# Patient Record
Sex: Male | Born: 1965 | Hispanic: No | Marital: Married | Smoking: Former smoker
Health system: Southern US, Community
[De-identification: ages and names within clinical notes are randomized; demographics above are authoritative.]

## PROBLEM LIST (undated history)

## (undated) DIAGNOSIS — E785 Hyperlipidemia, unspecified: Secondary | ICD-10-CM

## (undated) DIAGNOSIS — K219 Gastro-esophageal reflux disease without esophagitis: Secondary | ICD-10-CM

## (undated) DIAGNOSIS — I10 Essential (primary) hypertension: Secondary | ICD-10-CM

## (undated) DIAGNOSIS — H269 Unspecified cataract: Secondary | ICD-10-CM

## (undated) HISTORY — DX: Hyperlipidemia, unspecified: E78.5

## (undated) HISTORY — DX: Essential (primary) hypertension: I10

## (undated) HISTORY — DX: Unspecified cataract: H26.9

## (undated) HISTORY — DX: Gastro-esophageal reflux disease without esophagitis: K21.9

---

## 1999-04-25 DIAGNOSIS — I10 Essential (primary) hypertension: Secondary | ICD-10-CM | POA: Insufficient documentation

## 2004-04-21 ENCOUNTER — Ambulatory Visit: Payer: Self-pay | Admitting: Internal Medicine

## 2004-05-21 ENCOUNTER — Ambulatory Visit: Payer: Self-pay | Admitting: Internal Medicine

## 2004-06-03 ENCOUNTER — Ambulatory Visit: Payer: Self-pay | Admitting: *Deleted

## 2004-06-18 ENCOUNTER — Ambulatory Visit: Payer: Self-pay | Admitting: Internal Medicine

## 2004-08-05 ENCOUNTER — Ambulatory Visit: Payer: Self-pay | Admitting: Internal Medicine

## 2004-10-24 ENCOUNTER — Ambulatory Visit: Payer: Self-pay | Admitting: Internal Medicine

## 2004-11-18 ENCOUNTER — Ambulatory Visit: Payer: Self-pay | Admitting: Internal Medicine

## 2004-12-02 ENCOUNTER — Ambulatory Visit: Payer: Self-pay | Admitting: Internal Medicine

## 2005-01-13 ENCOUNTER — Ambulatory Visit: Payer: Self-pay | Admitting: Internal Medicine

## 2005-01-27 ENCOUNTER — Ambulatory Visit: Payer: Self-pay | Admitting: Internal Medicine

## 2005-02-12 ENCOUNTER — Ambulatory Visit: Payer: Self-pay | Admitting: Family Medicine

## 2005-04-28 ENCOUNTER — Ambulatory Visit: Payer: Self-pay | Admitting: Internal Medicine

## 2005-10-16 ENCOUNTER — Ambulatory Visit: Payer: Self-pay | Admitting: Internal Medicine

## 2006-01-21 ENCOUNTER — Ambulatory Visit: Payer: Self-pay | Admitting: *Deleted

## 2006-04-19 ENCOUNTER — Ambulatory Visit: Payer: Self-pay | Admitting: Internal Medicine

## 2006-08-23 ENCOUNTER — Ambulatory Visit: Payer: Self-pay | Admitting: Family Medicine

## 2007-04-25 DIAGNOSIS — E782 Mixed hyperlipidemia: Secondary | ICD-10-CM | POA: Insufficient documentation

## 2007-04-25 DIAGNOSIS — E11319 Type 2 diabetes mellitus with unspecified diabetic retinopathy without macular edema: Secondary | ICD-10-CM | POA: Insufficient documentation

## 2007-04-25 DIAGNOSIS — E785 Hyperlipidemia, unspecified: Secondary | ICD-10-CM | POA: Insufficient documentation

## 2007-04-25 DIAGNOSIS — IMO0002 Reserved for concepts with insufficient information to code with codable children: Secondary | ICD-10-CM | POA: Insufficient documentation

## 2007-04-25 DIAGNOSIS — E1165 Type 2 diabetes mellitus with hyperglycemia: Secondary | ICD-10-CM

## 2011-05-27 ENCOUNTER — Encounter: Payer: Self-pay | Admitting: Internal Medicine

## 2011-05-27 ENCOUNTER — Ambulatory Visit (INDEPENDENT_AMBULATORY_CARE_PROVIDER_SITE_OTHER): Payer: PRIVATE HEALTH INSURANCE | Admitting: Internal Medicine

## 2011-05-27 DIAGNOSIS — E119 Type 2 diabetes mellitus without complications: Secondary | ICD-10-CM

## 2011-05-27 DIAGNOSIS — I10 Essential (primary) hypertension: Secondary | ICD-10-CM

## 2011-05-27 DIAGNOSIS — E785 Hyperlipidemia, unspecified: Secondary | ICD-10-CM

## 2011-05-27 MED ORDER — INSULIN ASPART PROT & ASPART (70-30 MIX) 100 UNIT/ML ~~LOC~~ SUSP: SUBCUTANEOUS | Status: DC

## 2011-05-27 MED ORDER — LISINOPRIL 10 MG PO TABS: 10.0000 mg | ORAL_TABLET | Freq: Every day | ORAL | Status: DC

## 2011-05-27 NOTE — Patient Instructions (Signed)
I want you to start taking 15 units of insulin before breakfast,  And 25 units before dinner.     Check you sugars fasting in the morning,  And 2 hours after your dinner meal..  Start lisinopril for your blood pressure,  1 tablet daily.  Please drop you labs by as soon as you can.  Return in one month.  We will adjust your insulin dose every week based on your previous weeks sugars.

## 2011-05-27 NOTE — Progress Notes (Signed)
  Subjective:    Patient ID: Randy Grant, male    DOB: 1965/09/04, 45 y.o.   MRN: [id number]  HPI Randy Grant is a 45 yo [place] male who is here to establish primary care.  He has a history of  DM, hyperlipidemia and HTN who has not taken any of his medications for the pas t6 months, for unclear reasons.  His chroncic conditions were previously managed by [facility],  But he states that his diabetes was not controlled despite his being prescribed insulin twice daily.  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  He states that he doesn't have time to attend diabetes classes  bc he works 4PM to midnight 5 days..   Past Medical History  Diagnosis Date  . Hyperlipidemia   . Hypertension   . Diabetes mellitus     No current outpatient prescriptions on file prior to visit.      Review of Systems  Constitutional: Positive for unexpected weight change. Negative for fever, chills, diaphoresis, activity change, appetite change and fatigue.  HENT: Negative for hearing loss, ear pain, nosebleeds, congestion, sore throat, facial swelling, rhinorrhea, sneezing, drooling, mouth sores, trouble swallowing, neck pain, neck stiffness, dental problem, voice change, postnasal drip, sinus pressure, tinnitus and ear discharge.   Eyes: Negative for photophobia, pain, discharge, redness, itching and visual disturbance.  Respiratory: Negative for apnea, cough, choking, chest tightness, shortness of breath, wheezing and stridor.   Cardiovascular: Negative for chest pain, palpitations and leg swelling.  Gastrointestinal: Negative for nausea, vomiting, abdominal pain, diarrhea, constipation, blood in stool, abdominal distention, anal bleeding and rectal pain.  Genitourinary: Negative for dysuria, urgency, frequency, hematuria, flank pain, decreased urine volume, scrotal swelling, difficulty urinating and testicular pain.  Musculoskeletal: Negative for  myalgias, back pain, joint swelling, arthralgias and gait problem.  Skin: Negative for color change, rash and wound.  Neurological: Negative for dizziness, tremors, seizures, syncope, speech difficulty, weakness, light-headedness, numbness and headaches.  Psychiatric/Behavioral: Negative for suicidal ideas, hallucinations, behavioral problems, confusion, sleep disturbance, dysphoric mood, decreased concentration and agitation. The patient is not nervous/anxious.        Objective:   Physical Exam  Constitutional: He is oriented to person, place, and time. Vital signs are normal. He appears well-developed and well-nourished.  HENT:  Head: Normocephalic and atraumatic.  Mouth/Throat: Oropharynx is clear and moist.  Eyes: Conjunctivae and EOM are normal.  Neck: Normal range of motion. Neck supple. No JVD present. No thyromegaly present.  Cardiovascular: Normal rate, regular rhythm and normal heart sounds.   Pulmonary/Chest: Effort normal and breath sounds normal. He has no wheezes. He has no rales.  Abdominal: Soft. Bowel sounds are normal. He exhibits no mass. There is no tenderness. There is no rebound.  Musculoskeletal: Normal range of motion. He exhibits no edema.  Neurological: He is alert and oriented to person, place, and time.  Skin: Skin is warm and dry.  Psychiatric: He has a normal mood and affect.          Assessment & Plan:

## 2011-05-30 ENCOUNTER — Encounter: Payer: Self-pay | Admitting: Internal Medicine

## 2011-05-30 NOTE — Assessment & Plan Note (Signed)
Mildly elevated.  Resume lisinopril 10 mg daily. Return for labs next week.

## 2011-05-30 NOTE — Assessment & Plan Note (Signed)
He will return for fasting labs,  Goal is LDL < 70

## 2011-05-30 NOTE — Assessment & Plan Note (Signed)
Currently uncontrolled due to lapse in medications. His random CBG in the office today was > 220, we will resume 70/30 insulin twice daily with Flexpen sample given.  He will return in one month and check sugars 2 to 3 times daily with titrations expected.

## 2011-06-01 ENCOUNTER — Other Ambulatory Visit: Payer: Self-pay | Admitting: Internal Medicine

## 2011-06-01 MED ORDER — INSULIN PEN NEEDLE 31G X 8 MM MISC: Status: DC

## 2011-06-01 MED ORDER — FREESTYLE LANCETS MISC: Status: AC

## 2011-06-18 ENCOUNTER — Ambulatory Visit: Payer: Self-pay | Admitting: Internal Medicine

## 2011-06-26 ENCOUNTER — Ambulatory Visit (INDEPENDENT_AMBULATORY_CARE_PROVIDER_SITE_OTHER): Payer: PRIVATE HEALTH INSURANCE | Admitting: Internal Medicine

## 2011-06-26 ENCOUNTER — Encounter: Payer: Self-pay | Admitting: Internal Medicine

## 2011-06-26 DIAGNOSIS — I10 Essential (primary) hypertension: Secondary | ICD-10-CM

## 2011-06-26 DIAGNOSIS — G589 Mononeuropathy, unspecified: Secondary | ICD-10-CM

## 2011-06-26 DIAGNOSIS — E119 Type 2 diabetes mellitus without complications: Secondary | ICD-10-CM

## 2011-06-26 DIAGNOSIS — E349 Endocrine disorder, unspecified: Secondary | ICD-10-CM

## 2011-06-26 MED ORDER — GLUCOSE BLOOD VI STRP: ORAL_STRIP | Status: AC

## 2011-06-26 NOTE — Progress Notes (Signed)
Subjective:    Patient ID: Randy Grant, male    DOB: 1966/06/14, 45 y.o.   MRN: [id number]  HPI  45 yo [place] male here to followup on uncontrolled diabetes. At his visit visit he had a random cbg of over 220  And we began 70/30 insulin  for hga1cb > 8.  He was instructed to check sugars 3 times daily and bring log to visit.   He had two hypoglycemic epsiodes bs dropped to 44) which  occurred around noon.  His symptoms were feeling hungry and sweaty.  He did not call or adjust his insulin.  He drank orange juice to restore his bs to normal.  He uses 15 units of 70/30 in the morning before breakfast and 25 units before dinner. His postprandial 11 pm sugars have been elevated to 20 and his predinner blood sugars have also been elevated.  He has [facility] and several knowledge gaps were identified.  His second issue is anxiety. The source of his anxiety is concern for his parents who did not emigrate from the [place] when he did and have required increasingly more financial assistance form him in order to survive.  He is having difficulty resting at night but does not want medication. He believes that his blood sugars are elevated because of his "stress"   Review of Systems  Constitutional: Positive for unexpected weight change. Negative for fever, chills, diaphoresis, activity change, appetite change and fatigue.  HENT: Negative for hearing loss, ear pain, nosebleeds, congestion, sore throat, facial swelling, rhinorrhea, sneezing, drooling, mouth sores, trouble swallowing, neck pain, neck stiffness, dental problem, voice change, postnasal drip, sinus pressure, tinnitus and ear discharge.   Eyes: Negative for photophobia, pain, discharge, redness, itching and visual disturbance.  Respiratory: Negative for apnea, cough, choking, chest tightness, shortness of breath, wheezing and stridor.   Cardiovascular: Negative for chest pain, palpitations and leg  swelling.  Gastrointestinal: Negative for nausea, vomiting, abdominal pain, diarrhea, constipation, blood in stool, abdominal distention, anal bleeding and rectal pain.  Genitourinary: Negative for dysuria, urgency, frequency, hematuria, flank pain, decreased urine volume, scrotal swelling, difficulty urinating and testicular pain.  Musculoskeletal: Negative for myalgias, back pain, joint swelling, arthralgias and gait problem.  Skin: Negative for color change, rash and wound.  Neurological: Negative for dizziness, tremors, seizures, syncope, speech difficulty, weakness, light-headedness, numbness and headaches.  Psychiatric/Behavioral: Negative for suicidal ideas, hallucinations, behavioral problems, confusion, sleep disturbance, dysphoric mood, decreased concentration and agitation. The patient is not nervous/anxious.        Objective:   Physical Exam  Constitutional: He is oriented to person, place, and time.  HENT:  Head: Normocephalic and atraumatic.  Mouth/Throat: Oropharynx is clear and moist.  Eyes: Conjunctivae and EOM are normal.  Neck: Normal range of motion. Neck supple. No JVD present. No thyromegaly present.  Cardiovascular: Normal rate, regular rhythm and normal heart sounds.   Pulmonary/Chest: Effort normal and breath sounds normal. He has no wheezes. He has no rales.  Abdominal: Soft. Bowel sounds are normal. He exhibits no mass. There is no tenderness. There is no rebound.  Musculoskeletal: Normal range of motion. He exhibits no edema.  Neurological: He is alert and oriented to person, place, and time.  Skin: Skin is warm and dry.  Psychiatric: He has a normal mood and affect.          Assessment & Plan:  DM:  He is attending Diabetes Education classes now.  Reviewed his  diet and his curent insulin regimen and recommended that he eat more frequently to avoid hypoglycemia from entering into fasting states.

## 2011-06-26 NOTE — Patient Instructions (Addendum)
Continue  the morning insulin dose  of 15 units unless your fasting glucose is < 130. If your sugar is < 130 use only 10 units of insulin before breakfast  Increase your evening dose of insulin to 30 units    Try going for a walk every day after one of your your big meals  We will repeat your labs in early January to see how your diabetes is being managed.    We will refer you for an eye exam   I have sent your test strips to [facility] pharmacy

## 2011-06-28 ENCOUNTER — Encounter: Payer: Self-pay | Admitting: Internal Medicine

## 2011-07-11 ENCOUNTER — Ambulatory Visit: Payer: Self-pay | Admitting: Internal Medicine

## 2011-07-29 ENCOUNTER — Ambulatory Visit: Payer: PRIVATE HEALTH INSURANCE | Admitting: Internal Medicine

## 2011-08-11 ENCOUNTER — Ambulatory Visit: Payer: Self-pay | Admitting: Internal Medicine

## 2011-08-12 ENCOUNTER — Other Ambulatory Visit: Payer: Self-pay | Admitting: Internal Medicine

## 2011-08-12 LAB — COMPREHENSIVE METABOLIC PANEL
Anion Gap: 9 (ref 7–16)
BUN: 12 mg/dL (ref 7–18)
Calcium, Total: 9 mg/dL (ref 8.5–10.1)
EGFR (African American): >60
EGFR (Non-African Amer.): >60
Glucose: 166 mg/dL — ABNORMAL HIGH (ref 65–99)
Osmolality: 287 (ref 275–301)
Potassium: 4 mmol/L (ref 3.5–5.1)
SGOT(AST): 20 U/L (ref 15–37)
Sodium: 142 mmol/L (ref 136–145)

## 2011-08-12 LAB — HEMOGLOBIN A1C: Hemoglobin A1C: 9.6 % — ABNORMAL HIGH (ref 4.2–6.3)

## 2011-08-14 ENCOUNTER — Ambulatory Visit: Payer: PRIVATE HEALTH INSURANCE | Admitting: Internal Medicine

## 2011-08-17 ENCOUNTER — Ambulatory Visit: Payer: PRIVATE HEALTH INSURANCE | Admitting: Internal Medicine

## 2011-08-17 ENCOUNTER — Telehealth: Payer: Self-pay | Admitting: Internal Medicine

## 2011-08-17 NOTE — Telephone Encounter (Signed)
Pt would like to know his lab result

## 2011-08-18 NOTE — Telephone Encounter (Signed)
We have no recent labs on this patient. I called [facility] to request most recent labs, but the most recent that they had was from October, I spoke to wife and she is going to find out where he had these labs done and call us back.

## 2011-08-19 ENCOUNTER — Telehealth: Payer: Self-pay | Admitting: Internal Medicine

## 2011-08-19 NOTE — Telephone Encounter (Signed)
Lab results from [facility] are in your red folder.

## 2011-08-19 NOTE — Telephone Encounter (Signed)
Those labs were from October .  He has had insulin changes and 2 office visits since then He needs repeat labsdone prior to next visit : fasting lipids, CMEt and HgbA1c .  These will need to be done every 3 months for diabetes management.

## 2011-08-20 NOTE — Telephone Encounter (Signed)
Pt says he had labs done this week, advised him we will call him when we get results.

## 2011-08-26 ENCOUNTER — Ambulatory Visit (INDEPENDENT_AMBULATORY_CARE_PROVIDER_SITE_OTHER): Payer: PRIVATE HEALTH INSURANCE | Admitting: Internal Medicine

## 2011-08-26 ENCOUNTER — Encounter: Payer: Self-pay | Admitting: Internal Medicine

## 2011-08-26 DIAGNOSIS — E1165 Type 2 diabetes mellitus with hyperglycemia: Secondary | ICD-10-CM

## 2011-08-26 DIAGNOSIS — IMO0002 Reserved for concepts with insufficient information to code with codable children: Secondary | ICD-10-CM

## 2011-08-26 DIAGNOSIS — I10 Essential (primary) hypertension: Secondary | ICD-10-CM

## 2011-08-26 DIAGNOSIS — E119 Type 2 diabetes mellitus without complications: Secondary | ICD-10-CM

## 2011-08-26 NOTE — Progress Notes (Signed)
Subjective:    Patient ID: Randy Grant, male    DOB: Aug 29, 1965, 46 y.o.   MRN: [id number]  HPI Randy Grant returns for one month 0diabetes checkup due to changes in medication at last visit.  His cbgs have decreased on the new regimen. On his current regimen of 70/30 insulin 15 units in the morning and 30 units in the evening pre meal, his fasting blood sugars have averaged between 130 and 150 his evening blood sugars have been averaging in the 120s to 150s.  He has not reported any recent hypoglycemic events. He is up-to-date on his diabetic eye exams and is actually scheduled for surgery for management of retinopathy in late January.  Past Medical History  Diagnosis Date  . Hyperlipidemia   . Hypertension   . Diabetes mellitus    Current Outpatient Prescriptions on File Prior to Visit  Medication Sig Dispense Refill  . aspirin 81 MG tablet Take 81 mg by mouth daily.        Marland Kitchen glucose blood test strip Use as instructed  100 each  12  . Insulin Pen Needle 31G X 8 MM MISC Test blood sugars two times daily  100 each  5  . Lancets (FREESTYLE) lancets Test blood sugars two times daily  100 each  5  . lisinopril (PRINIVIL,ZESTRIL) 10 MG tablet Take 1 tablet (10 mg total) by mouth daily.  30 tablet  11  . Multiple Vitamin (MULTIVITAMIN) tablet Take 1 tablet by mouth daily.          Review of Systems  Constitutional: Negative for fever, chills, diaphoresis, activity change, appetite change, fatigue and unexpected weight change.  HENT: Negative for hearing loss, ear pain, nosebleeds, congestion, sore throat, facial swelling, rhinorrhea, sneezing, drooling, mouth sores, trouble swallowing, neck pain, neck stiffness, dental problem, voice change, postnasal drip, sinus pressure, tinnitus and ear discharge.   Eyes: Negative for photophobia, pain, discharge, redness, itching and visual disturbance.  Respiratory: Negative for apnea, cough, choking, chest tightness, shortness of breath, wheezing  and stridor.   Cardiovascular: Negative for chest pain, palpitations and leg swelling.  Gastrointestinal: Negative for nausea, vomiting, abdominal pain, diarrhea, constipation, blood in stool, abdominal distention, anal bleeding and rectal pain.  Genitourinary: Negative for dysuria, urgency, frequency, hematuria, flank pain, decreased urine volume, scrotal swelling, difficulty urinating and testicular pain.  Musculoskeletal: Negative for myalgias, back pain, joint swelling, arthralgias and gait problem.  Skin: Negative for color change, rash and wound.  Neurological: Negative for dizziness, tremors, seizures, syncope, speech difficulty, weakness, light-headedness, numbness and headaches.  Psychiatric/Behavioral: Negative for suicidal ideas, hallucinations, behavioral problems, confusion, sleep disturbance, dysphoric mood, decreased concentration and agitation. The patient is not nervous/anxious.        Objective:   Physical Exam  Constitutional: He is oriented to person, place, and time.  HENT:  Head: Normocephalic and atraumatic.  Mouth/Throat: Oropharynx is clear and moist.  Eyes: Conjunctivae and EOM are normal.  Neck: Normal range of motion. Neck supple. No JVD present. No thyromegaly present.  Cardiovascular: Normal rate, regular rhythm and normal heart sounds.   Pulmonary/Chest: Effort normal and breath sounds normal. He has no wheezes. He has no rales.  Abdominal: Soft. Bowel sounds are normal. He exhibits no mass. There is no tenderness. There is no rebound.  Musculoskeletal: Normal range of motion. He exhibits no edema.  Neurological: He is alert and oriented to person, place, and time.  Skin: Skin is warm and dry.  Psychiatric: He has a normal  mood and affect.       Assessment & Plan:   Diabetes mellitus type 2 with complications, uncontrolled No changes were made today to his insulin regimen given his report of improving glycemic control. He is due for hemoglobin A1c and  this has been done at an outside facility and I am waiting for the results. He is receiving I care for new-onset retinopathy and is scheduled for surgery later on in the month. Fasting lipids are also do with next evaluation and urine microalbumin to creatinine ratio as well.  HYPERTENSION, BENIGN ESSENTIAL Well-controlled on current regimen of lisinopril. No changes today. He has normal renal function by outside labs dated October 2012.    Updated Medication List Outpatient Encounter Prescriptions as of 08/26/2011  Medication Sig Dispense Refill  . aspirin 81 MG tablet Take 81 mg by mouth daily.        Marland Kitchen glucose blood test strip Use as instructed  100 each  12  . Insulin Aspart Prot & Aspart (NOVOLOG MIX 70/30 FLEXPEN [place]) Inject 15 units am, 30 units pm.      . Insulin Pen Needle 31G X 8 MM MISC Test blood sugars two times daily  100 each  5  . Lancets (FREESTYLE) lancets Test blood sugars two times daily  100 each  5  . lisinopril (PRINIVIL,ZESTRIL) 10 MG tablet Take 1 tablet (10 mg total) by mouth daily.  30 tablet  11  . Multiple Vitamin (MULTIVITAMIN) tablet Take 1 tablet by mouth daily.

## 2011-08-26 NOTE — Patient Instructions (Addendum)
Continue 15 units of insulin in the morning and 30 units in the evening..   We will schedule your next appt in the morning in 3 months so you can get your labs done the same day   You are welcome to submit your blood sugar log every few weeks and I will review and offer advice.

## 2011-08-28 DIAGNOSIS — E118 Type 2 diabetes mellitus with unspecified complications: Secondary | ICD-10-CM | POA: Insufficient documentation

## 2011-08-28 DIAGNOSIS — IMO0002 Reserved for concepts with insufficient information to code with codable children: Secondary | ICD-10-CM | POA: Insufficient documentation

## 2011-08-28 DIAGNOSIS — E1165 Type 2 diabetes mellitus with hyperglycemia: Secondary | ICD-10-CM | POA: Insufficient documentation

## 2011-08-28 NOTE — Assessment & Plan Note (Signed)
No changes were made today to his insulin regimen given his report of improving glycemic control. He is due for hemoglobin A1c and this has been done at an outside facility and I am waiting for the results. He is receiving I care for new-onset retinopathy and is scheduled for surgery later on in the month. Fasting lipids are also do with next evaluation and urine microalbumin to creatinine ratio as well.

## 2011-08-28 NOTE — Assessment & Plan Note (Signed)
Well-controlled on current regimen of lisinopril. No changes today. He has normal renal function by outside labs dated October 2012.

## 2011-08-28 NOTE — Telephone Encounter (Signed)
Error

## 2011-09-11 ENCOUNTER — Ambulatory Visit: Payer: Self-pay | Admitting: Internal Medicine

## 2011-10-12 ENCOUNTER — Telehealth: Payer: Self-pay | Admitting: Internal Medicine

## 2011-10-12 NOTE — Telephone Encounter (Signed)
I have his blood sugar log up until Feb 4th but am still waiting for his January lab results which were done outside of [facility].  His hgba1c, CMet. ect   I cannot adjust his insulin regimen without it.  Can you please ask his wife or him where they were done so we can get them , or if they have a copy,  Bring it by?  thanks

## 2011-10-13 NOTE — Telephone Encounter (Signed)
Patients wife stated the labs were done at the hospital.  The labs have been requested.

## 2011-10-14 NOTE — Telephone Encounter (Signed)
hGBa1C AND BLOOD SUGAR LOG REVIEWED,.  Please confirm what time he takes his 70/30 doses and the dose because he works 3rd shift

## 2011-10-14 NOTE — Telephone Encounter (Signed)
The labs are in your green folder

## 2011-10-15 ENCOUNTER — Encounter: Payer: Self-pay | Admitting: Internal Medicine

## 2011-10-15 NOTE — Telephone Encounter (Signed)
Left message asking patient to call the office.  

## 2011-10-16 NOTE — Telephone Encounter (Signed)
Patient stated he takes his 70/30 dose at 1 am.

## 2011-10-19 NOTE — Telephone Encounter (Signed)
Have him increase his morning dose of 70/30 by 3 units

## 2011-10-20 NOTE — Telephone Encounter (Signed)
Patient notified

## 2011-10-22 ENCOUNTER — Ambulatory Visit: Payer: PRIVATE HEALTH INSURANCE | Admitting: Internal Medicine

## 2011-10-26 ENCOUNTER — Ambulatory Visit: Payer: PRIVATE HEALTH INSURANCE | Admitting: Internal Medicine

## 2011-10-27 ENCOUNTER — Ambulatory Visit (INDEPENDENT_AMBULATORY_CARE_PROVIDER_SITE_OTHER): Payer: PRIVATE HEALTH INSURANCE | Admitting: Internal Medicine

## 2011-10-27 ENCOUNTER — Encounter: Payer: Self-pay | Admitting: Internal Medicine

## 2011-10-27 VITALS — BP 120/80 | HR 80 | Temp 97.6°F | Wt 157.8 lb

## 2011-10-27 DIAGNOSIS — M7730 Calcaneal spur, unspecified foot: Secondary | ICD-10-CM

## 2011-10-27 DIAGNOSIS — M79609 Pain in unspecified limb: Secondary | ICD-10-CM

## 2011-10-27 DIAGNOSIS — M545 Low back pain, unspecified: Secondary | ICD-10-CM | POA: Insufficient documentation

## 2011-10-27 DIAGNOSIS — IMO0002 Reserved for concepts with insufficient information to code with codable children: Secondary | ICD-10-CM

## 2011-10-27 DIAGNOSIS — M79671 Pain in right foot: Secondary | ICD-10-CM | POA: Insufficient documentation

## 2011-10-27 DIAGNOSIS — S239XXA Sprain of unspecified parts of thorax, initial encounter: Secondary | ICD-10-CM

## 2011-10-27 MED ORDER — MELOXICAM 15 MG PO TABS: 15.0000 mg | ORAL_TABLET | Freq: Every day | ORAL | Status: DC

## 2011-10-27 NOTE — Assessment & Plan Note (Signed)
He has no evidence of herniated disc.  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 Initiate trial of tamoxifen and Tylenol and critical strenuous activities for the next week or 2.

## 2011-10-27 NOTE — Patient Instructions (Signed)
You have strained the muscles in your back .  This should resolve with use of an anti  inflammatory called meloxicam along with a pain reliever (Tylenol).  Take one meloxicam daily,  Take 2 tylenol every 12 hours as needed.    Avoid strenuous activity for one week.   Do not use ibuprofen ,  The meloxicam is taking ist place,   Your heel may have a bone  spur.  We are getting x rays at [facility] office of [place] on [address] [place].

## 2011-10-27 NOTE — Progress Notes (Signed)
Patient ID: Randy Grant, male   DOB: 06-29-1966, 46 y.o.   MRN: [id number]    Patient Active Problem List  Diagnoses  . Diabetes mellitus type 2 with complications, uncontrolled  . DYSLIPIDEMIA  . HYPERTENSION, BENIGN ESSENTIAL  . Right foot pain  . Back sprain/strain, thoracic    Subjective:  CC:   Chief Complaint  Patient presents with  . Foot Pain    right  . Back Pain    lower right     HPI:   Randy E Elhussainis a 46 y.o. male who presents with right heel pain for one week,  imporved with change of shoes and staying off feet .  Wears tennis shoes on a cement floor  In housekeeping .  No trauma.  No bite or infection .    Second issue is right thoracic back pain.  Has been persistent for one week.  Muscles feel tight ,  Pain with lateral rotation  but not elicited with bending or flexing.  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   pain does not radiate to either buttock and there are no areas of paresthesias or numbness. Pain did not precede an episode of coughing and .  He denies nausea vomiting increased urinary frequency and fevers.      The following portions of the patient's history were reviewed and updated as appropriate: Allergies, current medications, and problem list.  Past Medical History  Diagnosis Date  . Hyperlipidemia   . Hypertension   . Diabetes mellitus     No past surgical history on file.  Current Outpatient Prescriptions on File Prior to Visit  Medication Sig Dispense Refill  . aspirin 81 MG tablet Take 81 mg by mouth daily.        Marland Kitchen glucose blood test strip Use as instructed  100 each  12  . Insulin Aspart Prot & Aspart (NOVOLOG MIX 70/30 FLEXPEN [place]) Inject 15 units am, 30 units pm.      . Insulin Pen Needle 31G X 8 MM MISC Test blood sugars two times daily  100 each  5  . Lancets (FREESTYLE) lancets Test  blood sugars two times daily  100 each  5  . lisinopril (PRINIVIL,ZESTRIL) 10 MG tablet Take 1 tablet (10 mg total) by mouth daily.  30 tablet  11  . Multiple Vitamin (MULTIVITAMIN) tablet Take 1 tablet by mouth daily.           Review of Systems:  12 point review of systems is negative except what is addressed in the HPI,       Objective:  BP 120/80  Pulse 80  Temp(Src) 97.6 F (36.4 C) (Oral)  Wt 157 lb 12.8 oz (71.578 kg)  General:   BP 120/80  Pulse 80  Temp(Src) 97.6 F (36.4 C) (Oral)  Wt 157 lb 12.8 oz (71.578 kg)  General Appearance:    Alert, cooperative, no distress, appears stated age  Head:    Normocephalic, without obvious abnormality, atraumatic  Eyes:    PERRL, conjunctiva/corneas clear, EOM's intact, both eyes       Ears:    Normal TM's and external ear canals, both ears  Nose:   Nares normal, septum midline, mucosa normal, no drainage   or sinus tenderness  Throat:   Lips, mucosa, and tongue normal; teeth and gums normal  Neck:  Supple, symmetrical, trachea midline, no adenopathy;       thyroid:  No enlargement/tenderness/nodules; no carotid   bruit or JVD  Back:     Symmetric, no curvature, ROM normal, no CVA tenderness  Lungs:     Clear to auscultation bilaterally, respirations unlabored  Chest wall:    No tenderness or deformity  Heart:    Regular rate and rhythm, S1 and S2 normal, no murmur, rub   or gallop  Abdomen:     Soft, non-tender, bowel sounds active all four quadrants,    no masses, no organomegaly  Extremities:   Extremities normal, atraumatic, no cyanosis or edema  Pulses:   2+ and symmetric all extremities  Skin:   Skin color, texture, turgor normal, no rashes or lesions  Lymph nodes:   Cervical, supraclavicular, and axillary nodes normal  Neurologic:   CNII-XII intact. Normal strength, sensation and reflexes      throughout   Assessment and Plan:    Right foot pain He has no signs of tendinitis on exam. His exam is  relatively normal again there are no signs of synovitis or gouty flares. I suspect his pain is coming from a calcaneal bone spur and sent him for x-rays of the right heel and complete foot. Will also recommend a   Back sprain/strain, thoracic He has no evidence of herniated disc.  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 Initiate trial of tamoxifen and Tylenol and critical strenuous activities for the next week or 2.    Updated Medication List Outpatient Encounter Prescriptions as of 10/27/2011  Medication Sig Dispense Refill  . aspirin 81 MG tablet Take 81 mg by mouth daily.        Marland Kitchen glucose blood test strip Use as instructed  100 each  12  . Insulin Aspart Prot & Aspart (NOVOLOG MIX 70/30 FLEXPEN [place]) Inject 15 units am, 30 units pm.      . Insulin Pen Needle 31G X 8 MM MISC Test blood sugars two times daily  100 each  5  . Lancets (FREESTYLE) lancets Test blood sugars two times daily  100 each  5  . lisinopril (PRINIVIL,ZESTRIL) 10 MG tablet Take 1 tablet (10 mg total) by mouth daily.  30 tablet  11  . Multiple Vitamin (MULTIVITAMIN) tablet Take 1 tablet by mouth daily.        . meloxicam (MOBIC) 15 MG tablet Take 1 tablet (15 mg total) by mouth daily.  30 tablet  1    Orders Placed This Encounter  Procedures  . DG Foot Complete Right    No Follow-up on file.

## 2011-10-27 NOTE — Assessment & Plan Note (Signed)
He has no signs of tendinitis on exam. His exam is relatively normal again there are no signs of synovitis or gouty flares. I suspect his pain is coming from a calcaneal bone spur and sent him for x-rays of the right heel and complete foot. Will also recommend a

## 2011-11-11 ENCOUNTER — Ambulatory Visit (INDEPENDENT_AMBULATORY_CARE_PROVIDER_SITE_OTHER)
Admission: RE | Admit: 2011-11-11 | Discharge: 2011-11-11 | Disposition: A | Discharge status: Home / Self Care | Payer: PRIVATE HEALTH INSURANCE | Source: Ambulatory Visit | Attending: Internal Medicine | Admitting: Internal Medicine

## 2011-11-11 DIAGNOSIS — M7730 Calcaneal spur, unspecified foot: Secondary | ICD-10-CM

## 2011-11-18 ENCOUNTER — Encounter: Payer: Self-pay | Admitting: Internal Medicine

## 2011-11-23 ENCOUNTER — Telehealth: Payer: Self-pay | Admitting: *Deleted

## 2011-11-23 DIAGNOSIS — M7730 Calcaneal spur, unspecified foot: Secondary | ICD-10-CM

## 2011-11-23 NOTE — Telephone Encounter (Signed)
Referral

## 2011-11-25 ENCOUNTER — Other Ambulatory Visit (INDEPENDENT_AMBULATORY_CARE_PROVIDER_SITE_OTHER): Payer: PRIVATE HEALTH INSURANCE | Admitting: *Deleted

## 2011-11-25 ENCOUNTER — Telehealth: Payer: Self-pay | Admitting: *Deleted

## 2011-11-25 DIAGNOSIS — E785 Hyperlipidemia, unspecified: Secondary | ICD-10-CM

## 2011-11-25 DIAGNOSIS — I10 Essential (primary) hypertension: Secondary | ICD-10-CM

## 2011-11-25 DIAGNOSIS — E119 Type 2 diabetes mellitus without complications: Secondary | ICD-10-CM

## 2011-11-25 LAB — LIPID PANEL: Cholesterol: 260 mg/dL — ABNORMAL HIGH (ref 0–200)

## 2011-11-25 LAB — COMPREHENSIVE METABOLIC PANEL
Albumin: 3.8 g/dL (ref 3.5–5.2)
BUN: 13 mg/dL (ref 6–23)
CO2: 24 mEq/L (ref 19–32)
GFR: 144.65 mL/min (ref >60.00)
Glucose, Bld: 242 mg/dL — ABNORMAL HIGH (ref 70–99)
Potassium: 4.1 mEq/L (ref 3.5–5.1)
Sodium: 135 mEq/L (ref 135–145)
Total Bilirubin: 0.4 mg/dL (ref 0.3–1.2)
Total Protein: 6.8 g/dL (ref 6.0–8.3)

## 2011-11-25 NOTE — Telephone Encounter (Signed)
Labs ordered.

## 2011-11-25 NOTE — Telephone Encounter (Signed)
Hgba1c, CMEt, fasting lipids, .  thanks

## 2011-11-25 NOTE — Telephone Encounter (Signed)
Patient came in for labs today, but there is no order what would you like done?

## 2011-11-27 ENCOUNTER — Encounter: Payer: Self-pay | Admitting: *Deleted

## 2011-11-30 ENCOUNTER — Encounter: Payer: Self-pay | Admitting: Internal Medicine

## 2011-11-30 ENCOUNTER — Ambulatory Visit (INDEPENDENT_AMBULATORY_CARE_PROVIDER_SITE_OTHER): Payer: PRIVATE HEALTH INSURANCE | Admitting: Internal Medicine

## 2011-11-30 VITALS — BP 140/82 | HR 91 | Temp 98.3°F | Resp 16 | Wt 158.8 lb

## 2011-11-30 DIAGNOSIS — E785 Hyperlipidemia, unspecified: Secondary | ICD-10-CM

## 2011-11-30 DIAGNOSIS — E119 Type 2 diabetes mellitus without complications: Secondary | ICD-10-CM

## 2011-11-30 DIAGNOSIS — M79609 Pain in unspecified limb: Secondary | ICD-10-CM

## 2011-11-30 DIAGNOSIS — IMO0002 Reserved for concepts with insufficient information to code with codable children: Secondary | ICD-10-CM

## 2011-11-30 DIAGNOSIS — E1165 Type 2 diabetes mellitus with hyperglycemia: Secondary | ICD-10-CM

## 2011-11-30 DIAGNOSIS — M79671 Pain in right foot: Secondary | ICD-10-CM

## 2011-11-30 MED ORDER — INSULIN ASPART PROT & ASPART (70-30 MIX) 100 UNIT/ML ~~LOC~~ SUSP: SUBCUTANEOUS | Status: DC

## 2011-11-30 MED ORDER — INSULIN ASPART 100 UNIT/ML ~~LOC~~ SOLN: SUBCUTANEOUS | Status: DC

## 2011-11-30 NOTE — Assessment & Plan Note (Signed)
We are deferring medication management of his hyperlipidemia until the next three-month followup , as I  am hopeful that his lipids will improve once his diabetes is better managed.

## 2011-11-30 NOTE — Progress Notes (Signed)
Patient ID: Randy Grant, male   DOB: [birth year], 46 y.o.   MRN: [id number]   Patient Active Problem List  Diagnoses  . Diabetes mellitus type 2 with complications, uncontrolled  . DYSLIPIDEMIA  . HYPERTENSION, BENIGN ESSENTIAL  . Right foot pain  . Back sprain/strain, thoracic    Subjective:  CC:   Chief Complaint  Patient presents with  . Follow-up    HPI:   Randy Grant a 46 y.o. male who presents For follow up on uncontrolled diabetes.  His January hgba1c was 9,6 and repeat last week was 9.3 , despite patient reporting improvement in blood sugars.  He has been taking 15 untis of 70/30 in the morning and 30 units in the evening.  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  His second issue is his recent development of right heel pain secondary to bone spur, leading to an eval by Dr. Al Corpus.   He  received a steroid injection in heel,  and has followup this week. He reports that his heel pain is improved in the morning but becomes uncomfortable again by the end of the day due to prolonged standing and walking.  He did note that after his steroid injection her shirt his sugars were quite elevated at 300/400 range for a week.   Past Medical History  Diagnosis Date  . Hyperlipidemia   . Hypertension   . Diabetes mellitus     History reviewed. No pertinent past surgical history.       The following portions of the patient's history were reviewed and updated as appropriate: Allergies, current medications, and problem list.    Review of Systems:   12 Pt  review of systems was negative except those addressed in the HPI,     History   Social History  . Marital Status: Married    Spouse Name: N/A    Number of Children: N/A  . Years of Education: N/A   Occupational History    . Not on file.   Social History Main Topics  . Smoking status: Former Smoker    Quit date: 06/25/2001  . Smokeless tobacco: Never Used  . Alcohol Use: No  . Drug Use: No  . Sexually Active: Not on file   Other Topics Concern  . Not on file   Social History Narrative  . No narrative on file    Objective:  BP 140/82  Pulse 91  Temp(Src) 98.3 F (36.8 C) (Oral)  Resp 16  Wt 158 lb 12 oz (72.009 kg)  SpO2 100%  General appearance: alert, cooperative and appears stated age Ears: normal TM's and external ear canals both ears Throat: lips, mucosa, and tongue normal; teeth and gums normal Neck: no adenopathy, no carotid bruit, supple, symmetrical, trachea midline and thyroid not enlarged, symmetric, no tenderness/mass/nodules Back: symmetric, no curvature. ROM normal. No CVA tenderness. Lungs: clear to auscultation bilaterally Heart: regular rate and rhythm, S1, S2 normal, no murmur, click, rub or gallop Abdomen: soft, non-tender; bowel sounds normal; no masses,  no organomegaly Pulses: 2+ and symmetric Skin: Skin color, texture, turgor normal. No rashes or lesions Lymph nodes: Cervical, supraclavicular, and axillary nodes normal.  Assessment and Plan:  Diabetes mellitus type 2 with complications, uncontrolled His remains uncontrolled for multiple reasons. His recent elevation in sugars is attributed  to his recent steroid injection. However his diuretic he had stated his third shift work schedule has made using 7030 problematic. I have added NovoLog short acting insulin for him to use prior to his smaller meals and reduce his in his 70/30  doses slightly to avoid hypoglycemic events which he is having after prolonged fasts . He will return in one month with a log of his sugars.  30 minutes was spent with patient today reviewing his diabetes management.   DYSLIPIDEMIA We are deferring medication management of his hyperlipidemia until the next three-month followup , as I  am  hopeful that his lipids will improve once his diabetes is better managed.  Right foot pain Secondary to bone spur. Considerable relief after steroid injection by Dr. Al Corpus. However this did cause elevation of his blood sugars for at least a week. Have recommended that he ask Dr. Al Corpus for an orthotic or a gel insert for added comfort.    Updated Medication List Outpatient Encounter Prescriptions as of 11/30/2011  Medication Sig Dispense Refill  . aspirin 81 MG tablet Take 81 mg by mouth daily.        Marland Kitchen glucose blood test strip Use as instructed  100 each  12  . Insulin Aspart Prot & Aspart (NOVOLOG MIX 70/30 FLEXPEN [place]) Inject 15 units am, 30 units pm.      . Insulin Pen Needle 31G X 8 MM MISC Test blood sugars two times daily  100 each  5  . Lancets (FREESTYLE) lancets Test blood sugars two times daily  100 each  5  . lisinopril (PRINIVIL,ZESTRIL) 10 MG tablet Take 1 tablet (10 mg total) by mouth daily.  30 tablet  11  . Multiple Vitamin (MULTIVITAMIN) tablet Take 1 tablet by mouth daily.        . insulin aspart (NOVOLOG FLEXPEN) 100 UNIT/ML injection Inject before meal based on sliding scale  1 vial  12  . insulin aspart protamine-insulin aspart (NOVOLOG MIX 70/30 FLEXPEN) (70-30) 100 UNIT/ML injection 12units before mornign meal ,  25 before evening meal,  To be adjusted weekly.  10 mL  12  . DISCONTD: insulin aspart protamine-insulin aspart (NOVOLOG MIX 70/30 FLEXPEN) (70-30) 100 UNIT/ML injection 15 units before breakfast ,  25 before dinner,  To be adjusted weekly.  10 mL  12  . DISCONTD: meloxicam (MOBIC) 15 MG tablet Take 1 tablet (15 mg total) by mouth daily.  30 tablet  1     No orders of the defined types were placed in this encounter.    Return in about 1 month (around 12/30/2011).

## 2011-11-30 NOTE — Assessment & Plan Note (Addendum)
His remains uncontrolled for multiple reasons. His recent elevation in sugars is attributed to his recent steroid injection. However his diuretic he had stated his third shift work schedule has made using 7030 problematic. I have added NovoLog short acting insulin for him to use prior to his smaller meals and reduce his in his 70/30  doses slightly to avoid hypoglycemic events which he is having after prolonged fasts . He will return in one month with a log of his sugars.  30 minutes was spent with patient today reviewing his diabetes management.

## 2011-11-30 NOTE — Assessment & Plan Note (Signed)
Secondary to bone spur. Considerable relief after steroid injection by Dr. Al Corpus. However this did cause elevation of his blood sugars for at least a week. Have recommended that he ask Dr. Al Corpus for an orthotic or a gel insert for added comfort.

## 2011-11-30 NOTE — Patient Instructions (Addendum)
We are changing your insulin type to Novolog (short acting)  Before  your light meals using the following schedule:   Take 2 units if pre meal sugar is < 140.  Add 5 units if meal has bread, or pasta or potatoe or rice. (Inlcuded breading on meats/cutlets)  Take 4 units if pre meal sugars is 140 to 180  Take 6 units for pre meal sugar 181 to 220.   Decrease your  70/30 to 25 units before your biggest meal.     Decrease your morning fose of 70/30 to 12 units before breakfast  .

## 2011-12-30 ENCOUNTER — Encounter: Payer: Self-pay | Admitting: Internal Medicine

## 2011-12-30 ENCOUNTER — Ambulatory Visit (INDEPENDENT_AMBULATORY_CARE_PROVIDER_SITE_OTHER): Payer: PRIVATE HEALTH INSURANCE | Admitting: Internal Medicine

## 2011-12-30 VITALS — BP 124/72 | HR 79 | Temp 98.4°F | Resp 14 | Wt 156.5 lb

## 2011-12-30 DIAGNOSIS — E1165 Type 2 diabetes mellitus with hyperglycemia: Secondary | ICD-10-CM

## 2011-12-30 DIAGNOSIS — M79671 Pain in right foot: Secondary | ICD-10-CM

## 2011-12-30 DIAGNOSIS — IMO0002 Reserved for concepts with insufficient information to code with codable children: Secondary | ICD-10-CM

## 2011-12-30 DIAGNOSIS — M79609 Pain in unspecified limb: Secondary | ICD-10-CM

## 2011-12-30 DIAGNOSIS — E118 Type 2 diabetes mellitus with unspecified complications: Secondary | ICD-10-CM

## 2011-12-30 MED ORDER — TRAMADOL HCL 50 MG PO TABS: 50.0000 mg | ORAL_TABLET | Freq: Four times a day (QID) | ORAL | Status: AC | PRN

## 2011-12-30 NOTE — Patient Instructions (Addendum)
Increase the 70/30 dose before your biggest meal to 28 units.  And increase the morning dose  to 15 units.    Add a dose of the short acting insulin before your PM meal according to the sliding scale   We will add tramadol for foot pain,  50 mg every 6 hours as needed for pain

## 2011-12-30 NOTE — Progress Notes (Signed)
Patient ID: Randy Grant, male   DOB: Jan 31, 1966, 46 y.o.   MRN: [id number]  Patient Active Problem List  Diagnoses  . Diabetes mellitus type 2 with complications, uncontrolled  . DYSLIPIDEMIA  . HYPERTENSION, BENIGN ESSENTIAL  . Right foot pain  . Back sprain/strain, thoracic    Subjective:  CC:   Chief Complaint  Patient presents with  . Follow-up    HPI:   Randy Nealy Elhussainis a 46 y.o. male who presents follow up on uncontrolled diabetes.  For the last month he has been using 25 units of 70/30 before his largest meal and 12 of 70/30 before his smaller meal along with  low dose novolog for sliding scale in between.  His last A1cwas 9.3 his  lowest sugar was 86  But most are in the 170 range.  His 3rd shift work schedule is making control very difficult,  Comes home at 3 am and eats then,  And he will be observing  Ramadan from mid July to mid August, so he will be fasting from 3 am to sunset (13 hours daily).       Past Medical History  Diagnosis Date  . Hyperlipidemia   . Hypertension   . Diabetes mellitus     No past surgical history on file.       The following portions of the patient's history were reviewed and updated as appropriate: Allergies, current medications, and problem list.    Review of Systems:   12 Pt  review of systems was negative except those addressed in the HPI,     History   Social History  . Marital Status: Married    Spouse Name: N/A    Number of Children: N/A  . Years of Education: N/A   Occupational History  . Not on file.   Social History Main Topics  . Smoking status: Former Smoker    Quit date: 06/25/2001  . Smokeless tobacco: Never Used  . Alcohol Use: No  . Drug Use: No  . Sexually Active: Not on file   Other Topics Concern  . Not on file   Social History Narrative  . No narrative on file    Objective:  BP 124/72  Pulse 79  Temp(Src) 98.4 F (36.9 C) (Oral)  Resp 14  Wt 156 lb 8 oz (70.988 kg)   SpO2 97%  General appearance: alert, cooperative and appears stated age Ears: normal TM's and external ear canals both ears Throat: lips, mucosa, and tongue normal; teeth and gums normal Neck: no adenopathy, no carotid bruit, supple, symmetrical, trachea midline and thyroid not enlarged, symmetric, no tenderness/mass/nodules Back: symmetric, no curvature. ROM normal. No CVA tenderness. Lungs: clear to auscultation bilaterally Heart: regular rate and rhythm, S1, S2 normal, no murmur, click, rub or gallop Abdomen: soft, non-tender; bowel sounds normal; no masses,  no organomegaly Pulses: 2+ and symmetric Skin: Skin color, texture, turgor normal. No rashes or lesions Lymph nodes: Cervical, supraclavicular, and axillary nodes normal.  Assessment and Plan:  Diabetes mellitus type 2 with complications, uncontrolled Control reamin inadequate due to diet and eating schedule.  Today I advised him to increase the 70/30 dose before his biggest meal to 28 units.  And increase the morning dose  to 15 units. I also recommended that he add a dose of the short acting insulin before his small evening meal according to the sliding scale     Right foot pain Secondary to bone spur and fasciitis.  Did not respond  adequately to podiatry steroid injection. We will add tramadol for foot pain,  50 mg every 6 hours as needed for pain      Updated Medication List Outpatient Encounter Prescriptions as of 12/30/2011  Medication Sig Dispense Refill  . aspirin 81 MG tablet Take 81 mg by mouth daily.        Marland Kitchen glucose blood test strip Use as instructed  100 each  12  . insulin aspart (NOVOLOG FLEXPEN) 100 UNIT/ML injection Inject before meal based on sliding scale  1 vial  12  . insulin aspart protamine-insulin aspart (NOVOLOG MIX 70/30 FLEXPEN) (70-30) 100 UNIT/ML injection 12units before mornign meal ,  25 before evening meal,  To be adjusted weekly.  10 mL  12  . Insulin Pen Needle 31G X 8 MM MISC Test blood  sugars two times daily  100 each  5  . Lancets (FREESTYLE) lancets Test blood sugars two times daily  100 each  5  . lisinopril (PRINIVIL,ZESTRIL) 10 MG tablet Take 1 tablet (10 mg total) by mouth daily.  30 tablet  11  . Multiple Vitamin (MULTIVITAMIN) tablet Take 1 tablet by mouth daily.        . traMADol (ULTRAM) 50 MG tablet Take 1 tablet (50 mg total) by mouth every 6 (six) hours as needed for pain.  90 tablet  3  . DISCONTD: Insulin Aspart Prot & Aspart (NOVOLOG MIX 70/30 FLEXPEN [place]) Inject 15 units am, 30 units pm.         No orders of the defined types were placed in this encounter.    Return in about 1 day (around 12/31/2011).

## 2012-01-03 NOTE — Assessment & Plan Note (Signed)
Control reamin inadequate due to diet and eating schedule.  Today I advised him to increase the 70/30 dose before his biggest meal to 28 units.  And increase the morning dose  to 15 units. I also recommended that he add a dose of the short acting insulin before his small evening meal according to the sliding scale

## 2012-01-03 NOTE — Assessment & Plan Note (Signed)
Secondary to bone spur and fasciitis.  Did not respond adequately to podiatry steroid injection. We will add tramadol for foot pain,  50 mg every 6 hours as needed for pain

## 2012-02-10 ENCOUNTER — Encounter: Payer: Self-pay | Admitting: Internal Medicine

## 2012-02-10 ENCOUNTER — Ambulatory Visit (INDEPENDENT_AMBULATORY_CARE_PROVIDER_SITE_OTHER): Payer: PRIVATE HEALTH INSURANCE | Admitting: Internal Medicine

## 2012-02-10 VITALS — BP 120/72 | HR 100 | Temp 98.3°F | Resp 16 | Wt 156.5 lb

## 2012-02-10 DIAGNOSIS — E1165 Type 2 diabetes mellitus with hyperglycemia: Secondary | ICD-10-CM

## 2012-02-10 DIAGNOSIS — IMO0002 Reserved for concepts with insufficient information to code with codable children: Secondary | ICD-10-CM

## 2012-02-10 DIAGNOSIS — E118 Type 2 diabetes mellitus with unspecified complications: Secondary | ICD-10-CM

## 2012-02-10 MED ORDER — INSULIN GLARGINE 100 UNIT/ML ~~LOC~~ SOLN: SUBCUTANEOUS | Status: DC

## 2012-02-10 MED ORDER — INSULIN ASPART 100 UNIT/ML ~~LOC~~ SOLN: SUBCUTANEOUS | Status: DC

## 2012-02-10 NOTE — Patient Instructions (Addendum)
During Ramadan we will change your insulin to the following regimen:  30 units of lantus daily  5, 10, or 15 units of Novolog before your meal depending on'  Your pre meal blood sugar :   100 or less : 0 units   101 to 150:  5 units plus 5 if there is a starch,  0 if no starch in your meal  151 to 200:  8 units plus 5 if there is a starch, 0 of no starch in your meal  201 to 250:  10 units plus 5 or zero  (>250 but < 300,  Take 15 units plus 10 if there is a starch, 5 if no starch in your meal)  >300 but < 350 Take 15 units plus 10 if there is a starch, 5 if no starch

## 2012-02-10 NOTE — Progress Notes (Signed)
Patient ID: Randy Grant, male   DOB: 02-29-1966, 46 y.o.   MRN: [id number]   Patient Active Problem List  Diagnosis  . Diabetes mellitus type 2 with complications, uncontrolled  . DYSLIPIDEMIA  . HYPERTENSION, BENIGN ESSENTIAL  . Right foot pain  . Back sprain/strain, thoracic    Subjective:  CC:   Chief Complaint  Patient presents with  . Follow-up    HPI:   Randy Freer Elhussainis a 46 y.o. male who presents for follow up on uncontrolled diabetes.  We made changes to his insulin regimen at his last visit 6 weeks ago and he states that for the last dew weeks his blood sugars have improved.,  Typically  Ranging from 104  To  150 both fasting and preprandial.  However  yesterday he skipped his insulin  dose in the evening and this morning's sugar  was 200 . He is observing Ramadan for the ext month, which means he will be fasting from 4:30 am to sundown , and presents today for adjustment of insulin regimen.     Past Medical History  Diagnosis Date  . Hyperlipidemia   . Hypertension   . Diabetes mellitus     No past surgical history on file.   The following portions of the patient's history were reviewed and updated as appropriate: Allergies, current medications, and problem list.    Review of Systems:   12 Pt  review of systems was negative except those addressed in the HPI,     History   Social History  . Marital Status: Married    Spouse Name: N/A    Number of Children: N/A  . Years of Education: N/A   Occupational History  . Not on file.   Social History Main Topics  . Smoking status: Former Smoker    Quit date: 06/25/2001  . Smokeless tobacco: Never Used  . Alcohol Use: No  . Drug Use: No  . Sexually Active: Not on file   Other Topics Concern  . Not on file   Social History Narrative  . No narrative on file    Objective:  BP 120/72  Pulse 100  Temp 98.3 F (36.8 C) (Oral)  Resp 16  Wt 156 lb 8 oz (70.988 kg)  SpO2 97%  General  appearance: alert, cooperative and appears stated age Neck: no adenopathy, no carotid bruit, supple, symmetrical, trachea midline and thyroid not enlarged, symmetric, no tenderness/mass/nodules Back: symmetric, no curvature. ROM normal. No CVA tenderness. Lungs: clear to auscultation bilaterally Heart: regular rate and rhythm, S1, S2 normal, no murmur, click, rub or gallop Abdomen: soft, non-tender; bowel sounds normal; no masses,  no organomegaly Pulses: 2+ and symmetric Skin: Skin color, texture, turgor normal. No rashes or lesions Foot exam:  Nails are well trimmed,  No callouses,  Sensation intact to microfilament   Assessment and Plan:  Diabetes mellitus type 2 with complications, uncontrolled Slight improvement per April hgba1c of 9.3.  We are changing his regimen during Ramadan  to Lantuss 30 units daily and Novolin insulin before his evening meal, using a sliding scale. He understands the risks of hypoglycemic episodes and uncontrolled diabetes complicated by observation of Ramadan which includes avoiding even water until sunset.   Updated Medication List Outpatient Encounter Prescriptions as of 02/10/2012  Medication Sig Dispense Refill  . aspirin 81 MG tablet Take 81 mg by mouth daily.        Marland Kitchen glucose blood test strip Use as instructed  100 each  12  . insulin aspart (NOVOLOG FLEXPEN) 100 UNIT/ML injection Inject before meal based on sliding scale  1 vial  12  . insulin aspart protamine-insulin aspart (NOVOLOG MIX 70/30 FLEXPEN) (70-30) 100 UNIT/ML injection 12units before mornign meal ,  25 before evening meal,  To be adjusted weekly.  10 mL  12  . Insulin Pen Needle 31G X 8 MM MISC Test blood sugars two times daily  100 each  5  . Lancets (FREESTYLE) lancets Test blood sugars two times daily  100 each  5  . lisinopril (PRINIVIL,ZESTRIL) 10 MG tablet Take 1 tablet (10 mg total) by mouth daily.  30 tablet  11  . Multiple Vitamin (MULTIVITAMIN) tablet Take 1 tablet by mouth daily.         Marland Kitchen DISCONTD: insulin aspart (NOVOLOG FLEXPEN) 100 UNIT/ML injection Inject before meal based on sliding scale  1 vial  12  . insulin glargine (LANTUS SOLOSTAR) 100 UNIT/ML injection 30 units daily for use when fasting  5 pen  PRN  . DISCONTD: insulin glargine (LANTUS SOLOSTAR) 100 UNIT/ML injection 30 units daily for use when fasting  5 pen  PRN     No orders of the defined types were placed in this encounter.    Return in about 2 months (around 04/12/2012).

## 2012-02-13 NOTE — Assessment & Plan Note (Addendum)
Slight improvement per April hgba1c of 9.3.  We are changing his regimen during Ramadan  to Lantuss 30 units daily and Novolin insulin before his evening meal, using a sliding scale. He understands the risks of hypoglycemic episodes and uncontrolled diabetes complicated by observation of Ramadan which includes avoiding even water until sunset.

## 2012-02-26 ENCOUNTER — Other Ambulatory Visit: Payer: Self-pay | Admitting: *Deleted

## 2012-02-26 MED ORDER — INSULIN GLARGINE 100 UNIT/ML ~~LOC~~ SOLN: SUBCUTANEOUS | Status: DC

## 2012-02-26 NOTE — Telephone Encounter (Signed)
Pt needs refill of Lantus Solostar sent to [facility] pharmacy. He is out of sample that was given to him. Rx sent, pt informed.

## 2012-03-01 ENCOUNTER — Other Ambulatory Visit: Payer: Self-pay | Admitting: Internal Medicine

## 2012-03-01 MED ORDER — INSULIN GLARGINE 100 UNIT/ML ~~LOC~~ SOLN: SUBCUTANEOUS | Status: DC

## 2012-06-13 ENCOUNTER — Ambulatory Visit (INDEPENDENT_AMBULATORY_CARE_PROVIDER_SITE_OTHER): Payer: PRIVATE HEALTH INSURANCE | Admitting: Internal Medicine

## 2012-06-13 ENCOUNTER — Encounter: Payer: Self-pay | Admitting: Internal Medicine

## 2012-06-13 VITALS — BP 134/93 | HR 86 | Temp 97.5°F | Wt 156.5 lb

## 2012-06-13 DIAGNOSIS — E785 Hyperlipidemia, unspecified: Secondary | ICD-10-CM

## 2012-06-13 DIAGNOSIS — E1165 Type 2 diabetes mellitus with hyperglycemia: Secondary | ICD-10-CM

## 2012-06-13 DIAGNOSIS — E118 Type 2 diabetes mellitus with unspecified complications: Secondary | ICD-10-CM

## 2012-06-13 DIAGNOSIS — IMO0002 Reserved for concepts with insufficient information to code with codable children: Secondary | ICD-10-CM

## 2012-06-13 LAB — LDL CHOLESTEROL, DIRECT: Direct LDL: 218.6 mg/dL

## 2012-06-13 LAB — COMPREHENSIVE METABOLIC PANEL
CO2: 29 mEq/L (ref 19–32)
Calcium: 8.9 mg/dL (ref 8.4–10.5)
Chloride: 99 mEq/L (ref 96–112)
GFR: 125.72 mL/min (ref >60.00)
Glucose, Bld: 191 mg/dL — ABNORMAL HIGH (ref 70–99)
Sodium: 134 mEq/L — ABNORMAL LOW (ref 135–145)
Total Bilirubin: 0.4 mg/dL (ref 0.3–1.2)
Total Protein: 7.4 g/dL (ref 6.0–8.3)

## 2012-06-13 LAB — LIPID PANEL
Cholesterol: 333 mg/dL — ABNORMAL HIGH (ref 0–200)
VLDL: 77.8 mg/dL — ABNORMAL HIGH (ref 0.0–40.0)

## 2012-06-13 LAB — MICROALBUMIN / CREATININE URINE RATIO
Creatinine,U: 165.1 mg/dL
Microalb, Ur: 2.8 mg/dL — ABNORMAL HIGH (ref 0.0–1.9)

## 2012-06-13 MED ORDER — INSULIN ASPART PROT & ASPART (70-30 MIX) 100 UNIT/ML ~~LOC~~ SUSP: SUBCUTANEOUS | Status: DC

## 2012-06-13 MED ORDER — INSULIN GLARGINE 100 UNIT/ML ~~LOC~~ SOLN: SUBCUTANEOUS | Status: DC

## 2012-06-13 NOTE — Patient Instructions (Signed)
Increase the lantus to 35 units  And increase the short acting insulin to 15 units before your two largest meals.

## 2012-06-13 NOTE — Progress Notes (Signed)
Patient ID: Randy Grant, male   DOB: April 01, 1966, 46 y.o.   MRN: [id number]  Patient Active Problem List  Diagnosis  . Diabetes mellitus type 2 with complications, uncontrolled  . DYSLIPIDEMIA  . HYPERTENSION, BENIGN ESSENTIAL  . Right foot pain  . Back sprain/strain, thoracic    Subjective:  CC:   Chief Complaint  Patient presents with  . Follow-up    med refill    HPI:   Randy Grant a 46 y.o. male who presents for follow up on uncontrolled DM.  He was last seen in  July.4 adjustment in insulin regimen since he was entering a Ramadan for the month which would complicate his diabetes regimen due to daily fasting until some down. His last hemoglobin A1c had been improved but still very well elevated at 9.3. We decided that he would use 30 units of Lantus daily along with  sliding scale before any meals.  Currently  he states that his blood sugars have been high for the past couple of weeksbut did not bring his blood sugar log. He states that his postprandial blood sugars have been from 225 to 280 an his  fastings to 190. he still taking the, non-insulin schedule . He is unclear about his current schedule of insulin and has missed several doses. He does seem to take a dose of 70/30 before his largest meal at 7 PM but states he ran out of his insulin 2 weeks ago  .   Past Medical History  Diagnosis Date  . Hyperlipidemia   . Hypertension   . Diabetes mellitus     History reviewed. No pertinent past surgical history.  The following portions of the patient's history were reviewed and updated as appropriate: Allergies, current medications, and problem list.    Review of Systems:   12 Pt  review of systems was negative except those addressed in the HPI,     History   Social History  . Marital Status: Married    Spouse Name: N/A    Number of Children: N/A  . Years of Education: N/A   Occupational History  . Not on file.   Social History Main Topics  .  Smoking status: Former Smoker    Quit date: 06/25/2001  . Smokeless tobacco: Never Used  . Alcohol Use: No  . Drug Use: No  . Sexually Active: Not on file   Other Topics Concern  . Not on file   Social History Narrative  . No narrative on file    Objective:  BP 134/93  Pulse 86  Temp 97.5 F (36.4 C) (Oral)  Wt 156 lb 8 oz (70.988 kg)  SpO2 99%  General appearance: alert, cooperative and appears stated age Ears: normal TM's and external ear canals both ears Throat: lips, mucosa, and tongue normal; teeth and gums normal Neck: no adenopathy, no carotid bruit, supple, symmetrical, trachea midline and thyroid not enlarged, symmetric, no tenderness/mass/nodules Back: symmetric, no curvature. ROM normal. No CVA tenderness. Lungs: clear to auscultation bilaterally Heart: regular rate and rhythm, S1, S2 normal, no murmur, click, rub or gallop Abdomen: soft, non-tender; bowel sounds normal; no masses,  no organomegaly Pulses: 2+ and symmetric Skin: Skin color, texture, turgor normal. No rashes or lesions Lymph nodes: Cervical, supraclavicular, and axillary nodes normal. Foot exam:  Nails are well trimmed,  No callouses,  Sensation intact to microfilament  Assessment and Plan:  DYSLIPIDEMIA We are starting atorvastatin for management of hyperlipidemia. Current LDL is 2/200 and  his triglycerides are 389.  Diabetes mellitus type 2 with complications, uncontrolled He continues to have uncontrolled blood sugars. His hemoglobin A1c is 9.4 today. Control is problematic because he works third shift and his meals are a bit erratic. I have recommended that he increase the Lantus to 35 units and increase the 70/30insulin to 12 units before his morning meal and 25 units before his evening meal .  I'm recommending that he see an endocrinologist for assistance in getting his sugars under control. referral to Dr. Renae Fickle Under way. Continue lisinopril for early microalbuminemia and continue baby  aspirin. Statin added for uncontrolled hyperlipidemia.   Updated Medication List Outpatient Encounter Prescriptions as of 06/13/2012  Medication Sig Dispense Refill  . aspirin 81 MG tablet Take 81 mg by mouth daily.        Marland Kitchen atorvastatin (LIPITOR) 20 MG tablet Take 1 tablet (20 mg total) by mouth daily.  90 tablet  3  . glucose blood test strip Use as instructed  100 each  12  . insulin aspart (NOVOLOG FLEXPEN) 100 UNIT/ML injection Inject before meal based on sliding scale  1 vial  12  . insulin aspart protamine-insulin aspart (NOVOLOG MIX 70/30 FLEXPEN) (70-30) 100 UNIT/ML injection 12  units before morning meal ,  25 before evening meal,  To be adjusted weekly.  10 mL  12  . insulin glargine (LANTUS SOLOSTAR) 100 UNIT/ML injection 35 units daily for use when fasting  15 mL  2  . Insulin Pen Needle 31G X 8 MM MISC Test blood sugars two times daily  100 each  5  . lisinopril (PRINIVIL,ZESTRIL) 10 MG tablet Take 1 tablet (10 mg total) by mouth daily.  30 tablet  11  . Multiple Vitamin (MULTIVITAMIN) tablet Take 1 tablet by mouth daily.        . [DISCONTINUED] insulin aspart protamine-insulin aspart (NOVOLOG MIX 70/30 FLEXPEN) (70-30) 100 UNIT/ML injection 12units before mornign meal ,  25 before evening meal,  To be adjusted weekly.  10 mL  12  . [DISCONTINUED] insulin glargine (LANTUS SOLOSTAR) 100 UNIT/ML injection 30 units daily for use when fasting  15 mL  2     Orders Placed This Encounter  Procedures  . Comprehensive metabolic panel  . Lipid panel  . Hemoglobin A1c  . Microalbumin / creatinine urine ratio  . LDL cholesterol, direct  . Ambulatory referral to Endocrinology    Return in about 3 months (around 09/13/2012).

## 2012-06-15 MED ORDER — ATORVASTATIN CALCIUM 20 MG PO TABS: 20.0000 mg | ORAL_TABLET | Freq: Every day | ORAL | Status: DC

## 2012-06-15 NOTE — Assessment & Plan Note (Signed)
We are starting atorvastatin for management of hyperlipidemia. Current LDL is 2/200 and his triglycerides are 389.

## 2012-06-15 NOTE — Assessment & Plan Note (Addendum)
He continues to have uncontrolled blood sugars. His hemoglobin A1c is 9.4 today. Control is problematic because he works third shift and his meals are a bit erratic. I have recommended that he increase the Lantus to 35 units and increase the 70/30insulin to 12 units before his morning meal and 25 units before his evening meal .  I'm recommending that he see an endocrinologist for assistance in getting his sugars under control. referral to Dr. Renae Fickle Under way. Continue lisinopril for early microalbuminemia and continue baby aspirin. Statin added for uncontrolled hyperlipidemia.

## 2012-07-25 ENCOUNTER — Telehealth: Payer: Self-pay | Admitting: Internal Medicine

## 2012-07-25 DIAGNOSIS — I10 Essential (primary) hypertension: Secondary | ICD-10-CM

## 2012-07-25 MED ORDER — INSULIN PEN NEEDLE 31G X 8 MM MISC: Status: DC

## 2012-07-25 MED ORDER — LISINOPRIL 10 MG PO TABS: 10.0000 mg | ORAL_TABLET | Freq: Every day | ORAL | Status: DC

## 2012-07-25 NOTE — Telephone Encounter (Signed)
Refill - Pharmacy - [facility] Fax #[phone] Drug Name- BD Pen Needle Short 31GX5 Directions- Use as directed  Quantity- 100 DIS

## 2012-07-25 NOTE — Telephone Encounter (Signed)
Lisinopril 10 mg and insulin pen needle sent to [facility]

## 2012-07-25 NOTE — Telephone Encounter (Signed)
Pt came in today stating he needs refill Needles for his pen lotus High bp meds Test strip [facility]  Pt would like you to send refill on any meds that needs refilled.  Offered him appointment 08/11/12

## 2012-08-17 ENCOUNTER — Other Ambulatory Visit: Payer: Self-pay | Admitting: Internal Medicine

## 2012-08-17 NOTE — Telephone Encounter (Signed)
glucose blood test strip ( One Touch Ultra )  100  Use as instructed

## 2012-08-18 MED ORDER — GLUCOSE BLOOD VI STRP: ORAL_STRIP | Status: DC

## 2012-08-18 NOTE — Telephone Encounter (Signed)
Med filled.  

## 2012-11-23 ENCOUNTER — Other Ambulatory Visit: Payer: Self-pay | Admitting: *Deleted

## 2012-11-23 NOTE — Telephone Encounter (Signed)
Patient called office requesting refills on medications. Patient states he has not been able to go see specialist you recommended. See office notes 06/13/12. Is it okay to refill?

## 2012-11-24 MED ORDER — INSULIN GLARGINE 100 UNIT/ML ~~LOC~~ SOLN: SUBCUTANEOUS | Status: DC

## 2012-11-24 MED ORDER — INSULIN PEN NEEDLE 31G X 8 MM MISC: Status: DC

## 2012-11-24 NOTE — Telephone Encounter (Signed)
Ok to refill,  Authorized in [employer].  But patient needs to make a follow up with me ASAP then for diabetes  .  Please get him scheduled.  thanks

## 2012-11-24 NOTE — Telephone Encounter (Signed)
Called into pharmacy as requested 

## 2012-12-08 ENCOUNTER — Encounter: Payer: Self-pay | Admitting: Internal Medicine

## 2012-12-08 ENCOUNTER — Ambulatory Visit (INDEPENDENT_AMBULATORY_CARE_PROVIDER_SITE_OTHER): Payer: 59 | Admitting: Internal Medicine

## 2012-12-08 ENCOUNTER — Telehealth: Payer: Self-pay | Admitting: Internal Medicine

## 2012-12-08 VITALS — BP 112/72 | HR 79 | Temp 97.9°F | Resp 16 | Wt 157.2 lb

## 2012-12-08 DIAGNOSIS — IMO0002 Reserved for concepts with insufficient information to code with codable children: Secondary | ICD-10-CM

## 2012-12-08 DIAGNOSIS — E785 Hyperlipidemia, unspecified: Secondary | ICD-10-CM

## 2012-12-08 DIAGNOSIS — Z125 Encounter for screening for malignant neoplasm of prostate: Secondary | ICD-10-CM

## 2012-12-08 DIAGNOSIS — R350 Frequency of micturition: Secondary | ICD-10-CM

## 2012-12-08 DIAGNOSIS — E1165 Type 2 diabetes mellitus with hyperglycemia: Secondary | ICD-10-CM

## 2012-12-08 DIAGNOSIS — E118 Type 2 diabetes mellitus with unspecified complications: Secondary | ICD-10-CM

## 2012-12-08 LAB — COMPREHENSIVE METABOLIC PANEL
Alkaline Phosphatase: 65 U/L (ref 39–117)
BUN: 12 mg/dL (ref 6–23)
Creatinine, Ser: 0.6 mg/dL (ref 0.4–1.5)
GFR: 141.43 mL/min (ref >60.00)
Glucose, Bld: 247 mg/dL — ABNORMAL HIGH (ref 70–99)
Total Bilirubin: 0.6 mg/dL (ref 0.3–1.2)

## 2012-12-08 LAB — MICROALBUMIN / CREATININE URINE RATIO
Creatinine,U: 127.9 mg/dL
Microalb Creat Ratio: 1.2 mg/g (ref 0.0–30.0)
Microalb, Ur: 1.5 mg/dL (ref 0.0–1.9)

## 2012-12-08 LAB — URINALYSIS
Bilirubin Urine: NEGATIVE
Hgb urine dipstick: NEGATIVE
Ketones, ur: NEGATIVE
Leukocytes, UA: NEGATIVE
Nitrite: NEGATIVE
Specific Gravity, Urine: 1.025
Total Protein, Urine: NEGATIVE
Urine Glucose: NEGATIVE
Urobilinogen, UA: 0.2
pH: 6 (ref 5.0–8.0)

## 2012-12-08 LAB — LDL CHOLESTEROL, DIRECT: Direct LDL: 111.6 mg/dL

## 2012-12-08 LAB — PSA: PSA: 0.56 ng/mL (ref 0.10–4.00)

## 2012-12-08 MED ORDER — INSULIN ASPART PROT & ASPART (70-30 MIX) 100 UNIT/ML ~~LOC~~ SUSP: SUBCUTANEOUS | Status: DC

## 2012-12-08 NOTE — Telephone Encounter (Signed)
Randy Grant ,  Can you add the A1c that I left off of lab order this morning?

## 2012-12-08 NOTE — Telephone Encounter (Signed)
Please ask patient to return at his leisure tomorrow or next week for one more blood test that was left off today. He will not be charged for the additional draw, just the test itself

## 2012-12-08 NOTE — Patient Instructions (Addendum)
We are going to resume your 70/30 insulin to take,  15 units before your 1 PM meal   Continue the Lantus 35 units  Return in 3 months

## 2012-12-08 NOTE — Telephone Encounter (Signed)
A lavender top wasn't draw .Marland Kitchen...sorry

## 2012-12-08 NOTE — Progress Notes (Signed)
Patient ID: Randy Grant, male   DOB: 04-23-1966, 47 y.o.   MRN: [id number]   Patient Active Problem List   Diagnosis Date Noted  . Right foot pain 10/27/2011  . Back sprain/strain, thoracic 10/27/2011  . Diabetes mellitus type 2 with complications, uncontrolled 04/25/2007  . DYSLIPIDEMIA 04/25/2007  . HYPERTENSION, BENIGN ESSENTIAL 04/25/1999    Subjective:  CC:   Chief Complaint  Patient presents with  . Follow-up    HPI:   Randy Grant a 47 y.o. male who presents 4 month follow up on uncontrolled diabetes .  He was referred to [facility] Dr. Renae Fickle but missed both of 2 appts, so she will not see him.  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  Marland Kitchen  His blood sugars have been > 200  mostly.  He did not bring a log of his blood sugars and states that most of his fasting blood sugars have been from 80 to 110.  How often are post prandials above 150? 50% of the time..  Checks 1 to 2 times daily.  Takes insulin at  1 pm, then eats, and checks sugar at 3 pm.  Has only been using lantus 35 units   No lows.,  Does not exercise.  weight is stable.    Past Medical History  Diagnosis Date  . Hyperlipidemia   . Hypertension   . Diabetes mellitus     History reviewed. No pertinent past surgical history.     The following portions of the patient's history were reviewed and updated as appropriate: Allergies, current medications, and problem list.    Review of Systems:  Patient denies headache, fevers, malaise, unintentional weight loss, skin rash, eye pain, sinus congestion and sinus pain, sore throat, dysphagia,  hemoptysis , cough, dyspnea, wheezing, chest pain, palpitations, orthopnea, edema, abdominal pain, nausea, melena, diarrhea, constipation, flank pain, dysuria, hematuria, urinary  Frequency, nocturia, numbness, tingling, seizures,  Focal weakness, Loss of consciousness,   Tremor, insomnia, depression, anxiety, and suicidal ideation.      History   Social History  . Marital Status: Married    Spouse Name: N/A    Number of Children: N/A  . Years of Education: N/A   Occupational History  . Not on file.   Social History Main Topics  . Smoking status: Former Smoker    Quit date: 06/25/2001  . Smokeless tobacco: Never Used  . Alcohol Use: No  . Drug Use: No  . Sexually Active: Not on file   Other Topics Concern  . Not on file   Social History Narrative  . No narrative on file    Objective:  BP 112/72  Pulse 79  Temp(Src) 97.9 F (36.6 C) (Oral)  Resp 16  Wt 157 lb 4 oz (71.328 kg)  BMI 25.39 kg/m2  SpO2 99%  General appearance: alert, cooperative and appears stated age Ears: normal TM's and external ear canals both ears Throat: lips, mucosa, and tongue normal; teeth and gums normal Neck: no adenopathy, no carotid bruit, supple, symmetrical, trachea midline and thyroid not enlarged, symmetric, no tenderness/mass/nodules Back: symmetric, no curvature. ROM normal. No CVA tenderness. Lungs: clear to auscultation bilaterally Heart: regular rate and rhythm, S1, S2 normal, no murmur, click, rub or gallop Abdomen: soft, non-tender; bowel sounds normal; no masses,  no organomegaly Pulses: 2+ and symmetric Skin: Skin color, texture, turgor normal. No  rashes or lesions Lymph nodes: Cervical, supraclavicular, and axillary nodes normal.  Assessment and Plan: Diabetes mellitus type 2 with complications, uncontrolled A1c in November was 9.3  On Lantus alone.  Difficult to control due to patient's work schedule , unusual eating habtis, frequent fasts due to religious practices,  And lack of hard data due to patient never bringing his log.  Will continue lantus at 35 units dailoy and  Resume use  70/30 Novolog prior to his 1 pm meal, his largest meal.  Starting with 15 units..  Encouraged to use Mychart to submit blood sugars between visits. Reminder  for annual diabetic eye exam given..  Foot exam done last visit. Meds reviewed and he is on a baby aspirin daily., a statin and an ACE inhibitor.  Urine was negative  for protein.    DYSLIPIDEMIA He is not fasting today so only an LDL was done,  With improvement from 220 to 129 with atorvastatin 20 mg.  Will increase to 40 mg next refill.   Screening for prostate cancer PSA was ordered as this has not been done  Before and was normal at 0.56   Updated Medication List Outpatient Encounter Prescriptions as of 12/08/2012  Medication Sig Dispense Refill  . aspirin 81 MG tablet Take 81 mg by mouth daily.        Marland Kitchen atorvastatin (LIPITOR) 40 MG tablet Take 1 tablet (40 mg total) by mouth daily.  90 tablet  3  . glucose blood (ONE TOUCH ULTRA TEST) test strip Use as instructed  100 each  12  . glucose blood test strip 1 each by Other route as needed. To test glucose levels.      . insulin glargine (LANTUS SOLOSTAR) 100 UNIT/ML injection 35 units daily for use when fasting  15 mL  11  . Insulin Pen Needle 31G X 8 MM MISC Test blood sugars two times daily  100 each  11  . lisinopril (PRINIVIL,ZESTRIL) 10 MG tablet Take 1 tablet (10 mg total) by mouth daily.  30 tablet  11  . Multiple Vitamin (MULTIVITAMIN) tablet Take 1 tablet by mouth daily.        . [DISCONTINUED] atorvastatin (LIPITOR) 20 MG tablet Take 1 tablet (20 mg total) by mouth daily.  90 tablet  3  . insulin aspart (NOVOLOG FLEXPEN) 100 UNIT/ML injection Inject before meal based on sliding scale  1 vial  12  . insulin aspart protamine- aspart (NOVOLOG 70/30) (70-30) 100 UNIT/ML injection 15 units daily before your meal  10 mL  12  . [DISCONTINUED] insulin aspart protamine-insulin aspart (NOVOLOG MIX 70/30 FLEXPEN) (70-30) 100 UNIT/ML injection 12  units before morning meal ,  25 before evening meal,  To be adjusted weekly.  10 mL  12   No facility-administered encounter medications on file as of 12/08/2012.

## 2012-12-09 NOTE — Telephone Encounter (Signed)
Spoke to pt, has an appointment for the labs Monday 05.05.2015 at 10:30 to get his A1c done

## 2012-12-10 ENCOUNTER — Encounter: Payer: Self-pay | Admitting: Internal Medicine

## 2012-12-10 DIAGNOSIS — Z125 Encounter for screening for malignant neoplasm of prostate: Secondary | ICD-10-CM | POA: Insufficient documentation

## 2012-12-10 MED ORDER — ATORVASTATIN CALCIUM 40 MG PO TABS: 40.0000 mg | ORAL_TABLET | Freq: Every day | ORAL | Status: DC

## 2012-12-10 NOTE — Assessment & Plan Note (Addendum)
A1c in November was 9.3  On Lantus alone.  Difficult to control due to patient's work schedule , unusual eating habtis, frequent fasts due to religious practices,  And lack of hard data due to patient never bringing his log.  Will continue lantus at 35 units dailoy and  Resume use  70/30 Novolog prior to his 1 pm meal, his largest meal.  Starting with 15 units..  Encouraged to use Mychart to submit blood sugars between visits. Reminder for annual diabetic eye exam given..  Foot exam done last visit. Meds reviewed and he is on a baby aspirin daily., a statin and an ACE inhibitor.  Urine was negative  for protein.

## 2012-12-10 NOTE — Assessment & Plan Note (Addendum)
PSA was ordered as this has not been done  Before and was normal at 0.56

## 2012-12-10 NOTE — Assessment & Plan Note (Signed)
He is not fasting today so only an LDL was done,  With improvement from 220 to 129 with atorvastatin 20 mg.  Will increase to 40 mg next refill.

## 2012-12-12 ENCOUNTER — Other Ambulatory Visit (INDEPENDENT_AMBULATORY_CARE_PROVIDER_SITE_OTHER): Payer: 59

## 2012-12-12 DIAGNOSIS — E118 Type 2 diabetes mellitus with unspecified complications: Secondary | ICD-10-CM

## 2012-12-12 DIAGNOSIS — IMO0002 Reserved for concepts with insufficient information to code with codable children: Secondary | ICD-10-CM

## 2012-12-12 DIAGNOSIS — E1165 Type 2 diabetes mellitus with hyperglycemia: Secondary | ICD-10-CM

## 2012-12-12 LAB — HEMOGLOBIN A1C: Hgb A1c MFr Bld: 10.7 % — ABNORMAL HIGH (ref 4.6–6.5)

## 2012-12-13 NOTE — Progress Notes (Signed)
Detailed message left with wife.

## 2012-12-13 NOTE — Addendum Note (Signed)
Addended by: Sherlene Shams on: 12/13/2012 12:47 PM   Modules accepted: Orders

## 2012-12-14 ENCOUNTER — Telehealth: Payer: Self-pay | Admitting: Internal Medicine

## 2012-12-14 NOTE — Telephone Encounter (Signed)
Patient left voice mail asking you to return his call.  He didn't say what it was in reference to.

## 2012-12-14 NOTE — Telephone Encounter (Signed)
Left message on voicemail for patient to call office. 

## 2012-12-15 NOTE — Telephone Encounter (Signed)
Left detailed message with patients wife per DPR as to patient having endocrinology appointment.

## 2012-12-21 ENCOUNTER — Telehealth: Payer: Self-pay | Admitting: Emergency Medicine

## 2012-12-21 NOTE — Telephone Encounter (Signed)
Pt returned call [id number].  Does think that he will be able to keep the 01/18/13 11am appt with Dr. Elvera Lennox.    Best number to call patient if needed, [phone].

## 2013-01-03 ENCOUNTER — Telehealth: Payer: Self-pay | Admitting: Internal Medicine

## 2013-01-03 MED ORDER — INSULIN ASPART 100 UNIT/ML ~~LOC~~ SOLN: SUBCUTANEOUS | Status: DC

## 2013-01-03 NOTE — Telephone Encounter (Signed)
Pt came in today he stated he needs refills on novolog spart. Pt stated he did go by the drug store  They don't have rx [facility]  Pt is complete out of his med.  Pt did not have any for today

## 2013-01-03 NOTE — Telephone Encounter (Signed)
Script sent & patient notified .  

## 2013-01-18 ENCOUNTER — Encounter: Payer: Self-pay | Admitting: Internal Medicine

## 2013-01-18 ENCOUNTER — Ambulatory Visit (INDEPENDENT_AMBULATORY_CARE_PROVIDER_SITE_OTHER): Payer: 59 | Admitting: Internal Medicine

## 2013-01-18 VITALS — BP 124/78 | HR 102 | Temp 97.9°F | Resp 12 | Ht 67.5 in | Wt 158.0 lb

## 2013-01-18 DIAGNOSIS — E1165 Type 2 diabetes mellitus with hyperglycemia: Secondary | ICD-10-CM

## 2013-01-18 DIAGNOSIS — IMO0002 Reserved for concepts with insufficient information to code with codable children: Secondary | ICD-10-CM

## 2013-01-18 MED ORDER — INSULIN GLARGINE 100 UNIT/ML ~~LOC~~ SOLN: SUBCUTANEOUS | Status: DC

## 2013-01-18 MED ORDER — INSULIN ASPART 100 UNIT/ML ~~LOC~~ SOLN: SUBCUTANEOUS | Status: DC

## 2013-01-18 MED ORDER — METFORMIN HCL ER 500 MG PO TB24: ORAL_TABLET | ORAL | Status: DC

## 2013-01-18 NOTE — Progress Notes (Signed)
Patient ID: Randy Grant, male   DOB: [birth year], 47 y.o.   MRN: [id number]  HPI: Randy Grant is a 47 y.o.-year-old male, referred by his PCP, Dr. Darrick Huntsman, for management of DM2, insulin-dependent, uncontrolled, without complications.  Patient has been diagnosed with diabetes in 1995; he started insulin 2 years ago. Last hemoglobin A1c was: Lab Results  Component Value Date   HGBA1C 10.7* 12/12/2012  Previously 9.4%.  Pt is on a regimen of: - Lantus 35 units at 3 am - misses one day a week - Novolog 70/30 15 units at 1 pm (wakes up for lunch) - misses one day a week Was on Novolog before but 70/30 sent to his pharmacy afterwards. Was on Metformin but made his stomach upset.  Pt checks his sugars 1x a day and they are: - am, at the end of his day: 83-120, 2-3 h after eating; if eats sweats it can go to 170 Has lows approx 1 a week. Lowest sugar was 55 in last 2 mo - before switching to 70/30, now less severe 70-80s; he has hypoglycemia awareness in the 50s. Highest sugar was 315 once a week, usually when not taking the meds.    Pt's meals are: - 12 am: chicken nuggets, chicken tenders, grilled chicken + 2 pieces of wheat bread, salad, apple - 3 am: Lantus - 1 pm: home cooked: fish/lamb + rice + veggies - 6-7 pm: snack; PB + crackers and coffee  He works 4 pm-4 am 5x days a week. He will start Ramadan fasting x 30 days starting June 29th. During this time, his main meals would be at 8:30 PM and probably around 3:30 AM, he mentions.  Pt does not have chronic kidney disease, last BUN/creatinine was:  Lab Results  Component Value Date   BUN 12 12/08/2012   CREATININE 0.6 12/08/2012   Last set of lipids: Lab Results  Component Value Date   CHOL 333* 06/13/2012   HDL 37.30* 06/13/2012   LDLDIRECT 111.6 12/08/2012   TRIG 389.0* 06/13/2012   CHOLHDL 9 06/13/2012   Pt's last eye exam was in 9 ago. Has DR in [facility] >> had Laser tx at last visit. Denies numbness and tingling in his  legs.  I reviewed his chart and he also has a history of HL and HTN.  Pt has FH of DM in mother, sisters, brothers.  ROS: Constitutional: no weight gain/loss, no fatigue, no subjective hyperthermia/hypothermia Eyes: no blurry vision, no xerophthalmia ENT: no sore throat, no nodules palpated in throat, no dysphagia/odynophagia, no hoarseness Cardiovascular: no CP/SOB/palpitations/leg swelling Respiratory: no cough/SOB Gastrointestinal: no N/V/D/C Musculoskeletal: no muscle/joint aches Skin: no rashes Neurological: no tremors/numbness/tingling/dizziness Psychiatric: no depression/anxiety  Past Medical History  Diagnosis Date  . Hyperlipidemia   . Hypertension   . Diabetes mellitus    History reviewed. No pertinent past surgical history.  History   Social History  . Marital Status: Married    Spouse Name: N/A    Number of Children: N/A  . Years of Education: N/A   Occupational History  . Not on file.   Social History Main Topics  . Smoking status: Former Smoker    Quit date: 06/25/2001  . Smokeless tobacco: Never Used  . Alcohol Use: No  . Drug Use: No   Current Outpatient Prescriptions on File Prior to Visit  Medication Sig Dispense Refill  . aspirin 81 MG tablet Take 81 mg by mouth daily.        Marland Kitchen  atorvastatin (LIPITOR) 40 MG tablet Take 1 tablet (40 mg total) by mouth daily.  90 tablet  3  . glucose blood (ONE TOUCH ULTRA TEST) test strip Use as instructed  100 each  12  . glucose blood test strip 1 each by Other route as needed. To test glucose levels.      . Insulin Pen Needle 31G X 8 MM MISC Test blood sugars two times daily  100 each  11  . lisinopril (PRINIVIL,ZESTRIL) 10 MG tablet Take 1 tablet (10 mg total) by mouth daily.  30 tablet  11  . Multiple Vitamin (MULTIVITAMIN) tablet Take 1 tablet by mouth daily.        Also on insulin regimen as mentioned in HPI.  No Known Allergies  Family History  Problem Relation Age of Onset  . Diabetes Mother   .  Diabetes Father    PE: BP 124/78  Pulse 102  Temp(Src) 97.9 F (36.6 C) (Oral)  Resp 12  Ht 5' 7.5" (1.715 m)  Wt 158 lb (71.668 kg)  BMI 24.37 kg/m2  SpO2 97% Wt Readings from Last 3 Encounters:  01/18/13 158 lb (71.668 kg)  12/08/12 157 lb 4 oz (71.328 kg)  06/13/12 156 lb 8 oz (70.988 kg)   Constitutional: normal weight, in NAD Eyes: PERRLA, EOMI, no exophthalmos ENT: moist mucous membranes, no thyromegaly, no cervical lymphadenopathy Cardiovascular: RRR, No MRG Respiratory: CTA B Gastrointestinal: abdomen soft, NT, ND, BS+ Musculoskeletal: no deformities, strength intact in all 4 Skin: moist, warm, no rashes Neurological: no tremor with outstretched hands, DTR normal in all 4  ASSESSMENT: 1. DM2, insulin-dependent, uncontrolled, without complications  PLAN:  1. Patient with recent worsening of his diabetes control, with an unusual meal pattern and work schedule, also with short periods of noncompliance due to either running out of medicines or forgetting about insulin. - We discussed about the fact that he will need to start checking his sugars at least twice a day and he absolutely needs to bring his sugars with him, otherwise, it is almost impossible to adjust his insulin regimen. He agrees to do that. - He is a little confused about his insulin regimen, being on both Lantus and 70/30 NovoLog insulin but mentioned that he might miss only one dose of either in a week. He takes his 70/30 insulin before his lunch (1 PM meal). - After a long discussion, in which I tried to understand his meal and insulin injection schedule, I recommended that we do the following:  Please decrease the Lantus to 25 units. Stop the Novolog 70/30 insulin. Restart Novolog at 10 units with your 2 main meals. Start Metformin XR 500 mg with your 1 pm meal x 5 days, and, if you can tolerate this well, add another 500 mg tablet with your 12 am meal. Please call me or send me a message through MyChart  on 06/26 or 06/27 to tell me about your sugars. For Ramadan, stay on the Lantus, but drop to 20 units, and also stay on Metformin. Please call if sugars persistently >200 or <80. - For Ramadan, there is not a large change in his diet, he just moves his main meals at different times - please see history of present illness; therefore, I don't think we need to change his regimen drastically at that time, but I would make this decision based on his sugars right before starting the Ramadan - He does not have good hypoglycemia awareness, and he does have low blood  sugars, and I advised him that our first concern is to avoid the lows. He might have some high sugars after the regimen change above, in which case we would slowly increase the doses of his insulin. - given sugar log and advised how to fill it and to bring it at next appt -at least twice a day, before main meals  - given foot care handout and explained the principles - given instructions for hypoglycemia management "15-15 rule" - I will see him back in a month with his sugar log

## 2013-01-18 NOTE — Patient Instructions (Addendum)
Please return in 1 month with your sugar log.  Please decrease the Lantus to 25 units. Stop the Novolog 70/30 insulin. Restart Novolog at 10 units with your 2 main meals. Start Metformin XR 500 mg with your 1 pm meal x 5 days, and, if you can tolerate this well, add another 500 mg tablet with your 12 am meal. Please call me or send me a message through MyChart on 06/26 or 06/27 to tell me about your sugars.  For Ramadan, stay on the Lantus , but drop to 20 units, and also stay on Metformin. Please call with sugars persistently >200 or <80.  PATIENT INSTRUCTIONS FOR TYPE 2 DIABETES:  **Please join MyChart!** - see attached instructions about how to join   DIET AND EXERCISE Diet and exercise is an important part of diabetic treatment.  We recommended aerobic exercise in the form of brisk walking (working between 40-60% of maximal aerobic capacity, similar to brisk walking) for 150 minutes per week (such as 30 minutes five days per week) along with 3 times per week performing 'resistance' training (using various gauge rubber tubes with handles) 5-10 exercises involving the major muscle groups (upper body, lower body and core) performing 10-15 repetitions (or near fatigue) each exercise. Start at half the above goal but build slowly to reach the above goals. If limited by weight, joint pain, or disability, we recommend daily walking in a swimming pool with water up to waist to reduce pressure from joints while allow for adequate exercise.    BLOOD GLUCOSES Monitoring your blood glucoses is important for continued management of your diabetes. Please check your blood glucoses 2-4 times a day: fasting, before meals and at bedtime (you can rotate these measurements - e.g. one day check before the 3 meals, the next day check before 2 of the meals and before bedtime, etc.   HYPOGLYCEMIA (low blood sugar) Hypoglycemia is usually a reaction to not eating, exercising, or taking too much insulin/ other  diabetes drugs.  Symptoms include tremors, sweating, hunger, confusion, headache, etc. Treat IMMEDIATELY with 15 grams of Carbs:   4 glucose tablets    cup regular juice/soda   2 tablespoons raisins   4 teaspoons sugar   1 tablespoon honey Recheck blood glucose in 15 mins and repeat above if still symptomatic/blood glucose <100. Please contact our office at [phone] if you have questions about how to next handle your insulin.  RECOMMENDATIONS TO REDUCE YOUR RISK OF DIABETIC COMPLICATIONS: * Take your prescribed MEDICATION(S). * Follow a DIABETIC diet: Complex carbs, fiber rich foods, heart healthy fish twice weekly, (monounsaturated and polyunsaturated) fats * AVOID saturated/trans fats, high fat foods, >2,300 mg salt per day. * EXERCISE at least 5 times a week for 30 minutes or preferably daily.  * DO NOT SMOKE OR DRINK more than 1 drink a day. * Check your FEET every day. Do not wear tightfitting shoes. Contact us if you develop an ulcer * See your EYE doctor once a year or more if needed * Get a FLU shot once a year * Get a PNEUMONIA vaccine once before and once after age 56 years  GOALS:  * Your Hemoglobin A1c of <7%  * Your Systolic BP should be 140 or lower  * Your Diastolic BP should be 80 or lower  * Your HDL (Good Cholesterol) should be 40 or higher  * Your LDL (Bad Cholesterol) should be 100 or lower  * Your Triglycerides should be 150 or lower  *  Your Urine microalbumin (kidney function) should be <30 * Your Body Mass Index should be 25 or lower   We will be glad to help you achieve these goals. Our telephone number is: [phone].

## 2013-02-23 ENCOUNTER — Telehealth: Payer: Self-pay | Admitting: Internal Medicine

## 2013-02-23 DIAGNOSIS — IMO0002 Reserved for concepts with insufficient information to code with codable children: Secondary | ICD-10-CM

## 2013-02-23 DIAGNOSIS — E1165 Type 2 diabetes mellitus with hyperglycemia: Secondary | ICD-10-CM

## 2013-02-23 MED ORDER — INSULIN GLARGINE 100 UNIT/ML ~~LOC~~ SOLN: SUBCUTANEOUS | Status: DC

## 2013-02-23 NOTE — Telephone Encounter (Signed)
I received a fax from [facility], RN, CDE, at [facility], about patient's blood sugars - he is now going through Ramadan, and can only eat when sun is down. - 4 pm, fasting: 104-227 - 8:30 pm, before eating: 123-242, but mostly on the lower side - 4 am and, after eating (~1h): [phone] He is on a regimen of Lantus 35 units at 4 AM, NovoLog 10 units with his 8:30 pm meal and with his 4 am meal.  Based on the above sugars, I advised him to increase his Lantus to 30 units for now. Advised him to send me the sugars again in 2 weeks.

## 2013-03-27 LAB — HEMOGLOBIN A1C: Hgb A1c MFr Bld: 9.4 % — AB (ref 4.0–6.0)

## 2013-03-30 ENCOUNTER — Telehealth: Payer: Self-pay | Admitting: Internal Medicine

## 2013-03-30 NOTE — Telephone Encounter (Signed)
Recent a1c reported as 9.4 at [facility].  No other labs .  I referred him to Endocrinology last November and he is now seeing Dr Lafe Garin. Will forward to her

## 2013-04-03 ENCOUNTER — Encounter: Payer: Self-pay | Admitting: Internal Medicine

## 2013-04-19 LAB — HM DIABETES EYE EXAM

## 2013-05-29 ENCOUNTER — Telehealth: Payer: Self-pay | Admitting: *Deleted

## 2013-05-29 NOTE — Telephone Encounter (Signed)
Refill Request   Freestyle Lancets  #100  Test Blood sugars two times daily

## 2013-07-22 LAB — HEMOGLOBIN A1C: Hgb A1c MFr Bld: 10.1 % — AB (ref 4.0–6.0)

## 2013-08-07 ENCOUNTER — Ambulatory Visit (INDEPENDENT_AMBULATORY_CARE_PROVIDER_SITE_OTHER): Payer: 59 | Admitting: Internal Medicine

## 2013-08-07 ENCOUNTER — Encounter: Payer: Self-pay | Admitting: Internal Medicine

## 2013-08-07 VITALS — BP 118/74 | HR 108 | Temp 98.9°F | Wt 157.5 lb

## 2013-08-07 DIAGNOSIS — J209 Acute bronchitis, unspecified: Secondary | ICD-10-CM

## 2013-08-07 MED ORDER — HYDROCODONE-HOMATROPINE 5-1.5 MG/5ML PO SYRP: 5.0000 mL | ORAL_SOLUTION | Freq: Three times a day (TID) | ORAL | Status: DC | PRN

## 2013-08-07 MED ORDER — AZITHROMYCIN 250 MG PO TABS: ORAL_TABLET | ORAL | Status: DC

## 2013-08-07 NOTE — Progress Notes (Signed)
Pre-visit discussion using our clinic review tool. No additional management support is needed unless otherwise documented below in the visit note.  

## 2013-08-07 NOTE — Progress Notes (Signed)
HPI  Pt presents to the clinic today with c/o cold symptoms x 3 days. The worst part is the sore throat and dry cough. He does not produce any sputum. He denies fever. He has tried Robitussin, Mucinex, cough drops and nothing seems to help. The cough is worse at night. He has not had much sleep in 3 nights. He does not have a history of allergies or asthma. He does have sick contacts.  Review of Systems      Past Medical History  Diagnosis Date  . Hyperlipidemia   . Hypertension   . Diabetes mellitus     Family History  Problem Relation Age of Onset  . Diabetes Mother   . Diabetes Father     History   Social History  . Marital Status: Married    Spouse Name: N/A    Number of Children: N/A  . Years of Education: N/A   Occupational History  . Not on file.   Social History Main Topics  . Smoking status: Former Smoker    Quit date: 06/25/2001  . Smokeless tobacco: Never Used  . Alcohol Use: No  . Drug Use: No  . Sexual Activity: Not on file   Other Topics Concern  . Not on file   Social History Narrative  . No narrative on file    No Known Allergies   Constitutional: Positive headache, fatigue and fever. Denies abrupt weight changes.  HEENT:  Positive sore throat. Denies eye redness, eye pain, pressure behind the eyes, facial pain, nasal congestion, ear pain, ringing in the ears, wax buildup, runny nose or bloody nose. Respiratory: Positive cough. Denies difficulty breathing or shortness of breath.  Cardiovascular: Denies chest pain, chest tightness, palpitations or swelling in the hands or feet.   No other specific complaints in a complete review of systems (except as listed in HPI above).  Objective:   BP 118/74  Pulse 108  Temp(Src) 98.9 F (37.2 C) (Oral)  Wt 157 lb 8 oz (71.442 kg)  SpO2 99% Wt Readings from Last 3 Encounters:  08/07/13 157 lb 8 oz (71.442 kg)  01/18/13 158 lb (71.668 kg)  12/08/12 157 lb 4 oz (71.328 kg)     General: Appears  his stated age, well developed, well nourished in NAD. HEENT: Head: normal shape and size; Eyes: sclera white, no icterus, conjunctiva pink, PERRLA and EOMs intact; Ears: Tm's gray and intact, normal light reflex; Nose: mucosa pink and moist, septum midline; Throat/Mouth: + PND. Teeth present, mucosa erythematous and moist, no exudate noted, no lesions or ulcerations noted.  Neck: Mild cervical lymphadenopathy. Neck supple, trachea midline. No massses, lumps or thyromegaly present.  Cardiovascular: Normal rate and rhythm. S1,S2 noted.  No murmur, rubs or gallops noted. No JVD or BLE edema. No carotid bruits noted. Pulmonary/Chest: Normal effort and scattered rhonchi throughout. No respiratory distress. No wheezes, rales or noted.      Assessment & Plan:   Acute Bronchitis:  Get some rest and drink plenty of water Do salt water gargles for the sore throat eRx for Azithromax x 5 days eRx for Hycodan cough syrup  RTC as needed or if symptoms persist.

## 2013-08-07 NOTE — Patient Instructions (Signed)
Acute Bronchitis Bronchitis is inflammation of the airways that extend from the windpipe into the lungs (bronchi). The inflammation often causes mucus to develop. This leads to a cough, which is the most common symptom of bronchitis.  In acute bronchitis, the condition usually develops suddenly and goes away over time, usually in a couple weeks. Smoking, allergies, and asthma can make bronchitis worse. Repeated episodes of bronchitis may cause further lung problems.  CAUSES Acute bronchitis is most often caused by the same virus that causes a cold. The virus can spread from person to person (contagious).  SIGNS AND SYMPTOMS   Cough.   Fever.   Coughing up mucus.   Body aches.   Chest congestion.   Chills.   Shortness of breath.   Sore throat.  DIAGNOSIS  Acute bronchitis is usually diagnosed through a physical exam. Tests, such as chest X-rays, are sometimes done to rule out other conditions.  TREATMENT  Acute bronchitis usually goes away in a couple weeks. Often times, no medical treatment is necessary. Medicines are sometimes given for relief of fever or cough. Antibiotics are usually not needed but may be prescribed in certain situations. In some cases, an inhaler may be recommended to help reduce shortness of breath and control the cough. A cool mist vaporizer may also be used to help thin bronchial secretions and make it easier to clear the chest.  HOME CARE INSTRUCTIONS  Get plenty of rest.   Drink enough fluids to keep your urine clear or pale yellow (unless you have a medical condition that requires fluid restriction). Increasing fluids may help thin your secretions and will prevent dehydration.   Only take over-the-counter or prescription medicines as directed by your health care provider.   Avoid smoking and secondhand smoke. Exposure to cigarette smoke or irritating chemicals will make bronchitis worse. If you are a smoker, consider using nicotine gum or skin  patches to help control withdrawal symptoms. Quitting smoking will help your lungs heal faster.   Reduce the chances of another bout of acute bronchitis by washing your hands frequently, avoiding people with cold symptoms, and trying not to touch your hands to your mouth, nose, or eyes.   Follow up with your health care provider as directed.  SEEK MEDICAL CARE IF: Your symptoms do not improve after 1 week of treatment.  SEEK IMMEDIATE MEDICAL CARE IF:  You develop an increased fever or chills.   You have chest pain.   You have severe shortness of breath.  You have bloody sputum.   You develop dehydration.  You develop fainting.  You develop repeated vomiting.  You develop a severe headache. MAKE SURE YOU:   Understand these instructions.  Will watch your condition.  Will get help right away if you are not doing well or get worse. Document Released: 09/03/2004 Document Revised: 03/29/2013 Document Reviewed: 01/17/2013 [employer] Patient Information 2014 [employer].  

## 2013-08-09 ENCOUNTER — Telehealth: Payer: Self-pay | Admitting: *Deleted

## 2013-08-09 NOTE — Telephone Encounter (Signed)
We received a copy of labs performed at [facility]. Pt's HgbA1c was 10.1, up from 9.4. Dr Elvera Lennox noted that pt needs to schedule an office visit with her. Pt has not been seen since 01/2013. Please call and schedule an appt with Dr Elvera Lennox. Thank you!

## 2013-08-11 ENCOUNTER — Other Ambulatory Visit: Payer: Self-pay | Admitting: Internal Medicine

## 2013-08-17 ENCOUNTER — Other Ambulatory Visit: Payer: Self-pay | Admitting: Internal Medicine

## 2013-08-21 ENCOUNTER — Ambulatory Visit (INDEPENDENT_AMBULATORY_CARE_PROVIDER_SITE_OTHER): Payer: 59 | Admitting: Internal Medicine

## 2013-08-21 ENCOUNTER — Encounter: Payer: Self-pay | Admitting: Internal Medicine

## 2013-08-21 VITALS — BP 134/76 | HR 93 | Temp 97.7°F | Resp 12 | Wt 156.8 lb

## 2013-08-21 DIAGNOSIS — E1165 Type 2 diabetes mellitus with hyperglycemia: Secondary | ICD-10-CM

## 2013-08-21 DIAGNOSIS — IMO0002 Reserved for concepts with insufficient information to code with codable children: Secondary | ICD-10-CM

## 2013-08-21 DIAGNOSIS — Z0289 Encounter for other administrative examinations: Secondary | ICD-10-CM

## 2013-08-21 DIAGNOSIS — E118 Type 2 diabetes mellitus with unspecified complications: Secondary | ICD-10-CM

## 2013-08-21 MED ORDER — METFORMIN HCL ER 500 MG PO TB24: 1000.0000 mg | ORAL_TABLET | Freq: Two times a day (BID) | ORAL | Status: DC

## 2013-08-21 NOTE — Progress Notes (Signed)
Patient ID: Randy Grant, male   DOB: 10/16/1965, 48 y.o.   MRN: [id number]  HPI: Randy Grant is a 48 y.o.-year-old male, returning for f/u for DM2, dx 1995, insulin-dependent since 2012, uncontrolled, without complications.  Last hemoglobin A1c was: 07/2013: 10.1% Lab Results  Component Value Date   HGBA1C 9.4* 03/27/2013   HGBA1C 10.7* 12/12/2012   HGBA1C 9.4* 06/13/2012   Pt is on a regimen of: - he was on Metformin XR >> no refills >> ran out of it 1 mo ago, but did tolerate it well, he mentions - Lantus 30 units at 3 am - not missing doses anymore - Novolog 10 unit bid: 2:30 pm (lunchtime) and 12 am (dinnertime). He works 4 pm-4 am 5x days a week, but will work 4 pm -12 am starting next week Was on Novolog before but 70/30. Was on Metformin but made his stomach upset.  Pt checks his sugars 1-2x a day and they are (per download of meter): - am, at the end of his day: 83-120 >> 80s in last 2 days, prior to this (?if 2/2 URI): 158-317 - ~ 2 pm: 160-232   Lowest sugar was 54 in last 6 mo; he has hypoglycemia awareness in the 50s. Highest sugar was 315.  Pt does not have chronic kidney disease, last BUN/creatinine was:  Lab Results  Component Value Date   BUN 12 12/08/2012   CREATININE 0.6 12/08/2012  He is on Lisinopril. Last set of lipids: Lab Results  Component Value Date   CHOL 333* 06/13/2012   HDL 37.30* 06/13/2012   LDLDIRECT 111.6 12/08/2012   TRIG 389.0* 06/13/2012   CHOLHDL 9 06/13/2012  He is on Atorvastatin. Pt's last eye exam was 1 yr ago. Has DR in [facility] >> had Laser tx at last visit.  Denies numbness and tingling in his feet.  He also has a history of HL and HTN.  I reviewed pt's medications, allergies, PMH, social hx, family hx and no changes required, except as mentioned above.  ROS: Constitutional: no weight gain/loss, no fatigue, no subjective hyperthermia/hypothermia Eyes: no blurry vision, no xerophthalmia ENT: no sore throat, no nodules palpated in  throat, no dysphagia/odynophagia, no hoarseness Cardiovascular: no CP/SOB/palpitations/leg swelling Respiratory: no cough/SOB Gastrointestinal: no N/V/D/C Musculoskeletal: no muscle/joint aches Skin: no rashes Neurological: no tremors/numbness/tingling/dizziness  PE: BP 134/76  Pulse 93  Temp(Src) 97.7 F (36.5 C) (Oral)  Resp 12  Wt 156 lb 12.8 oz (71.124 kg)  SpO2 96% Wt Readings from Last 3 Encounters:  08/21/13 156 lb 12.8 oz (71.124 kg)  08/07/13 157 lb 8 oz (71.442 kg)  01/18/13 158 lb (71.668 kg)   Constitutional: normal weight, in NAD Eyes: PERRLA, EOMI, no exophthalmos ENT: moist mucous membranes, no thyromegaly, no cervical lymphadenopathy Cardiovascular: RRR, No MRG Respiratory: CTA B Gastrointestinal: abdomen soft, NT, ND, BS+ Musculoskeletal: no deformities, strength intact in all 4 Skin: moist, warm, no rashes Neurological: no tremor with outstretched hands, DTR normal in all 4  ASSESSMENT: 1. DM2, insulin-dependent, uncontrolled, without complications  PLAN:  1. Patient with recent worsening of his diabetes control, with an unusual meal pattern and work schedule, returning after an absence of 6 mo. Fortunately, after next week, his work schedule will change to a more physiologic one: from 4 pm - 4 am to 4 pm - 12 am. I therefore advised him to increase the nr of NovoLog boluses to 3 to cover all his 3 main meals. The Lantus we kept the  same but moved it at night, after work. I advised him not to run out of Metformin anymore... -  I recommended that we do the following:  Patient Instructions  Continue Lantus 30 units at night. Continue NovoLog 10 units 3x a day before main meals. Restart Metformin XR 500 mg with dinner x 3 days. If you tolerate this well, add another Metformin tablet (500 mg) with breakfast x 3 days. If you tolerate this well, add another metformin tablet with dinner (total 1000 mg) x 3 days. If you tolerate this well, add another metformin  tablets with breakfast (total 1000 mg). Continue with 1000 mg of metformin twice a day with breakfast and dinner. - given new sugar log and advised how to fill it and to bring it at next appt -at least twice a day, before main meals  - advised to schedule a new eye appt - I will see him back in 3 weeks - a month with his sugar log

## 2013-08-21 NOTE — Patient Instructions (Signed)
Continue Lantus 30 units at night. Continue NovoLog 10 units 3x a day before main meals. Restart Metformin 500 mg with dinner x 3 days. If you tolerate this well, add another Metformin tablet (500 mg) with breakfast x 3 days. If you tolerate this well, add another metformin tablet with dinner (total 1000 mg) x 3 days. If you tolerate this well, add another metformin tablets with breakfast (total 1000 mg). Continue with 1000 mg of metformin twice a day with breakfast and dinner. Please return in 1 month with your sugar log.

## 2013-09-01 ENCOUNTER — Ambulatory Visit (INDEPENDENT_AMBULATORY_CARE_PROVIDER_SITE_OTHER): Payer: 59 | Admitting: Adult Health

## 2013-09-01 ENCOUNTER — Encounter: Payer: Self-pay | Admitting: Adult Health

## 2013-09-01 ENCOUNTER — Other Ambulatory Visit: Payer: Self-pay | Admitting: Internal Medicine

## 2013-09-01 VITALS — BP 128/78 | HR 85 | Temp 98.2°F | Resp 12 | Wt 160.0 lb

## 2013-09-01 DIAGNOSIS — E785 Hyperlipidemia, unspecified: Secondary | ICD-10-CM

## 2013-09-01 DIAGNOSIS — M545 Low back pain, unspecified: Secondary | ICD-10-CM | POA: Insufficient documentation

## 2013-09-01 DIAGNOSIS — M79604 Pain in right leg: Secondary | ICD-10-CM | POA: Insufficient documentation

## 2013-09-01 DIAGNOSIS — M79605 Pain in left leg: Secondary | ICD-10-CM

## 2013-09-01 DIAGNOSIS — Z79899 Other long term (current) drug therapy: Secondary | ICD-10-CM | POA: Insufficient documentation

## 2013-09-01 MED ORDER — HYDROCODONE-ACETAMINOPHEN 5-325 MG PO TABS: 1.0000 | ORAL_TABLET | Freq: Four times a day (QID) | ORAL | Status: DC | PRN

## 2013-09-01 MED ORDER — ATORVASTATIN CALCIUM 40 MG PO TABS: 40.0000 mg | ORAL_TABLET | Freq: Every day | ORAL | Status: DC

## 2013-09-01 MED ORDER — CYCLOBENZAPRINE HCL 5 MG PO TABS: 5.0000 mg | ORAL_TABLET | Freq: Three times a day (TID) | ORAL | Status: DC | PRN

## 2013-09-01 NOTE — Assessment & Plan Note (Signed)
Patient reports running out of his cholesterol and blood pressure medication advised that this requires a followup appointment to monitor labs, etc. Provided patient with 1 refill and set him up with appointment with his PCP on February 2 at 11:30 AM.

## 2013-09-01 NOTE — Patient Instructions (Signed)
   Take ibuprofen 600 mg every 6 hours for the next 4 days. Take with food.  Take hydrocodone every 6 hours as needed for pain.  Also take flexeril for muscle spasms. This will make you sleepy so only take this at bedtime when you are working.  Apply ice alternating with heat to the affected areas for 15 min at a time. Do this approximately 3-4 times daily.  Use a firm pillow between your knees when you lie on your side or under your knees when you lie on your back.  A chiropractor may also be beneficial for the low back pain.  Return to clinic if your symptoms are not improving within 4 weeks or sooner if your symptoms worsen.

## 2013-09-01 NOTE — Progress Notes (Signed)
Pre visit review using our clinic review tool, if applicable. No additional management support is needed unless otherwise documented below in the visit note. 

## 2013-09-01 NOTE — Assessment & Plan Note (Signed)
Start ibuprofen 600 mg every 6 hours x4 days. Flexeril. Hydrocodone for pain every 6 hours as needed. Ice alternating with heat. Please refer to patient instructions for further plan of care.

## 2013-09-01 NOTE — Progress Notes (Signed)
   Subjective:    Patient ID: Randy ParsonsElamin E Grant, male    DOB: 08/31/1965, 48 y.o.   MRN: [id number]  HPI  Pt is a pleasant 48 y/o male who presents with low back pain with radiation down both legs. Pain has been ongoing for 4 days. He reports injury approximately 3 months ago to his low back area. This resolved; however, now has flareup. He has been taking 800 mg of ibuprofen every 12 hours. He reports inability to sleep secondary to pain. He has been applying BenGay to his low back. Pain is aggravated by sitting. It improved with walking.  He reports almost being out of his cholesterol medication and requiring refills. He is also running low on his blood pressure medicine.  Current Outpatient Prescriptions on File Prior to Visit  Medication Sig Dispense Refill  . aspirin 81 MG tablet Take 81 mg by mouth daily.        Marland Kitchen. glucose blood (ONE TOUCH ULTRA TEST) test strip Use as instructed  100 each  12  . glucose blood test strip 1 each by Other route as needed. To test glucose levels.      . insulin aspart (NOVOLOG FLEXPEN) 100 UNIT/ML injection Inject 10 units before a meal - 2x a day  5 pen  11  . insulin glargine (LANTUS) 100 UNIT/ML injection Inject 30 units daily - pen  15 mL  11  . Insulin Pen Needle 31G X 8 MM MISC Test blood sugars two times daily  100 each  11  . lisinopril (PRINIVIL,ZESTRIL) 10 MG tablet Take 1 tablet by mouth daily.  30 tablet  0  . metFORMIN (GLUCOPHAGE XR) 500 MG 24 hr tablet Take 2 tablets (1,000 mg total) by mouth 2 (two) times daily with a meal. 1 tablet by mouth with main meals of the day  120 tablet  11  . Multiple Vitamin (MULTIVITAMIN) tablet Take 1 tablet by mouth daily.         No current facility-administered medications on file prior to visit.     Review of Systems  Constitutional: Negative.   Respiratory: Negative.   Cardiovascular: Negative.   Musculoskeletal: Positive for back pain.       Low back pain radiating down both legs  Neurological:  Negative.  Negative for numbness.       Objective:   Physical Exam  Constitutional: He is oriented to person, place, and time. He appears well-developed and well-nourished.  Appears to be in pain and uncomfortable  Cardiovascular: Normal rate and regular rhythm.   Pulmonary/Chest: Effort normal. No respiratory distress.  Musculoskeletal:  Point tenderness to lower back area of L4-L5. Positive straight leg test bilaterally. Pain exhibited with lifting the right leg to 45. Pain exhibited with lifting left leg to approximately 60. Guarded movements. Difficulty moving from sitting to standing position.  Neurological: He is alert and oriented to person, place, and time.    BP 128/78  Pulse 85  Temp(Src) 98.2 F (36.8 C) (Oral)  Resp 12  Wt 160 lb (72.576 kg)  SpO2 97%       Assessment & Plan:

## 2013-09-11 ENCOUNTER — Ambulatory Visit (INDEPENDENT_AMBULATORY_CARE_PROVIDER_SITE_OTHER): Payer: 59 | Admitting: Internal Medicine

## 2013-09-11 ENCOUNTER — Encounter: Payer: Self-pay | Admitting: Internal Medicine

## 2013-09-11 VITALS — BP 120/74 | HR 108 | Temp 98.4°F | Resp 20 | Ht 67.5 in | Wt 159.0 lb

## 2013-09-11 DIAGNOSIS — M545 Low back pain, unspecified: Secondary | ICD-10-CM

## 2013-09-11 DIAGNOSIS — E1165 Type 2 diabetes mellitus with hyperglycemia: Secondary | ICD-10-CM

## 2013-09-11 DIAGNOSIS — IMO0002 Reserved for concepts with insufficient information to code with codable children: Secondary | ICD-10-CM

## 2013-09-11 DIAGNOSIS — E785 Hyperlipidemia, unspecified: Secondary | ICD-10-CM

## 2013-09-11 DIAGNOSIS — I10 Essential (primary) hypertension: Secondary | ICD-10-CM

## 2013-09-11 DIAGNOSIS — Z79899 Other long term (current) drug therapy: Secondary | ICD-10-CM

## 2013-09-11 DIAGNOSIS — E118 Type 2 diabetes mellitus with unspecified complications: Secondary | ICD-10-CM

## 2013-09-11 LAB — HM DIABETES FOOT EXAM: HM Diabetic Foot Exam: NORMAL

## 2013-09-11 LAB — LDL CHOLESTEROL, DIRECT: LDL DIRECT: 141 mg/dL

## 2013-09-11 MED ORDER — GLUCOSE BLOOD VI STRP: ORAL_STRIP | Status: AC

## 2013-09-11 MED ORDER — LISINOPRIL 10 MG PO TABS: ORAL_TABLET | ORAL | Status: DC

## 2013-09-11 MED ORDER — TRAMADOL HCL 50 MG PO TABS: 50.0000 mg | ORAL_TABLET | Freq: Three times a day (TID) | ORAL | Status: DC | PRN

## 2013-09-11 MED ORDER — ATORVASTATIN CALCIUM 40 MG PO TABS: 40.0000 mg | ORAL_TABLET | Freq: Every day | ORAL | Status: DC

## 2013-09-11 MED ORDER — CYCLOBENZAPRINE HCL 10 MG PO TABS: 10.0000 mg | ORAL_TABLET | Freq: Three times a day (TID) | ORAL | Status: DC | PRN

## 2013-09-11 MED ORDER — ETODOLAC ER 600 MG PO TB24: 600.0000 mg | ORAL_TABLET | Freq: Every day | ORAL | Status: DC

## 2013-09-11 NOTE — Progress Notes (Signed)
Pre-visit discussion using our clinic review tool. No additional management support is needed unless otherwise documented below in the visit note.  

## 2013-09-11 NOTE — Progress Notes (Signed)
Patient ID: Randy Grant, male   DOB: 31-Dec-1965, 48 y.o.   MRN: [id number]   Patient Active Problem List   Diagnosis Date Noted  . Low back pain radiating to both legs 09/01/2013  . Medication management 09/01/2013  . Screening for prostate cancer 12/10/2012  . Right foot pain 10/27/2011  . Lumbago 10/27/2011  . Diabetes mellitus type 2 with complications, uncontrolled 04/25/2007  . DYSLIPIDEMIA 04/25/2007  . HYPERTENSION, BENIGN ESSENTIAL 04/25/1999    Subjective:  CC:   Chief Complaint  Patient presents with  . Follow-up    HPI:   Randy Murakami Elhussainis a 48 y.o. male who presents for follow up on Uncontrolled DM, referred to Dr Renne Crigler after last visit for progressive loss of control.  His December 2014 A1c rose to 10.1 up from 9.4. Now on lantus 30 unit daily ,  novolog   tid with  meals and metformin  tid ,  Next ov 2/12   He has persistent back pain which started after  lifting something heavy.  He was treated with  NSAIDs  and his symptoms have improved but  not resolved. The pain is aggravated by lifting things overhead,  Driving,  And by sitting.  The pain is located in the lft lumbar lateral region  And is waking him up at night.  He has had no prior films and no history of trauma. .    Past Medical History  Diagnosis Date  . Hyperlipidemia   . Hypertension   . Diabetes mellitus     History reviewed. No pertinent past surgical history.     The following portions of the patient's history were reviewed and updated as appropriate: Allergies, current medications, and problem list.    Review of Systems:   12 Pt  review of systems was negative except those addressed in the HPI,     History   Social History  . Marital Status: Married    Spouse Name: N/A    Number of Children: N/A  . Years of Education: N/A   Occupational History  . Not on file.   Social History Main Topics  . Smoking status: Former Smoker    Quit date: 06/25/2001  . Smokeless  tobacco: Never Used  . Alcohol Use: No  . Drug Use: No  . Sexual Activity: Not on file   Other Topics Concern  . Not on file   Social History Narrative  . No narrative on file    Objective:  Filed Vitals:   09/11/13 1147  BP: 120/74  Pulse: 108  Temp: 98.4 F (36.9 C)  Resp: 20     General appearance: alert, cooperative and appears stated age Ears: normal TM's and external ear canals both ears Throat: lips, mucosa, and tongue normal; teeth and gums normal Neck: no adenopathy, no carotid bruit, supple, symmetrical, trachea midline and thyroid not enlarged, symmetric, no tenderness/mass/nodules Back: symmetric, no curvature. ROM normal. No CVA tenderness. Lungs: clear to auscultation bilaterally Heart: regular rate and rhythm, S1, S2 normal, no murmur, click, rub or gallop Abdomen: soft, non-tender; bowel sounds normal; no masses,  no organomegaly Pulses: 2+ and symmetric Skin: Skin color, texture, turgor normal. No rashes or lesions Back : no vertebral tenderness, prominent  paraspinus muscle spasm  And tenderness Lymph nodes: Cervical, supraclavicular, and axillary nodes normal. Foot exam:  Nails are well trimmed,  No callouses,  Sensation intact to microfilament   Assessment and Plan:  DYSLIPIDEMIA Not at goal on lipitor 40 mg  daily,  Increasing dose to 80 mg.   Lumbago Recurrent, prior episode a year ago.  With left side paraspinus muscle spasm noted on exam.  Adding MR. Continue NSAID daily,  Tramadol prn and strengthening exercises.  lumbar spine films ordered.   HYPERTENSION, BENIGN ESSENTIAL Well controlled on current regimen. Renal function stable, no changes today.  Diabetes mellitus type 2 with complications, uncontrolled Given his fasting glucose of 230 and I have recommended that he increase his Lantus to 33 units and follow up with Dr Renne Crigler .   Updated Medication List Outpatient Encounter Prescriptions as of 09/11/2013  Medication Sig  . aspirin 81  MG tablet Take 81 mg by mouth daily.    Marland Kitchen atorvastatin (LIPITOR) 80 MG tablet Take 1 tablet (80 mg total) by mouth daily.  . cyclobenzaprine (FLEXERIL) 10 MG tablet Take 1 tablet (10 mg total) by mouth 3 (three) times daily as needed for muscle spasms.  Marland Kitchen glucose blood (ONE TOUCH ULTRA TEST) test strip Use as instructed three times daily to check blood sugars  . glucose blood test strip 1 each by Other route as needed. To test glucose levels.  Marland Kitchen HYDROcodone-acetaminophen (NORCO/VICODIN) 5-325 MG per tablet Take 1 tablet by mouth every 6 (six) hours as needed.  . insulin aspart (NOVOLOG FLEXPEN) 100 UNIT/ML injection Inject 10 units before a meal - 2x a day  . insulin glargine (LANTUS) 100 UNIT/ML injection Inject 30 units daily - pen  . Insulin Pen Needle 31G X 8 MM MISC Test blood sugars two times daily  . lisinopril (PRINIVIL,ZESTRIL) 10 MG tablet Take 1 tablet by mouth daily.  . metFORMIN (GLUCOPHAGE XR) 500 MG 24 hr tablet Take 2 tablets (1,000 mg total) by mouth 2 (two) times daily with a meal. 1 tablet by mouth with main meals of the day  . Multiple Vitamin (MULTIVITAMIN) tablet Take 1 tablet by mouth daily.    . ONE TOUCH ULTRA TEST test strip Use as instructed  . [DISCONTINUED] atorvastatin (LIPITOR) 40 MG tablet Take 1 tablet (40 mg total) by mouth daily.  . [DISCONTINUED] atorvastatin (LIPITOR) 40 MG tablet Take 1 tablet (40 mg total) by mouth daily.  . [DISCONTINUED] cyclobenzaprine (FLEXERIL) 5 MG tablet Take 1 tablet (5 mg total) by mouth 3 (three) times daily as needed for muscle spasms.  . [DISCONTINUED] glucose blood (ONE TOUCH ULTRA TEST) test strip Use as instructed  . [DISCONTINUED] lisinopril (PRINIVIL,ZESTRIL) 10 MG tablet Take 1 tablet by mouth daily.  Marland Kitchen etodolac (LODINE XL) 600 MG 24 hr tablet Take 1 tablet (600 mg total) by mouth daily.  . traMADol (ULTRAM) 50 MG tablet Take 1 tablet (50 mg total) by mouth every 8 (eight) hours as needed.     Orders Placed This  Encounter  Procedures  . DG Lumbar Spine Complete  . Comp Met (CMET)  . LDL cholesterol, direct    No Follow-up on file.

## 2013-09-11 NOTE — Patient Instructions (Addendum)
I am changing your ibuprofen to lodine XR because it is longer acting and can be taken once daily  Adding tramadol for pain control.  Up to 3 daily   Increase the flexeril to 10 mg before bedtime for muscle spasm  All 3 have been sent to [facility]   Plain x rays of lumbar spine , to be done at [facility]  A sprain is an injury in which a ligament is torn. The ligaments of the lower back are vulnerable to sprains. However, they are strong and require great force to be injured. These ligaments are important for stabilizing the spinal column. Sprains are classified into three categories. Grade 1 sprains cause pain, but the tendon is not lengthened. Grade 2 sprains include a lengthened ligament, due to the ligament being stretched or partially ruptured. With grade 2 sprains there is still function, although the function may be decreased. Grade 3 sprains involve a complete tear of the tendon or muscle, and function is usually impaired. SYMPTOMS   Severe pain in the lower back.  Sometimes, a feeling of a "pop," "snap," or tear, at the time of injury.  Tenderness and sometimes swelling at the injury site.  Uncommonly, bruising (contusion) within 48 hours of injury.  Muscle spasms in the back. CAUSES  Low back sprains occur when a force is placed on the ligaments that is greater than they can handle. Common causes of injury include:  Performing a stressful act while off-balance.  Repetitive stressful activities that involve movement of the lower back.  Direct hit (trauma) to the lower back. RISK INCREASES WITH:  Contact sports (football, wrestling).  Collisions (major skiing accidents).  Sports that require throwing or lifting (baseball, weightlifting).  Sports involving twisting of the spine (gymnastics, diving, tennis, golf).  Poor strength and flexibility.  Inadequate protection.  Previous back injury or surgery (especially  fusion). PREVENTION  Wear properly fitted and padded protective equipment.  Warm up and stretch properly before activity.  Allow for adequate recovery between workouts.  Maintain physical fitness:  Strength, flexibility, and endurance.  Cardiovascular fitness.  Maintain a healthy body weight. PROGNOSIS  If treated properly, low back sprains usually heal with non-surgical treatment. The length of time for healing depends on the severity of the injury.  RELATED COMPLICATIONS   Recurring symptoms, resulting in a chronic problem.  Chronic inflammation and pain in the low back.  Delayed healing or resolution of symptoms, especially if activity is resumed too soon.  Prolonged impairment.  Unstable or arthritic joints of the low back. TREATMENT  Treatment first involves the use of ice and medicine, to reduce pain and inflammation. The use of strengthening and stretching exercises may help reduce pain with activity. These exercises may be performed at home or with a therapist. Severe injuries may require referral to a therapist for further evaluation and treatment, such as ultrasound. Your caregiver may advise that you wear a back brace or corset, to help reduce pain and discomfort. Often, prolonged bed rest results in greater harm then benefit. Corticosteroid injections may be recommended. However, these should be reserved for the most serious cases. It is important to avoid using your back when lifting objects. At night, sleep on your back on a firm mattress, with a pillow placed under your knees. If non-surgical treatment is unsuccessful, surgery may be needed.  MEDICATION   If pain medicine is needed, nonsteroidal anti-inflammatory medicines (aspirin and ibuprofen), or other minor pain relievers (acetaminophen), are  often advised.  Do not take pain medicine for 7 days before surgery.  Prescription pain relievers may be given, if your caregiver thinks they are needed. Use only as  directed and only as much as you need.  Ointments applied to the skin may be helpful.  Corticosteroid injections may be given by your caregiver. These injections should be reserved for the most serious cases, because they may only be given a certain number of times. HEAT AND COLD  Cold treatment (icing) should be applied for 10 to 15 minutes every 2 to 3 hours for inflammation and pain, and immediately after activity that aggravates your symptoms. Use ice packs or an ice massage.  Heat treatment may be used before performing stretching and strengthening activities prescribed by your caregiver, physical therapist, or athletic trainer. Use a heat pack or a warm water soak. SEEK MEDICAL CARE IF:   Symptoms get worse or do not improve in 2 to 4 weeks, despite treatment.  You develop numbness or weakness in either leg.  You lose bowel or bladder function.  Any of the following occur after surgery: fever, increased pain, swelling, redness, drainage of fluids, or bleeding in the affected area.  New, unexplained symptoms develop. (Drugs used in treatment may produce side effects.) EXERCISES  RANGE OF MOTION (ROM) AND STRETCHING EXERCISES - Low Back Sprain Most people with lower back pain will find that their symptoms get worse with excessive bending forward (flexion) or arching at the lower back (extension). The exercises that will help resolve your symptoms will focus on the opposite motion.  Your physician, physical therapist or athletic trainer will help you determine which exercises will be most helpful to resolve your lower back pain. Do not complete any exercises without first consulting with your caregiver. Discontinue any exercises which make your symptoms worse, until you speak to your caregiver. If you have pain, numbness or tingling which travels down into your buttocks, leg or foot, the goal of the therapy is for these symptoms to move closer to your back and eventually resolve.  Sometimes, these leg symptoms will get better, but your lower back pain may worsen. This is often an indication of progress in your rehabilitation. Be very alert to any changes in your symptoms and the activities in which you participated in the 24 hours prior to the change. Sharing this information with your caregiver will allow him or her to most efficiently treat your condition. These exercises may help you when beginning to rehabilitate your injury. Your symptoms may resolve with or without further involvement from your physician, physical therapist or athletic trainer. While completing these exercises, remember:   Restoring tissue flexibility helps normal motion to return to the joints. This allows healthier, less painful movement and activity.  An effective stretch should be held for at least 30 seconds.  A stretch should never be painful. You should only feel a gentle lengthening or release in the stretched tissue. FLEXION RANGE OF MOTION AND STRETCHING EXERCISES: STRETCH  Flexion, Single Knee to Chest   Lie on a firm bed or floor with both legs extended in front of you.  Keeping one leg in contact with the floor, bring your opposite knee to your chest. Hold your leg in place by either grabbing behind your thigh or at your knee.  Pull until you feel a gentle stretch in your low back. Hold __________ seconds.  Slowly release your grasp and repeat the exercise with the opposite side. Repeat __________ times. Complete this  exercise __________ times per day.  STRETCH  Flexion, Double Knee to Chest  Lie on a firm bed or floor with both legs extended in front of you.  Keeping one leg in contact with the floor, bring your opposite knee to your chest.  Tense your stomach muscles to support your back and then lift your other knee to your chest. Hold your legs in place by either grabbing behind your thighs or at your knees.  Pull both knees toward your chest until you feel a gentle stretch in  your low back. Hold __________ seconds.  Tense your stomach muscles and slowly return one leg at a time to the floor. Repeat __________ times. Complete this exercise __________ times per day.  STRETCH  Low Trunk Rotation  Lie on a firm bed or floor. Keeping your legs in front of you, bend your knees so they are both pointed toward the ceiling and your feet are flat on the floor.  Extend your arms out to the side. This will stabilize your upper body by keeping your shoulders in contact with the floor.  Gently and slowly drop both knees together to one side until you feel a gentle stretch in your low back. Hold for __________ seconds.  Tense your stomach muscles to support your lower back as you bring your knees back to the starting position. Repeat the exercise to the other side. Repeat __________ times. Complete this exercise __________ times per day  EXTENSION RANGE OF MOTION AND FLEXIBILITY EXERCISES: STRETCH  Extension, Prone on Elbows   Lie on your stomach on the floor, a bed will be too soft. Place your palms about shoulder width apart and at the height of your head.  Place your elbows under your shoulders. If this is too painful, stack pillows under your chest.  Allow your body to relax so that your hips drop lower and make contact more completely with the floor.  Hold this position for __________ seconds.  Slowly return to lying flat on the floor. Repeat __________ times. Complete this exercise __________ times per day.  RANGE OF MOTION  Extension, Prone Press Ups  Lie on your stomach on the floor, a bed will be too soft. Place your palms about shoulder width apart and at the height of your head.  Keeping your back as relaxed as possible, slowly straighten your elbows while keeping your hips on the floor. You may adjust the placement of your hands to maximize your comfort. As you gain motion, your hands will come more underneath your shoulders.  Hold this position __________  seconds.  Slowly return to lying flat on the floor. Repeat __________ times. Complete this exercise __________ times per day.  RANGE OF MOTION- Quadruped, Neutral Spine   Assume a hands and knees position on a firm surface. Keep your hands under your shoulders and your knees under your hips. You may place padding under your knees for comfort.  Drop your head and point your tailbone toward the ground below you. This will round out your lower back like an angry cat. Hold this position for __________ seconds.  Slowly lift your head and release your tail bone so that your back sags into a large arch, like an old horse.  Hold this position for __________ seconds.  Repeat this until you feel limber in your low back.  Now, find your "sweet spot." This will be the most comfortable position somewhere between the two previous positions. This is your neutral spine. Once you have  found this position, tense your stomach muscles to support your low back.  Hold this position for __________ seconds. Repeat __________ times. Complete this exercise __________ times per day.  STRENGTHENING EXERCISES - Low Back Sprain These exercises may help you when beginning to rehabilitate your injury. These exercises should be done near your "sweet spot." This is the neutral, low-back arch, somewhere between fully rounded and fully arched, that is your least painful position. When performed in this safe range of motion, these exercises can be used for people who have either a flexion or extension based injury. These exercises may resolve your symptoms with or without further involvement from your physician, physical therapist or athletic trainer. While completing these exercises, remember:   Muscles can gain both the endurance and the strength needed for everyday activities through controlled exercises.  Complete these exercises as instructed by your physician, physical therapist or athletic trainer. Increase the  resistance and repetitions only as guided.  You may experience muscle soreness or fatigue, but the pain or discomfort you are trying to eliminate should never worsen during these exercises. If this pain does worsen, stop and make certain you are following the directions exactly. If the pain is still present after adjustments, discontinue the exercise until you can discuss the trouble with your caregiver. STRENGTHENING Deep Abdominals, Pelvic Tilt   Lie on a firm bed or floor. Keeping your legs in front of you, bend your knees so they are both pointed toward the ceiling and your feet are flat on the floor.  Tense your lower abdominal muscles to press your low back into the floor. This motion will rotate your pelvis so that your tail bone is scooping upwards rather than pointing at your feet or into the floor. With a gentle tension and even breathing, hold this position for __________ seconds. Repeat __________ times. Complete this exercise __________ times per day.  STRENGTHENING  Abdominals, Crunches   Lie on a firm bed or floor. Keeping your legs in front of you, bend your knees so they are both pointed toward the ceiling and your feet are flat on the floor. Cross your arms over your chest.  Slightly tip your chin down without bending your neck.  Tense your abdominals and slowly lift your trunk high enough to just clear your shoulder blades. Lifting higher can put excessive stress on the lower back and does not further strengthen your abdominal muscles.  Control your return to the starting position. Repeat __________ times. Complete this exercise __________ times per day.  STRENGTHENING  Quadruped, Opposite UE/LE Lift   Assume a hands and knees position on a firm surface. Keep your hands under your shoulders and your knees under your hips. You may place padding under your knees for comfort.  Find your neutral spine and gently tense your abdominal muscles so that you can maintain this  position. Your shoulders and hips should form a rectangle that is parallel with the floor and is not twisted.  Keeping your trunk steady, lift your right hand no higher than your shoulder and then your left leg no higher than your hip. Make sure you are not holding your breath. Hold this position for __________ seconds.  Continuing to keep your abdominal muscles tense and your back steady, slowly return to your starting position. Repeat with the opposite arm and leg. Repeat __________ times. Complete this exercise __________ times per day.  STRENGTHENING  Abdominals and Quadriceps, Straight Leg Raise   Lie on a firm bed or  floor with both legs extended in front of you.  Keeping one leg in contact with the floor, bend the other knee so that your foot can rest flat on the floor.  Find your neutral spine, and tense your abdominal muscles to maintain your spinal position throughout the exercise.  Slowly lift your straight leg off the floor about 6 inches for a count of 15, making sure to not hold your breath.  Still keeping your neutral spine, slowly lower your leg all the way to the floor. Repeat this exercise with each leg __________ times. Complete this exercise __________ times per day. POSTURE AND BODY MECHANICS CONSIDERATIONS - Low Back Sprain Keeping correct posture when sitting, standing or completing your activities will reduce the stress put on different body tissues, allowing injured tissues a chance to heal and limiting painful experiences. The following are general guidelines for improved posture. Your physician or physical therapist will provide you with any instructions specific to your needs. While reading these guidelines, remember:  The exercises prescribed by your provider will help you have the flexibility and strength to maintain correct postures.  The correct posture provides the best environment for your joints to work. All of your joints have less wear and tear when  properly supported by a spine with good posture. This means you will experience a healthier, less painful body.  Correct posture must be practiced with all of your activities, especially prolonged sitting and standing. Correct posture is as important when doing repetitive low-stress activities (typing) as it is when doing a single heavy-load activity (lifting). RESTING POSITIONS Consider which positions are most painful for you when choosing a resting position. If you have pain with flexion-based activities (sitting, bending, stooping, squatting), choose a position that allows you to rest in a less flexed posture. You would want to avoid curling into a fetal position on your side. If your pain worsens with extension-based activities (prolonged standing, working overhead), avoid resting in an extended position such as sleeping on your stomach. Most people will find more comfort when they rest with their spine in a more neutral position, neither too rounded nor too arched. Lying on a non-sagging bed on your side with a pillow between your knees, or on your back with a pillow under your knees will often provide some relief. Keep in mind, being in any one position for a prolonged period of time, no matter how correct your posture, can still lead to stiffness. PROPER SITTING POSTURE In order to minimize stress and discomfort on your spine, you must sit with correct posture. Sitting with good posture should be effortless for a healthy body. Returning to good posture is a gradual process. Many people can work toward this most comfortably by using various supports until they have the flexibility and strength to maintain this posture on their own. When sitting with proper posture, your ears will fall over your shoulders and your shoulders will fall over your hips. You should use the back of the chair to support your upper back. Your lower back will be in a neutral position, just slightly arched. You may place a small  pillow or folded towel at the base of your lower back for  support.  When working at a desk, create an environment that supports good, upright posture. Without extra support, muscles tire, which leads to excessive strain on joints and other tissues. Keep these recommendations in mind: CHAIR:  A chair should be able to slide under your desk when your back  makes contact with the back of the chair. This allows you to work closely.  The chair's height should allow your eyes to be level with the upper part of your monitor and your hands to be slightly lower than your elbows. BODY POSITION  Your feet should make contact with the floor. If this is not possible, use a foot rest.  Keep your ears over your shoulders. This will reduce stress on your neck and low back. INCORRECT SITTING POSTURES  If you are feeling tired and unable to assume a healthy sitting posture, do not slouch or slump. This puts excessive strain on your back tissues, causing more damage and pain. Healthier options include:  Using more support, like a lumbar pillow.  Switching tasks to something that requires you to be upright or walking.  Talking a brief walk.  Lying down to rest in a neutral-spine position. PROLONGED STANDING WHILE SLIGHTLY LEANING FORWARD  When completing a task that requires you to lean forward while standing in one place for a long time, place either foot up on a stationary 2-4 inch high object to help maintain the best posture. When both feet are on the ground, the lower back tends to lose its slight inward curve. If this curve flattens (or becomes too large), then the back and your other joints will experience too much stress, tire more quickly, and can cause pain. CORRECT STANDING POSTURES Proper standing posture should be assumed with all daily activities, even if they only take a few moments, like when brushing your teeth. As in sitting, your ears should fall over your shoulders and your shoulders should  fall over your hips. You should keep a slight tension in your abdominal muscles to brace your spine. Your tailbone should point down to the ground, not behind your body, resulting in an over-extended swayback posture.  INCORRECT STANDING POSTURES  Common incorrect standing postures include a forward head, locked knees and/or an excessive swayback. WALKING Walk with an upright posture. Your ears, shoulders and hips should all line-up. PROLONGED ACTIVITY IN A FLEXED POSITION When completing a task that requires you to bend forward at your waist or lean over a low surface, try to find a way to stabilize 3 out of 4 of your limbs. You can place a hand or elbow on your thigh or rest a knee on the surface you are reaching across. This will provide you more stability, so that your muscles do not tire as quickly. By keeping your knees relaxed, or slightly bent, you will also reduce stress across your lower back. CORRECT LIFTING TECHNIQUES DO :  Assume a wide stance. This will provide you more stability and the opportunity to get as close as possible to the object which you are lifting.  Tense your abdominals to brace your spine. Bend at the knees and hips. Keeping your back locked in a neutral-spine position, lift using your leg muscles. Lift with your legs, keeping your back straight.  Test the weight of unknown objects before attempting to lift them.  Try to keep your elbows locked down at your sides in order get the best strength from your shoulders when carrying an object.  Always ask for help when lifting heavy or awkward objects. INCORRECT LIFTING TECHNIQUES DO NOT:   Lock your knees when lifting, even if it is a small object.  Bend and twist. Pivot at your feet or move your feet when needing to change directions.  Assume that you can safely pick up even  a paperclip without proper posture. Document Released: 07/27/2005 Document Revised: 10/19/2011 Document Reviewed: 11/08/2008 [facility]  Patient Information 2014 [place], [place].

## 2013-09-12 LAB — COMPREHENSIVE METABOLIC PANEL
ALT: 32 U/L (ref 0–53)
AST: 21 U/L (ref 0–37)
Albumin: 4.1 g/dL (ref 3.5–5.2)
Alkaline Phosphatase: 71 U/L (ref 39–117)
BUN: 13 mg/dL (ref 6–23)
CALCIUM: 9.5 mg/dL (ref 8.4–10.5)
CHLORIDE: 103 meq/L (ref 96–112)
CO2: 25 meq/L (ref 19–32)
Creatinine, Ser: 0.7 mg/dL (ref 0.4–1.5)
GFR: 133.72 mL/min (ref >60.00)
Glucose, Bld: 230 mg/dL — ABNORMAL HIGH (ref 70–99)
Potassium: 4.2 mEq/L (ref 3.5–5.1)
Sodium: 138 mEq/L (ref 135–145)
Total Bilirubin: 0.5 mg/dL (ref 0.3–1.2)
Total Protein: 7.1 g/dL (ref 6.0–8.3)

## 2013-09-13 ENCOUNTER — Encounter: Payer: Self-pay | Admitting: Internal Medicine

## 2013-09-13 MED ORDER — ATORVASTATIN CALCIUM 80 MG PO TABS: 80.0000 mg | ORAL_TABLET | Freq: Every day | ORAL | Status: DC

## 2013-09-13 NOTE — Assessment & Plan Note (Addendum)
Recurrent, prior episode a year ago.  With left side paraspinus muscle spasm noted on exam.  Adding MR. Continue NSAID daily,  Tramadol prn and strengthening exercises.  lumbar spine films ordered.

## 2013-09-13 NOTE — Assessment & Plan Note (Signed)
Well controlled on current regimen. Renal function stable, no changes today. 

## 2013-09-13 NOTE — Assessment & Plan Note (Addendum)
Not at goal on lipitor 40 mg daily,  Increasing dose to 80 mg.

## 2013-09-13 NOTE — Assessment & Plan Note (Signed)
Given his fasting glucose of 230 and I have recommended that he increase his Lantus to 33 units and follow up with Dr Lafe GarinGherge .

## 2013-09-19 ENCOUNTER — Encounter: Payer: Self-pay | Admitting: *Deleted

## 2014-01-15 ENCOUNTER — Other Ambulatory Visit: Payer: Self-pay | Admitting: Internal Medicine

## 2014-01-17 ENCOUNTER — Encounter: Payer: Self-pay | Admitting: Internal Medicine

## 2014-01-17 ENCOUNTER — Ambulatory Visit (INDEPENDENT_AMBULATORY_CARE_PROVIDER_SITE_OTHER): Payer: 59 | Admitting: Internal Medicine

## 2014-01-17 VITALS — BP 112/68 | HR 97 | Temp 98.0°F | Resp 18 | Ht 67.5 in | Wt 160.8 lb

## 2014-01-17 DIAGNOSIS — E1169 Type 2 diabetes mellitus with other specified complication: Secondary | ICD-10-CM

## 2014-01-17 DIAGNOSIS — Z23 Encounter for immunization: Secondary | ICD-10-CM

## 2014-01-17 DIAGNOSIS — IMO0002 Reserved for concepts with insufficient information to code with codable children: Secondary | ICD-10-CM

## 2014-01-17 DIAGNOSIS — E118 Type 2 diabetes mellitus with unspecified complications: Secondary | ICD-10-CM

## 2014-01-17 DIAGNOSIS — Z91199 Patient's noncompliance with other medical treatment and regimen due to unspecified reason: Secondary | ICD-10-CM

## 2014-01-17 DIAGNOSIS — E1165 Type 2 diabetes mellitus with hyperglycemia: Secondary | ICD-10-CM

## 2014-01-17 DIAGNOSIS — N529 Male erectile dysfunction, unspecified: Secondary | ICD-10-CM

## 2014-01-17 DIAGNOSIS — N521 Erectile dysfunction due to diseases classified elsewhere: Secondary | ICD-10-CM

## 2014-01-17 DIAGNOSIS — Z9119 Patient's noncompliance with other medical treatment and regimen: Secondary | ICD-10-CM

## 2014-01-17 LAB — HM DIABETES FOOT EXAM: HM DIABETIC FOOT EXAM: NORMAL

## 2014-01-17 MED ORDER — INSULIN PEN NEEDLE 31G X 8 MM MISC: Status: DC

## 2014-01-17 MED ORDER — SILDENAFIL CITRATE 100 MG PO TABS
50.0000 mg | ORAL_TABLET | Freq: Every day | ORAL | Status: DC | PRN
Start: 2014-01-17 — End: 2014-07-18

## 2014-01-17 NOTE — Progress Notes (Signed)
Patient ID: Randy Grant, male   DOB: June 16, 1966, 48 y.o.   MRN: [id number]  Patient Active Problem List   Diagnosis Date Noted  . Need for immunization against malaria 01/20/2014  . Erectile dysfunction associated with type 2 diabetes mellitus 01/20/2014  . Noncompliance with diabetes treatment 01/20/2014  . Low back pain radiating to both legs 09/01/2013  . Medication management 09/01/2013  . Screening for prostate cancer 12/10/2012  . Right foot pain 10/27/2011  . Lumbago 10/27/2011  . Diabetes mellitus type 2 with complications, uncontrolled 04/25/2007  . DYSLIPIDEMIA 04/25/2007  . HYPERTENSION, BENIGN ESSENTIAL 04/25/1999    Subjective:  CC:   Chief Complaint  Patient presents with  . Follow-up  . Diabetes    HPI:   Randy Grant is a 48 y.o. male who presents for Follow up on poorly controlled DM type 2.  Patient was referred to Endocrinology last year for management.  a1c was 10.1 in December done at [facility]  Did not keep februrary appt  with Dr Lafe Garin because he lost his prior insurance and now has a high deductible.  Not checking the blood sugars because he cannot afford to purchase the test strips. [facility] has reduced the cost of medications, however so he has been using insulin .  Currently taking 30 units  Of lantus and 10 unit novolog bid.    Back pain has resolved .     Past Medical History  Diagnosis Date  . Hyperlipidemia   . Hypertension   . Diabetes mellitus     No past surgical history on file.     The following portions of the patient's history were reviewed and updated as appropriate: Allergies, current medications, and problem list.    Review of Systems:   Patient denies headache, fevers, malaise, unintentional weight loss, skin rash, eye pain, sinus congestion and sinus pain, sore throat, dysphagia,  hemoptysis , cough, dyspnea, wheezing, chest pain, palpitations, orthopnea, edema, abdominal pain, nausea, melena,  diarrhea, constipation, flank pain, dysuria, hematuria, urinary  Frequency, nocturia, numbness, tingling, seizures,  Focal weakness, Loss of consciousness,  Tremor, insomnia, depression, anxiety, and suicidal ideation.     History   Social History  . Marital Status: Married    Spouse Name: Randy Grant    Number of Children: Randy Grant  . Years of Education: Randy Grant   Occupational History  . Not on file.   Social History Main Topics  . Smoking status: Former Smoker    Quit date: 06/25/2001  . Smokeless tobacco: Never Used  . Alcohol Use: No  . Drug Use: No  . Sexual Activity: Not on file   Other Topics Concern  . Not on file   Social History Narrative  . No narrative on file    Objective:  Filed Vitals:   01/17/14 1520  BP: 112/68  Pulse: 97  Temp: 98 F (36.7 C)  Resp: 18     General appearance: alert, cooperative and appears stated age Ears: normal TM's and external ear canals both ears Throat: lips, mucosa, and tongue normal; teeth and gums normal Neck: no adenopathy, no carotid bruit, supple, symmetrical, trachea midline and thyroid not enlarged, symmetric, no tenderness/mass/nodules Back: symmetric, no curvature. ROM normal. No CVA tenderness. Lungs: clear to auscultation bilaterally Heart: regular rate and rhythm, S1, S2 normal, no murmur, click, rub or gallop Abdomen: soft, non-tender; bowel sounds normal; no masses,  no organomegaly Pulses: 2+ and symmetric Skin: Skin color, texture, turgor normal. No rashes or  lesions Lymph nodes: Cervical, supraclavicular, and axillary nodes normal. Foot exam:  Nails are well trimmed,  No callouses,  Sensation intact to microfilament   Assessment and Plan:  Diabetes mellitus type 2 with complications, uncontrolled Poorly controlled due to financial difficulties resulting in nonadherence to testing.   I have recommended that he increase his Lantus to 33 units and [facility].  Will resume management  of DM since he cannot afford to se e Dr Lafe GarinGherge currently.  Advised to purchase test strips online at [facility] [email or web address].  Foot exam was normal.  Continue ACE inhibitor, statin and ASA   Need for immunization against malaria He is returning to the [place] in July to visit family and is requesting malaria prophylaxis.  Referral to Travel medicine made.   Erectile dysfunction associated with type 2 diabetes mellitus He is having difficulty sustaining erections and is requesting medication.  The risks and benefits of medications for ED were discussed today with patient and rx for viagra given.   Updated Medication List Outpatient Encounter Prescriptions as of 01/17/2014  Medication Sig  . aspirin 81 MG tablet Take 81 mg by mouth daily.    Marland Kitchen. atorvastatin (LIPITOR) 80 MG tablet Take 1 tablet (80 mg total) by mouth daily.  Marland Kitchen. glucose blood (ONE TOUCH ULTRA TEST) test strip Use as instructed three times daily to check blood sugars  . glucose blood test strip 1 each by Other route as needed. To test glucose levels.  . insulin aspart (NOVOLOG FLEXPEN) 100 UNIT/ML injection Inject 10 units before a meal - 2x a day  . insulin glargine (LANTUS) 100 UNIT/ML injection Inject 30 units daily - pen  . Insulin Pen Needle (B-D ULTRAFINE III SHORT PEN) 31G X 8 MM MISC Test blood sugars 2 times daily  . lisinopril (PRINIVIL,ZESTRIL) 10 MG tablet Take 1 tablet by mouth daily.  . metFORMIN (GLUCOPHAGE XR) 500 MG 24 hr tablet Take 2 tablets (1,000 mg total) by mouth 2 (two) times daily with a meal. 1 tablet by mouth with main meals of the day  . Multiple Vitamin (MULTIVITAMIN) tablet Take 1 tablet by mouth daily.    . ONE TOUCH ULTRA TEST test strip Use as instructed  . [DISCONTINUED] B-D ULTRAFINE III SHORT PEN 31G X 8 MM MISC Test blood sugars 2 times daily  . sildenafil (VIAGRA) 100 MG tablet Take 0.5-1 tablets (50-100 mg total) by mouth daily as needed for erectile dysfunction.  . [DISCONTINUED] cyclobenzaprine  (FLEXERIL) 10 MG tablet Take 1 tablet (10 mg total) by mouth 3 (three) times daily as needed for muscle spasms.  . [DISCONTINUED] etodolac (LODINE XL) 600 MG 24 hr tablet Take 1 tablet (600 mg total) by mouth daily.  . [DISCONTINUED] HYDROcodone-acetaminophen (NORCO/VICODIN) 5-325 MG per tablet Take 1 tablet by mouth every 6 (six) hours as needed.  . [DISCONTINUED] traMADol (ULTRAM) 50 MG tablet Take 1 tablet (50 mg total) by mouth every 8 (eight) hours as needed.     Orders Placed This Encounter  Procedures  . Hemoglobin A1c  . Ambulatory referral to [facility]  . HM DIABETES EYE EXAM    Return in about 3 months (around 04/19/2014) for follow up diabetes.

## 2014-01-17 NOTE — Patient Instructions (Signed)
[facility] [email or web address]  has affordable diabetes test strips. Please go to their website  Please ask the [facility] to do FASTING labs next week using the letter I have given you  Trial of viagra for your erectile dysfunciton  Referral to travel medicine clinic for your malaria medications

## 2014-01-17 NOTE — Progress Notes (Signed)
Pre-visit discussion using our clinic review tool. No additional management support is needed unless otherwise documented below in the visit note.  

## 2014-01-20 DIAGNOSIS — E1169 Type 2 diabetes mellitus with other specified complication: Secondary | ICD-10-CM | POA: Insufficient documentation

## 2014-01-20 DIAGNOSIS — N521 Erectile dysfunction due to diseases classified elsewhere: Secondary | ICD-10-CM

## 2014-01-20 DIAGNOSIS — Z9119 Patient's noncompliance with other medical treatment and regimen: Secondary | ICD-10-CM | POA: Insufficient documentation

## 2014-01-20 DIAGNOSIS — Z23 Encounter for immunization: Secondary | ICD-10-CM | POA: Insufficient documentation

## 2014-01-20 DIAGNOSIS — Z91199 Patient's noncompliance with other medical treatment and regimen due to unspecified reason: Secondary | ICD-10-CM | POA: Insufficient documentation

## 2014-01-20 NOTE — Assessment & Plan Note (Addendum)
Poorly controlled due to financial difficulties resulting in nonadherence to testing.   I have recommended that he increase his Lantus to 33 units and getting fasting [facility]AP [facility].  Will resume management of DM since he cannot afford to se e Dr Lafe GarinGherge currently.  Advised to purchase test strips online at [facility] [email or web address].  Foot exam was normal.  Continue ACE inhibitor, statin and ASA

## 2014-01-20 NOTE — Assessment & Plan Note (Signed)
He is returning to the [place] in July to visit family and is requesting malaria prophylaxis.  Referral to Travel medicine made.

## 2014-01-20 NOTE — Assessment & Plan Note (Signed)
He is having difficulty sustaining erections and is requesting medication.  The risks and benefits of medications for ED were discussed today with patient and rx for viagra given.

## 2014-02-12 ENCOUNTER — Other Ambulatory Visit: Payer: Self-pay | Admitting: Internal Medicine

## 2014-02-12 ENCOUNTER — Other Ambulatory Visit: Payer: Self-pay | Admitting: *Deleted

## 2014-02-12 DIAGNOSIS — E118 Type 2 diabetes mellitus with unspecified complications: Principal | ICD-10-CM

## 2014-02-12 DIAGNOSIS — E1165 Type 2 diabetes mellitus with hyperglycemia: Secondary | ICD-10-CM

## 2014-02-12 DIAGNOSIS — IMO0002 Reserved for concepts with insufficient information to code with codable children: Secondary | ICD-10-CM

## 2014-02-12 MED ORDER — INSULIN GLARGINE 100 UNIT/ML ~~LOC~~ SOLN: SUBCUTANEOUS | Status: DC

## 2014-03-19 ENCOUNTER — Encounter: Payer: Self-pay | Admitting: *Deleted

## 2014-04-04 ENCOUNTER — Other Ambulatory Visit: Payer: Self-pay | Admitting: Internal Medicine

## 2014-05-07 ENCOUNTER — Telehealth: Payer: Self-pay | Admitting: *Deleted

## 2014-05-07 DIAGNOSIS — IMO0001 Reserved for inherently not codable concepts without codable children: Secondary | ICD-10-CM

## 2014-05-07 DIAGNOSIS — E1165 Type 2 diabetes mellitus with hyperglycemia: Principal | ICD-10-CM

## 2014-05-07 NOTE — Telephone Encounter (Signed)
Pt is coming in tomorrow what labs and dx?  

## 2014-05-08 ENCOUNTER — Other Ambulatory Visit (INDEPENDENT_AMBULATORY_CARE_PROVIDER_SITE_OTHER): Payer: 59

## 2014-05-08 ENCOUNTER — Other Ambulatory Visit: Payer: Self-pay | Admitting: Internal Medicine

## 2014-05-08 DIAGNOSIS — E1165 Type 2 diabetes mellitus with hyperglycemia: Principal | ICD-10-CM

## 2014-05-08 DIAGNOSIS — IMO0001 Reserved for inherently not codable concepts without codable children: Secondary | ICD-10-CM

## 2014-05-08 LAB — COMPREHENSIVE METABOLIC PANEL
ALT: 37 U/L (ref 0–53)
AST: 18 U/L (ref 0–37)
Albumin: 4.2 g/dL (ref 3.5–5.2)
Alkaline Phosphatase: 78 U/L (ref 39–117)
BUN: 16 mg/dL (ref 6–23)
CALCIUM: 9.1 mg/dL (ref 8.4–10.5)
CHLORIDE: 100 meq/L (ref 96–112)
CO2: 27 meq/L (ref 19–32)
CREATININE: 0.8 mg/dL (ref 0.4–1.5)
GFR: 108.69 mL/min (ref >60.00)
Glucose, Bld: 217 mg/dL — ABNORMAL HIGH (ref 70–99)
POTASSIUM: 4.2 meq/L (ref 3.5–5.1)
SODIUM: 133 meq/L — AB (ref 135–145)
TOTAL PROTEIN: 7.5 g/dL (ref 6.0–8.3)
Total Bilirubin: 0.7 mg/dL (ref 0.2–1.2)

## 2014-05-08 LAB — MICROALBUMIN / CREATININE URINE RATIO
Creatinine,U: 173.2 mg/dL
MICROALB/CREAT RATIO: 7.2 mg/g (ref 0.0–30.0)
Microalb, Ur: 12.5 mg/dL — ABNORMAL HIGH (ref 0.0–1.9)

## 2014-05-08 LAB — LIPID PANEL
Cholesterol: 194 mg/dL (ref 0–200)
HDL: 38.8 mg/dL — AB (ref >39.00)
LDL Cholesterol: 128 mg/dL — ABNORMAL HIGH (ref 0–99)
NonHDL: 155.2
TRIGLYCERIDES: 136 mg/dL (ref 0.0–149.0)
Total CHOL/HDL Ratio: 5
VLDL: 27.2 mg/dL (ref 0.0–40.0)

## 2014-05-08 LAB — HEMOGLOBIN A1C: Hgb A1c MFr Bld: 10.9 % — ABNORMAL HIGH (ref 4.6–6.5)

## 2014-05-14 ENCOUNTER — Encounter: Payer: Self-pay | Admitting: *Deleted

## 2014-05-14 ENCOUNTER — Encounter: Payer: Self-pay | Admitting: Internal Medicine

## 2014-05-23 ENCOUNTER — Ambulatory Visit: Payer: 59 | Admitting: Internal Medicine

## 2014-05-30 ENCOUNTER — Ambulatory Visit: Payer: 59 | Admitting: Endocrinology

## 2014-05-30 DIAGNOSIS — Z0289 Encounter for other administrative examinations: Secondary | ICD-10-CM

## 2014-05-31 ENCOUNTER — Other Ambulatory Visit: Payer: Self-pay | Admitting: Internal Medicine

## 2014-06-01 ENCOUNTER — Other Ambulatory Visit: Payer: Self-pay | Admitting: *Deleted

## 2014-06-01 MED ORDER — INSULIN GLARGINE 100 UNIT/ML SOLOSTAR PEN
PEN_INJECTOR | SUBCUTANEOUS | Status: DC
Start: 2014-06-01 — End: 2014-07-24

## 2014-06-13 ENCOUNTER — Encounter: Payer: Self-pay | Admitting: Endocrinology

## 2014-06-13 ENCOUNTER — Ambulatory Visit (INDEPENDENT_AMBULATORY_CARE_PROVIDER_SITE_OTHER): Payer: 59 | Admitting: Endocrinology

## 2014-06-13 VITALS — BP 128/70 | HR 91 | Ht 67.5 in | Wt 164.0 lb

## 2014-06-13 DIAGNOSIS — IMO0002 Reserved for concepts with insufficient information to code with codable children: Secondary | ICD-10-CM

## 2014-06-13 DIAGNOSIS — E785 Hyperlipidemia, unspecified: Secondary | ICD-10-CM

## 2014-06-13 DIAGNOSIS — I10 Essential (primary) hypertension: Secondary | ICD-10-CM

## 2014-06-13 DIAGNOSIS — E118 Type 2 diabetes mellitus with unspecified complications: Secondary | ICD-10-CM

## 2014-06-13 DIAGNOSIS — E1165 Type 2 diabetes mellitus with hyperglycemia: Secondary | ICD-10-CM

## 2014-06-13 LAB — HM DIABETES FOOT EXAM: HM DIABETIC FOOT EXAM: NORMAL

## 2014-06-13 MED ORDER — CANAGLIFLOZIN 100 MG PO TABS: 100.0000 mg | ORAL_TABLET | Freq: Every day | ORAL | Status: DC

## 2014-06-13 NOTE — Patient Instructions (Signed)
Check sugars 4 x daily ( before each meal and at bedtime).  Record them in a log book and bring that/meter to next appointment.   Buy relion meter for use for rest of this year.  Cut back on Pita bread at meals.  Change Lantus insulin to 34 units once daily.  Continue current Novolog 10 units with each meal, lunch and dinner.  Start Invokana at 100 mg daily with breakfast.  Drink lots of water. Return in 2 weeks for non fasting lab draw.   If you start to notice low blood sugar levels < 100, then let me know so that I can adjust the insulin doses.   Please come back for a follow-up appointment in 1 month.

## 2014-06-13 NOTE — Assessment & Plan Note (Signed)
Recent labs show uncontrolled LDL. Work on dietary factors, and reassess at next visit. Consider switch to Crestor is still uncontrolled.

## 2014-06-13 NOTE — Assessment & Plan Note (Signed)
Recent A1c not at target. Discussed importance of better sugar control and minimizing risk of long term complications. Prior follow up limited due to finances.  Asked him to check sugars 4 x daily with store brand meter.   Continue current metformin.  Change Lantus to 34 units daily.  Continue current Novolog dosing.  Discussed addition of SGLT2 inhibitors versus GLP-1. He would like to try the oral option and I have asked him to start Invokana 100mg  daily. Check GFR in 2 weeks. Stay hydrated. Risks and side effects discussed.   He will also work on cutting back starches from his food.   If he notices low sugars, then aware to notify me for further adjustments.   RTC 1 month.

## 2014-06-13 NOTE — Progress Notes (Signed)
Reason for visit-  Randy Grant is a 48 y.o.-year-old male, referred by his PCP,  Sherlene Shams, MD for management of Type 2 diabetes, uncontrolled, with complications ( ? Retinopathy, ED). Seen Dr Elvera Lennox in Jan 2015.   HPI- Patient has been diagnosed with diabetes in 1995. Recalls being initially on lifestyle modifications.  Tried  Metformin, Glipizide in the past. he has  been on insulin since 2012. History of non compliance due to financial reasons in the past.   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 Works at [employer] at [employer].    Last hemoglobin A1c was: Lab Results  Component Value Date   HGBA1C 10.9* 05/08/2014   HGBA1C 10.1* 07/22/2013   HGBA1C 9.4* 03/27/2013     Pt checks his sugars 0-1 a day  lately. Uses relion glucometer. By recall  they are: Changed insurance from next year. In lifestyle program at [facility]. DM supplies expensive and not covered through rest of year, hence has not been checking lately.   PREMEAL Breakfast Lunch Dinner Bedtime Overall  Glucose range: 170 200-350 200-350    Mean/median:        POST-MEAL PC Breakfast PC Lunch PC Dinner  Glucose range:     Mean/median:       Hypoglycemia-  No significant lows. Lowest sugar was 55 about 3 weeks ago; he has hypoglycemia awareness at 70.   Dietary habits- eats three times daily but eats at times per shift work. Tries to limit carbs, sweetened beverages, sodas, desserts. Eats 2 pita bread per meals. Diet sodas occasionally. Eats cheese sandwich, PB crackers at end of shift as BF Exercise- exercises on Treadmill 3 times weekly  Weight - up recently Wt Readings from Last 3 Encounters:  06/13/14 164 lb (74.39 kg)  01/17/14 160 lb 12 oz (72.916 kg)  09/11/13 159 lb (72.122 kg)    Diabetes Complications-   Nephropathy- No  CKD, last BUN/creatinine-  Lab Results  Component Value Date   BUN 16 05/08/2014   CREATININE 0.8 05/08/2014   Lab Results  Component Value Date   GFR 108.69 05/08/2014  . Lab Results  Component Value Date   MICRALBCREAT 7.2 05/08/2014     Retinopathy- Yes?, Last DEE was  1 year ago, Laser treatment in the past 1 years at [facility] eye center. Neuropathy- no numbness and tingling in his feet. No known neuropathy.  Associated history - No CAD . No prior stroke. No hypothyroidism. his last TSH was No results found for: TSH  Hyperlipidemia-  his last set of lipids were- Currently on Lipitor 80 mg daily. Tolerating well.   Lab Results  Component Value Date   CHOL 194 05/08/2014   HDL 38.80* 05/08/2014   LDLCALC 128* 05/08/2014   LDLDIRECT 141.0 09/11/2013   TRIG 136.0 05/08/2014   CHOLHDL 5 05/08/2014    Blood Pressure/HTN- Patient's blood pressure is well controlled today on current regimen that includes ACE-I ( lisinopril).  Pt has FH of DM in Mother.  I have reviewed the patient's past medical history, family and social history, surgical history, medications and allergies.  Past Medical History  Diagnosis Date  . Hyperlipidemia   . Hypertension   . Diabetes mellitus    No past surgical history on  file. History   Social History  . Marital Status: Married    Spouse Name: N/A    Number of Children: N/A  . Years of Education: N/A   Occupational History  . Not on file.   Social History Main Topics  . Smoking status: Former Smoker    Quit date: 06/25/2001  . Smokeless tobacco: Never Used  . Alcohol Use: No  . Drug Use: No  . Sexual Activity: Not on file   Other Topics Concern  . Not on file   Social History Narrative   Current Outpatient Prescriptions on File Prior to Visit  Medication Sig Dispense Refill  . aspirin 81 MG tablet Take 81 mg by mouth daily.      Marland Kitchen. atorvastatin (LIPITOR) 80 MG tablet TAKE 1 TABLET (80 MG TOTAL) BY MOUTH  DAILY. 90 tablet 1  . glucose blood (ONE TOUCH ULTRA TEST) test strip Use as instructed three times daily to check blood sugars 100 each 12  . Insulin Glargine (LANTUS SOLOSTAR) 100 UNIT/ML Solostar Pen INJECT 30 UNITS SUBCUTANEOUSLY DAILY **NEED APPOINTMENT** 15 mL 0  . Insulin Pen Needle (B-D ULTRAFINE III SHORT PEN) 31G X 8 MM MISC Test blood sugars 2 times daily 100 each 5  . lisinopril (PRINIVIL,ZESTRIL) 10 MG tablet TAKE 1 TABLET BY MOUTH DAILY. 90 tablet 1  . metFORMIN (GLUCOPHAGE XR) 500 MG 24 hr tablet Take 2 tablets (1,000 mg total) by mouth 2 (two) times daily with a meal. 1 tablet by mouth with main meals of the day (Patient taking differently: Take 1,000 mg by mouth 2 (two) times daily with a meal. ) 120 tablet 11  . Multiple Vitamin (MULTIVITAMIN) tablet Take 1 tablet by mouth daily.      Marland Kitchen. NOVOLOG FLEXPEN 100 UNIT/ML FlexPen INJECT 10 UNITS BEFORE A MEAL TWICE DAILY AS DIRECTED 15 mL 11  . sildenafil (VIAGRA) 100 MG tablet Take 0.5-1 tablets (50-100 mg total) by mouth daily as needed for erectile dysfunction. 5 tablet 11   No current facility-administered medications on file prior to visit.   No Known Allergies Family History  Problem Relation Age of Onset  . Diabetes Mother   . Diabetes Father       Review of Systems: [x]  complains of  [  ] denies General:   [ x ] Recent weight change [  ] Fatigue  [  ] Loss of appetite Eyes: [  ]  Vision Difficulty [  ]  Eye pain ENT: [  ]  Hearing difficulty [  ]  Difficulty Swallowing CVS: [  ] Chest pain [  ]  Palpitations/Irregular Heart beat [  ]  Shortness of breath lying flat [  ] Swelling of legs Resp: [  ] Frequent Cough [  ] Shortness of Breath  [  ]  Wheezing GI: [  x] Heartburn  [  ] Nausea or Vomiting  [  ] Diarrhea [  ] Constipation  [  ] Abdominal Pain GU: [  ]  Polyuria  [  ]  nocturia Bones/joints:  [  ]  Muscle aches  [  ] Joint Pain  [  ] Bone pain Skin/Hair/Nails: [  ]  Rash  [  ] New stretch marks [  ]  Itching [  ]  Hair loss [  ]  Excessive hair growth Reproduction: [ x ] Low sexual desire , [  ]  Women: Menstrual cycle problems [  ]  Women: Breast Discharge [  ]  Men: Difficulty with erections [  ]  Men: Enlarged Breasts CNS: [  ] Frequent Headaches [  ] Blurry vision [  ] Tremors [  ] Seizures [  ] Loss of consciousness [  ] Localized weakness Endocrine: [  ]  Excess thirst [  ]  Feeling excessively hot [  ]  Feeling excessively cold Heme: [  ]  Easy bruising [  ]  Enlarged glands or lumps in neck Allergy: [  ]  Food allergies [  ] Environmental allergies  PE: BP 128/70 mmHg  Pulse 91  Ht 5' 7.5" (1.715 m)  Wt 164 lb (74.39 kg)  BMI 25.29 kg/m2  SpO2 97% Wt Readings from Last 3 Encounters:  06/13/14 164 lb (74.39 kg)  01/17/14 160 lb 12 oz (72.916 kg)  09/11/13 159 lb (72.122 kg)   GENERAL: No acute distress, well developed HEENT:  Eye exam shows normal external appearance. Oral exam shows normal mucosa .  NECK:   Neck exam shows no lymphadenopathy. No Carotids bruits. Thyroid is not enlarged and no nodules felt.  + acanthosis nigricans LUNGS:         Chest is symmetrical. Lungs are clear to auscultation.Marland Kitchen   HEART:         Heart sounds:  S1 and S2 are normal. No murmurs or clicks heard. ABDOMEN:  No Distention present. Liver and spleen are not palpable. No other mass or tenderness present.  EXTREMITIES:     There is no edema. 2+ DP pulses  NEUROLOGICAL:     Grossly intact.            Diabetic foot exam done with shoes and socks removed: Normal Monofilament testing bilaterally. No deformities of toes.  Nails  Not dystrophic. Skin normal color. No open wounds. Dry skin.  MUSCULOSKELETAL:       There is no enlargement or gross deformity of the joints.  SKIN:       No rash  ASSESSMENT AND PLAN: Problem List Items Addressed This Visit      Cardiovascular and Mediastinum   HYPERTENSION, BENIGN ESSENTIAL    At target today on current regimen. Last urine MA negative Sept 2015.       Endocrine    Diabetes mellitus type 2 with complications, uncontrolled - Primary    Recent A1c not at target. Discussed importance of better sugar control and minimizing risk of long term complications. Prior follow up limited due to finances.  Asked him to check sugars 4 x daily with store brand meter.   Continue current metformin.  Change Lantus to 34 units daily.  Continue current Novolog dosing.  Discussed addition of SGLT2 inhibitors versus GLP-1. He would like to try the oral option and I have asked him to start Invokana 100mg  daily. Check GFR in 2 weeks. Stay hydrated. Risks and side effects discussed.   He will also work on cutting back starches from his food.   If he notices low sugars, then aware to notify me for further adjustments.   RTC 1 month.     Relevant Medications      canagliflozin (INVOKANA) 100 MG TABS tablet   Other Relevant Orders      Basic metabolic panel     Other   Hyperlipidemia    Recent labs show uncontrolled LDL. Work on dietary factors, and reassess at next visit. Consider switch to Crestor is still uncontrolled.         - Return to clinic in 1  mo with sugar log/meter.  [facility] BeverlyUSHKAR 06/13/2014 3:31 PM

## 2014-06-13 NOTE — Progress Notes (Signed)
Pre visit review using our clinic review tool, if applicable. No additional management support is needed unless otherwise documented below in the visit note. 

## 2014-06-13 NOTE — Assessment & Plan Note (Signed)
At target today on current regimen. Last urine MA negative Sept 2015.

## 2014-06-27 ENCOUNTER — Other Ambulatory Visit (INDEPENDENT_AMBULATORY_CARE_PROVIDER_SITE_OTHER): Payer: 59

## 2014-06-27 DIAGNOSIS — IMO0002 Reserved for concepts with insufficient information to code with codable children: Secondary | ICD-10-CM

## 2014-06-27 DIAGNOSIS — E118 Type 2 diabetes mellitus with unspecified complications: Secondary | ICD-10-CM

## 2014-06-27 DIAGNOSIS — E1165 Type 2 diabetes mellitus with hyperglycemia: Secondary | ICD-10-CM

## 2014-06-27 LAB — BASIC METABOLIC PANEL
BUN: 16 mg/dL (ref 6–23)
CHLORIDE: 103 meq/L (ref 96–112)
CO2: 28 mEq/L (ref 19–32)
Calcium: 9.1 mg/dL (ref 8.4–10.5)
Creatinine, Ser: 0.7 mg/dL (ref 0.4–1.5)
GFR: 128.85 mL/min (ref >60.00)
Glucose, Bld: 141 mg/dL — ABNORMAL HIGH (ref 70–99)
POTASSIUM: 4.3 meq/L (ref 3.5–5.1)
SODIUM: 137 meq/L (ref 135–145)

## 2014-07-18 ENCOUNTER — Ambulatory Visit (INDEPENDENT_AMBULATORY_CARE_PROVIDER_SITE_OTHER): Payer: 59 | Admitting: Endocrinology

## 2014-07-18 ENCOUNTER — Encounter: Payer: Self-pay | Admitting: Endocrinology

## 2014-07-18 ENCOUNTER — Telehealth: Payer: Self-pay | Admitting: Internal Medicine

## 2014-07-18 VITALS — BP 120/70 | HR 102 | Temp 98.4°F | Ht 67.5 in | Wt 167.4 lb

## 2014-07-18 DIAGNOSIS — E1165 Type 2 diabetes mellitus with hyperglycemia: Secondary | ICD-10-CM

## 2014-07-18 DIAGNOSIS — IMO0002 Reserved for concepts with insufficient information to code with codable children: Secondary | ICD-10-CM

## 2014-07-18 DIAGNOSIS — I10 Essential (primary) hypertension: Secondary | ICD-10-CM

## 2014-07-18 DIAGNOSIS — E118 Type 2 diabetes mellitus with unspecified complications: Secondary | ICD-10-CM

## 2014-07-18 DIAGNOSIS — E785 Hyperlipidemia, unspecified: Secondary | ICD-10-CM

## 2014-07-18 MED ORDER — SILDENAFIL CITRATE 100 MG PO TABS: 50.0000 mg | ORAL_TABLET | Freq: Every day | ORAL | Status: DC | PRN

## 2014-07-18 NOTE — Progress Notes (Signed)
Pre visit review using our clinic review tool, if applicable. No additional management support is needed unless otherwise documented below in the visit note. 

## 2014-07-18 NOTE — Assessment & Plan Note (Signed)
At target today on current regimen. Last urine MA negative Sept 2015.  

## 2014-07-18 NOTE — Assessment & Plan Note (Signed)
Recent A1c not at target. Sugars are mostly at target on current download.  He has made some dietary changes and I have asked him to continue these.  Try restarting exercise program as prior.    Asked him to check sugars 3 x daily.   Recommended that he titrate up slowly to the full dose of metformin XR to see if he tolerates this form better. He will report back if he doesn't.  Continue Lantus to 34 units daily. If starts to notice many lows, then aware to notify me.  Stop  Novolog for now. Report back if sugars start rising above 180- then will consider restarting Novolog versus increasing Invokana.  Continue current Invokana.   Labs to be scheduled for early Jan.   RTC 2 months.

## 2014-07-18 NOTE — Telephone Encounter (Signed)
Ok to refill,  printed rx  

## 2014-07-18 NOTE — Progress Notes (Signed)
Reason for visit-  Randy Grant is a 48 y.o.-year-old male, here for follow up management of Type 2 diabetes, uncontrolled, with complications ( ? Retinopathy, ED). Seen by me 1 month ago.   HPI- Patient has been diagnosed with diabetes in 1995. Recalls being initially on lifestyle modifications.  Tried  Metformin, Glipizide in the past. he has  been on insulin since 2012. History of non compliance due to financial reasons in the past.   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 Works at [employer] at [employer].  Finding that needs to eat more often in order to prevent a low   Last hemoglobin A1c was: Lab Results  Component Value Date   HGBA1C 10.9* 05/08/2014   HGBA1C 10.1* 07/22/2013   HGBA1C 9.4* 03/27/2013     Pt checks his sugars 1 a day  lately. Uses One Touch mini glucometer. By meter download  they are: Not checking more as work is busy  PREMEAL Breakfast Lunch Dinner Bedtime Overall  Glucose range: 84-90 109-133 103 86-137   Mean/median:        POST-MEAL PC Breakfast PC Lunch PC Dinner  Glucose range:   141-185  Mean/median:       Hypoglycemia-  No significant lows recently as preventing by overeating. ; he has hypoglycemia awareness at 70.   Dietary habits- eats three times daily but eats at times per shift work. Tries to limit carbs, sweetened beverages, sodas, desserts. Eats 1 pita bread per meals. Diet sodas occasionally. Eats cheese sandwich, PB crackers at end of shift as BF ( cut back on CHO intake since last time) Exercise- exercises on Treadmill 3 times weekly -busier this month- not able to do as much Weight - up recently Wt Readings from Last 3 Encounters:  07/18/14 167 lb 7 oz (75.949  kg)  06/13/14 164 lb (74.39 kg)  01/17/14 160 lb 12 oz (72.916 kg)    Diabetes Complications-  Nephropathy- No  CKD, last BUN/creatinine-  Lab Results  Component Value Date   BUN 16 06/27/2014   CREATININE 0.7 06/27/2014   Lab Results  Component Value Date   GFR 128.85 06/27/2014  . Lab Results  Component Value Date   MICRALBCREAT 7.2 05/08/2014     Retinopathy- Yes?, Last DEE was  1 year ago, Laser treatment in the past 1 years at [facility]. Hasn't made an appt Neuropathy- no numbness and tingling in his feet. No known neuropathy.  Associated history - No CAD . No prior stroke. No hypothyroidism. his last TSH was No results found for: TSH  Hyperlipidemia-  his last set of lipids were- Currently on Lipitor 80 mg daily. Tolerating well with mild myalgias on calves Lab Results  Component Value Date   CHOL 194 05/08/2014   HDL 38.80* 05/08/2014   LDLCALC 128* 05/08/2014   LDLDIRECT 141.0 09/11/2013   TRIG 136.0 05/08/2014   CHOLHDL 5 05/08/2014    Blood Pressure/HTN- Patient's blood pressure is well controlled today on current regimen that includes ACE-I ( lisinopril).  I have reviewed the patient's past medical history, medications and allergies.   Current Outpatient Prescriptions on File Prior to Visit  Medication Sig Dispense  Refill  . aspirin 81 MG tablet Take 81 mg by mouth daily.      Marland Kitchen. atorvastatin (LIPITOR) 80 MG tablet TAKE 1 TABLET (80 MG TOTAL) BY MOUTH DAILY. 90 tablet 1  . canagliflozin (INVOKANA) 100 MG TABS tablet Take 1 tablet (100 mg total) by mouth daily. 30 tablet 4  . glucose blood (ONE TOUCH ULTRA TEST) test strip Use as instructed three times daily to check blood sugars 100 each 12  . Insulin Glargine (LANTUS SOLOSTAR) 100 UNIT/ML Solostar Pen INJECT 30 UNITS SUBCUTANEOUSLY DAILY **NEED APPOINTMENT** 15 mL 0  . Insulin Pen Needle (B-D ULTRAFINE III SHORT PEN) 31G X 8 MM MISC Test blood sugars 2 times daily 100 each 5  . lisinopril  (PRINIVIL,ZESTRIL) 10 MG tablet TAKE 1 TABLET BY MOUTH DAILY. 90 tablet 1  . metFORMIN (GLUCOPHAGE XR) 500 MG 24 hr tablet Take 2 tablets (1,000 mg total) by mouth 2 (two) times daily with a meal. 1 tablet by mouth with main meals of the day (Patient taking differently: Take 1,000 mg by mouth 2 (two) times daily with a meal. ) 120 tablet 11  . Multiple Vitamin (MULTIVITAMIN) tablet Take 1 tablet by mouth daily.      . sildenafil (VIAGRA) 100 MG tablet Take 0.5-1 tablets (50-100 mg total) by mouth daily as needed for erectile dysfunction. 5 tablet 11   No current facility-administered medications on file prior to visit.   No Known Allergies   Review of Systems- [ x ]  Complains of    [  ]  denies [  ] Recent weight change [  ]  Fatigue [  ] polydipsia [  ] polyuria [  ]  nocturia [  ]  vision difficulty [  ] chest pain [  ] shortness of breath [  ] leg swelling [  ] cough [  ] nausea/vomiting [  ] diarrhea [  ] constipation [  ] abdominal pain [  ]  tingling/numbness in extremities [  ]  concern with feet ( wounds/sores)   PE: BP 120/70 mmHg  Pulse 102  Temp(Src) 98.4 F (36.9 C) (Oral)  Ht 5' 7.5" (1.715 m)  Wt 167 lb 7 oz (75.949 kg)  BMI 25.82 kg/m2  SpO2 97% Wt Readings from Last 3 Encounters:  07/18/14 167 lb 7 oz (75.949 kg)  06/13/14 164 lb (74.39 kg)  01/17/14 160 lb 12 oz (72.916 kg)   Exam: deferred  ASSESSMENT AND PLAN: Problem List Items Addressed This Visit      Cardiovascular and Mediastinum   HYPERTENSION, BENIGN ESSENTIAL    At target today on current regimen. Last urine MA negative Sept 2015.       Relevant Orders      Comprehensive metabolic panel     Endocrine   Diabetes mellitus type 2 with complications, uncontrolled - Primary    Recent A1c not at target. Sugars are mostly at target on current download.  He has made some dietary changes and I have asked him to continue these.  Try restarting exercise program as prior.    Asked him to check sugars  3 x daily.   Recommended that he titrate up slowly to the full dose of metformin XR to see if he tolerates this form better. He will report back if he doesn't.  Continue Lantus to 34 units daily. If starts to notice many lows, then aware to notify me.  Stop  Novolog for now. Report back if sugars start  rising above 180- then will consider restarting Novolog versus increasing Invokana.  Continue current Invokana.   Labs to be scheduled for early Jan.   RTC 2 months.       Relevant Orders      Comprehensive metabolic panel      Hemoglobin A1c     Other   Hyperlipidemia    Recent labs show uncontrolled LDL. Work on dietary factors, and reassess with next set of labs. Consider switch to Crestor is still uncontrolled. Labs to be scheduled for early Jan.       Relevant Orders      Comprehensive metabolic panel      Lipid panel       - Return to clinic in 2 mo with sugar log/meter. Viagra refill request passed on to Dr Melina Schools nurse.   Kyomi Hector [facility] 07/18/2014 2:16 PM

## 2014-07-18 NOTE — Patient Instructions (Addendum)
Check sugars 3 times daily ( fasting and premeal readings at alternating times, or at bedtime).  Record them in a sugar log and bring log and meter to next appointment.   Try increasing metformin XR to 500 mg in the morning and 1000 mg in the night for the first week, then increase it to 1000 mg twice daily thereafter.   Call me if sugars start rising above 180 over next week, so that Novolog could be reintroduced versus increasing the Invokana.   Continue current Invokana for now.   Stop Novolog. Continue current lantus.   Fasting labs appt for Jan 4th.  Please come back for a follow-up appointment in 2 months.

## 2014-07-18 NOTE — Assessment & Plan Note (Signed)
Recent labs show uncontrolled LDL. Work on dietary factors, and reassess with next set of labs. Consider switch to Crestor is still uncontrolled. Labs to be scheduled for early Jan.

## 2014-07-18 NOTE — Telephone Encounter (Signed)
Patient in office to see Dr.Phadke request refill Viagra ok to fill?

## 2014-07-19 NOTE — Telephone Encounter (Signed)
Faxed to [facility]  

## 2014-07-20 ENCOUNTER — Telehealth: Payer: Self-pay | Admitting: Internal Medicine

## 2014-07-20 NOTE — Telephone Encounter (Signed)
emmi emailed °

## 2014-07-24 ENCOUNTER — Other Ambulatory Visit: Payer: Self-pay | Admitting: Internal Medicine

## 2014-07-25 ENCOUNTER — Other Ambulatory Visit: Payer: Self-pay

## 2014-08-13 ENCOUNTER — Other Ambulatory Visit: Payer: 59

## 2014-08-21 ENCOUNTER — Other Ambulatory Visit: Payer: 59

## 2014-09-18 ENCOUNTER — Ambulatory Visit (INDEPENDENT_AMBULATORY_CARE_PROVIDER_SITE_OTHER): Payer: 59 | Admitting: Internal Medicine

## 2014-09-18 ENCOUNTER — Encounter: Payer: Self-pay | Admitting: Internal Medicine

## 2014-09-18 VITALS — BP 110/72 | HR 103 | Temp 97.4°F | Resp 16 | Ht 66.75 in | Wt 160.2 lb

## 2014-09-18 DIAGNOSIS — Z125 Encounter for screening for malignant neoplasm of prostate: Secondary | ICD-10-CM

## 2014-09-18 DIAGNOSIS — E118 Type 2 diabetes mellitus with unspecified complications: Secondary | ICD-10-CM | POA: Diagnosis not present

## 2014-09-18 DIAGNOSIS — Z0000 Encounter for general adult medical examination without abnormal findings: Secondary | ICD-10-CM

## 2014-09-18 DIAGNOSIS — E785 Hyperlipidemia, unspecified: Secondary | ICD-10-CM | POA: Diagnosis not present

## 2014-09-18 DIAGNOSIS — Z9119 Patient's noncompliance with other medical treatment and regimen: Secondary | ICD-10-CM

## 2014-09-18 DIAGNOSIS — K2990 Gastroduodenitis, unspecified, without bleeding: Secondary | ICD-10-CM

## 2014-09-18 DIAGNOSIS — I10 Essential (primary) hypertension: Secondary | ICD-10-CM | POA: Diagnosis not present

## 2014-09-18 DIAGNOSIS — Z1211 Encounter for screening for malignant neoplasm of colon: Secondary | ICD-10-CM

## 2014-09-18 DIAGNOSIS — E1165 Type 2 diabetes mellitus with hyperglycemia: Secondary | ICD-10-CM

## 2014-09-18 DIAGNOSIS — Z91199 Patient's noncompliance with other medical treatment and regimen due to unspecified reason: Secondary | ICD-10-CM

## 2014-09-18 DIAGNOSIS — K2970 Gastritis, unspecified, without bleeding: Secondary | ICD-10-CM

## 2014-09-18 DIAGNOSIS — IMO0002 Reserved for concepts with insufficient information to code with codable children: Secondary | ICD-10-CM

## 2014-09-18 LAB — COMPREHENSIVE METABOLIC PANEL
ALT: 24 U/L (ref 0–53)
AST: 15 U/L (ref 0–37)
Albumin: 4.3 g/dL (ref 3.5–5.2)
Alkaline Phosphatase: 75 U/L (ref 39–117)
BILIRUBIN TOTAL: 0.4 mg/dL (ref 0.2–1.2)
BUN: 13 mg/dL (ref 6–23)
CO2: 28 mEq/L (ref 19–32)
Calcium: 9.3 mg/dL (ref 8.4–10.5)
Chloride: 102 mEq/L (ref 96–112)
Creatinine, Ser: 0.69 mg/dL (ref 0.40–1.50)
GFR: 128.73 mL/min (ref >60.00)
Glucose, Bld: 221 mg/dL — ABNORMAL HIGH (ref 70–99)
POTASSIUM: 4.9 meq/L (ref 3.5–5.1)
Sodium: 136 mEq/L (ref 135–145)
Total Protein: 6.9 g/dL (ref 6.0–8.3)

## 2014-09-18 LAB — LIPID PANEL
CHOL/HDL RATIO: 5
Cholesterol: 186 mg/dL (ref 0–200)
HDL: 40.1 mg/dL (ref >39.00)
NonHDL: 145.9
TRIGLYCERIDES: 209 mg/dL — AB (ref 0.0–149.0)
VLDL: 41.8 mg/dL — AB (ref 0.0–40.0)

## 2014-09-18 LAB — HEMOGLOBIN A1C: Hgb A1c MFr Bld: 8.2 % — ABNORMAL HIGH (ref 4.6–6.5)

## 2014-09-18 LAB — LDL CHOLESTEROL, DIRECT: Direct LDL: 112 mg/dL

## 2014-09-18 LAB — PSA: PSA: 0.62 ng/mL (ref 0.10–4.00)

## 2014-09-18 NOTE — Progress Notes (Signed)
Patient ID: Randy Grant, male   DOB: Feb 28, 1966, 49 y.o.   MRN: [id number]  The patient is here for his annual male physical examination and management of other chronic and acute problems, including follow up on hypertension and  Hyperlipidemia.  His  DM is now managed by Endocrinologist Dr Welford Roche and he is due for follow up labs.    He has had a recent prolonged episode of gastritis which was causing recurrent.  His symptoms improved after eliminating up spicy food, about  2 months ago.  He also started taking OTC Nexium 20 mg  But only began taking it 3 days ago,in the morning.  He also takes one baby aspirin daily but no regular use of NSAIDs > He has had weight loss , unintentional,, of  7 lbs, but attributes the weight loss to initiation of Invokanna.  He denies any change in BMs .     The risk factors are reflected in the social history.  The roster of all physicians providing medical care to patient - is listed in the Snapshot section of the chart.  Activities of daily living:  The patient is 100% independent in all ADLs: dressing, toileting, feeding as well as independent mobility  Home safety : The patient has smoke detectors in the home. He wears seatbelts.  There are no firearms at home. There is no violence in the home.   There is no risks for hepatitis, STDs or HIV. There is no   history of blood transfusion. There is no travel history to infectious disease endemic areas of the world.  The patient has seen their dentist in the last six month and  their eye doctor in the last year.  They do not  have excessive sun exposure. They have seen a dermatoloigist in the last year. Discussed the need for sun protection: hats, long sleeves and use of sunscreen if there is significant sun exposure.   Diet: the importance of a healthy diet is discussed. They do have a healthy diet.  The benefits of regular aerobic exercise were discussed. He exercises a minimum of 30 minutes  5 days per  week. Depression screen: there are no signs or vegative symptoms of depression- irritability, change in appetite, anhedonia, sadness/tearfullness.  The following portions of the patient's history were reviewed and updated as appropriate: allergies, current medications, past family history, past medical history,  past surgical history, past social history  and problem list.  Visual acuity was not assessed per patient preference since he has regular follow up with his ophthalmologist. Hearing and body mass index were assessed and reviewed.   During the course of the visit the patient was educated and counseled about appropriate screening and preventive services including :  nutrition counseling, colorectal cancer screening, and recommended immunizations.    Review of Systems:  Patient denies headache, fevers, malaise, unintentional weight loss, skin rash, eye pain, sinus congestion and sinus pain, sore throat, dysphagia,  hemoptysis , cough, dyspnea, wheezing, chest pain, palpitations, orthopnea, edema, abdominal pain, nausea, melena, diarrhea, constipation, flank pain, dysuria, hematuria,  nocturia, numbness, tingling, seizures,  Focal weakness, Loss of consciousness,  Tremor, insomnia, depression, anxiety, and suicidal ideation.    Objective:  BP 110/72 mmHg  Pulse 103  Temp(Src) 97.4 F (36.3 C) (Oral)  Resp 16  Ht 5' 6.75" (1.695 m)  Wt 160 lb 4 oz (72.689 kg)  BMI 25.30 kg/m2  SpO2 96%  General Appearance:    Alert, cooperative, no distress, appears stated  age  Head:    Normocephalic, without obvious abnormality, atraumatic  Eyes:    PERRL, conjunctiva/corneas clear, EOM's intact, fundi    benign, both eyes       Ears:    Normal TM's and external ear canals, both ears  Nose:   Nares normal, septum midline, mucosa normal, no drainage   or sinus tenderness  Throat:   Lips, mucosa, and tongue normal; teeth and gums normal  Neck:   Supple, symmetrical, trachea midline, no adenopathy;        thyroid:  No enlargement/tenderness/nodules; no carotid   bruit or JVD  Back:     Symmetric, no curvature, ROM normal, no CVA tenderness  Lungs:     Clear to auscultation bilaterally, respirations unlabored  Chest wall:    No tenderness or deformity  Heart:    Regular rate and rhythm, S1 and S2 normal, no murmur, rub   or gallop  Abdomen:     Soft, non-tender, bowel sounds active all four quadrants,    no masses, no organomegaly  Genitalia:    Normal male without lesion, discharge or tenderness  Rectal:    Normal tone, normal prostate, no masses or tenderness;   guaiac negative stool  Extremities:   Extremities normal, atraumatic, no cyanosis or edema  Pulses:   2+ and symmetric all extremities  Skin:   Skin color, texture, turgor normal, no rashes or lesions  Lymph nodes:   Cervical, supraclavicular, and axillary nodes normal  Neurologic:   CNII-XII intact. Normal strength, sensation and reflexes      throughout   Assessment and Plan:  Problem List Items Addressed This Visit    Screening for prostate cancer - Primary    PSA was normal today.  Lab Results  Component Value Date   PSA 0.62 09/18/2014   PSA 0.56 12/08/2012         Relevant Orders   PSA (Completed)   Noncompliance with diabetes treatment    He has been referred to Endocrinology for management of uncontrolled DM.       HYPERTENSION, BENIGN ESSENTIAL    Well controlled on current regimen. Renal function stable, no changes today.  Lab Results  Component Value Date   CREATININE 0.69 09/18/2014   Lab Results  Component Value Date   NA 136 09/18/2014   K 4.9 09/18/2014   CL 102 09/18/2014   CO2 28 09/18/2014   Lab Results  Component Value Date   MICROALBUR 12.5* 05/08/2014         Hyperlipidemia    LDL has improved from 141 to 112 on maximal dose atorvastatin , which is still not at goal,  [facility] if he confirms compliance  Lab Results  Component Value Date   CHOL  186 09/18/2014   HDL 40.10 09/18/2014   LDLCALC 128* 05/08/2014   LDLDIRECT 112.0 09/18/2014   TRIG 209.0* 09/18/2014   CHOLHDL 5 09/18/2014        Gastritis and gastroduodenitis    New onset, symptoms started 2 months ago and have improved with elimination of spicy foods somewhat .  Will reassess after 4 weeks of OTC Nexium.  Will need to consider alternative etiologies including Invoke vs H pylori if symptoms persist       Encounter for preventive health examination    Annual wellness  exam was done as well as a comprehensive physical exam and management of acute and chronic conditions .  During the course of the  visit the patient was educated and counseled about appropriate screening and preventive services including :  diabetes screening, lipid analysis with projected  10 year  risk for CAD , nutrition counseling, colorectal cancer screening, and recommended immunizations.  Printed recommendations for health maintenance screenings was given.       Diabetes mellitus type 2 with complications, uncontrolled   Relevant Orders   Ambulatory referral to Ophthalmology    Other Visit Diagnoses    Colon cancer screening        Relevant Orders    Ambulatory referral to Gastroenterology

## 2014-09-18 NOTE — Progress Notes (Signed)
Pre-visit discussion using our clinic review tool. No additional management support is needed unless otherwise documented below in the visit note.  

## 2014-09-18 NOTE — Patient Instructions (Signed)
Referral for colonoscopy and for eye exam ARE IN PROCESS  RETURN TO DR PHADKE FOR MANAGEMENT OF DIABETES   Health Maintenance A healthy lifestyle and preventative care can promote health and wellness.  Maintain regular health, dental, and eye exams.  Eat a healthy diet. Foods like vegetables, fruits, whole grains, low-fat dairy products, and lean protein foods contain the nutrients you need and are low in calories. Decrease your intake of foods high in solid fats, added sugars, and salt. Get information about a proper diet from your health care provider, if necessary.  Regular physical exercise is one of the most important things you can do for your health. Most adults should get at least 150 minutes of moderate-intensity exercise (any activity that increases your heart rate and causes you to sweat) each week. In addition, most adults need muscle-strengthening exercises on 2 or more days a week.   Maintain a healthy weight. The body mass index (BMI) is a screening tool to identify possible weight problems. It provides an estimate of body fat based on height and weight. Your health care provider can find your BMI and can help you achieve or maintain a healthy weight. For males 20 years and older:  A BMI below 18.5 is considered underweight.  A BMI of 18.5 to 24.9 is normal.  A BMI of 25 to 29.9 is considered overweight.  A BMI of 30 and above is considered obese.  Maintain normal blood lipids and cholesterol by exercising and minimizing your intake of saturated fat. Eat a balanced diet with plenty of fruits and vegetables. Blood tests for lipids and cholesterol should begin at age 51 and be repeated every 5 years. If your lipid or cholesterol levels are high, you are over age 850, or you are at high risk for heart disease, you may need your cholesterol levels checked more frequently.Ongoing high lipid and cholesterol levels should be treated with medicines if diet and exercise are not  working.  If you smoke, find out from your health care provider how to quit. If you do not use tobacco, do not start.  Lung cancer screening is recommended for adults aged 55-80 years who are at high risk for developing lung cancer because of a history of smoking. A yearly low-dose CT scan of the lungs is recommended for people who have at least a 30-pack-year history of smoking and are current smokers or have quit within the past 15 years. A pack year of smoking is smoking an average of 1 pack of cigarettes a day for 1 year (for example, a 30-pack-year history of smoking could mean smoking 1 pack a day for 30 years or 2 packs a day for 15 years). Yearly screening should continue until the smoker has stopped smoking for at least 15 years. Yearly screening should be stopped for people who develop a health problem that would prevent them from having lung cancer treatment.  If you choose to drink alcohol, do not have more than 2 drinks per day. One drink is considered to be 12 oz (360 mL) of beer, 5 oz (150 mL) of wine, or 1.5 oz (45 mL) of liquor.  Avoid the use of street drugs. Do not share needles with anyone. Ask for help if you need support or instructions about stopping the use of drugs.  High blood pressure causes heart disease and increases the risk of stroke. Blood pressure should be checked at least every 1-2 years. Ongoing high blood pressure should be treated with medicines  if weight loss and exercise are not effective.  If you are 1-58 years old, ask your health care provider if you should take aspirin to prevent heart disease.  Diabetes screening involves taking a blood sample to check your fasting blood sugar level. This should be done once every 3 years after age 33 if you are at a normal weight and without risk factors for diabetes. Testing should be considered at a younger age or be carried out more frequently if you are overweight and have at least 1 risk factor for  diabetes.  Colorectal cancer can be detected and often prevented. Most routine colorectal cancer screening begins at the age of 61 and continues through age 89. However, your health care provider may recommend screening at an earlier age if you have risk factors for colon cancer. On a yearly basis, your health care provider may provide home test kits to check for hidden blood in the stool. A small camera at the end of a tube may be used to directly examine the colon (sigmoidoscopy or colonoscopy) to detect the earliest forms of colorectal cancer. Talk to your health care provider about this at age 79 when routine screening begins. A direct exam of the colon should be repeated every 5-10 years through age 33, unless early forms of precancerous polyps or small growths are found.  People who are at an increased risk for hepatitis B should be screened for this virus. You are considered at high risk for hepatitis B if:  You were born in a country where hepatitis B occurs often. Talk with your health care provider about which countries are considered high risk.  Your parents were born in a high-risk country and you have not received a shot to protect against hepatitis B (hepatitis B vaccine).  You have HIV or AIDS.  You use needles to inject street drugs.  You live with, or have sex with, someone who has hepatitis B.  You are a man who has sex with other men (MSM).  You get hemodialysis treatment.  You take certain medicines for conditions like cancer, organ transplantation, and autoimmune conditions.  Hepatitis C blood testing is recommended for all people born from [birth year] through 1967 and any individual with known risk factors for hepatitis C.  Healthy men should no longer receive prostate-specific antigen (PSA) blood tests as part of routine cancer screening. Talk to your health care provider about prostate cancer screening.  Testicular cancer screening is not recommended for adolescents or  adult males who have no symptoms. Screening includes self-exam, a health care provider exam, and other screening tests. Consult with your health care provider about any symptoms you have or any concerns you have about testicular cancer.  Practice safe sex. Use condoms and avoid high-risk sexual practices to reduce the spread of sexually transmitted infections (STIs).  You should be screened for STIs, including gonorrhea and chlamydia if:  You are sexually active and are younger than 24 years.  You are older than 24 years, and your health care provider tells you that you are at risk for this type of infection.  Your sexual activity has changed since you were last screened, and you are at an increased risk for chlamydia or gonorrhea. Ask your health care provider if you are at risk.  If you are at risk of being infected with HIV, it is recommended that you take a prescription medicine daily to prevent HIV infection. This is called pre-exposure prophylaxis (PrEP). You are considered at  risk if:  You are a man who has sex with other men (MSM).  You are a heterosexual man who is sexually active with multiple partners.  You take drugs by injection.  You are sexually active with a partner who has HIV.  Talk with your health care provider about whether you are at high risk of being infected with HIV. If you choose to begin PrEP, you should first be tested for HIV. You should then be tested every 3 months for as long as you are taking PrEP.  Use sunscreen. Apply sunscreen liberally and repeatedly throughout the day. You should seek shade when your shadow is shorter than you. Protect yourself by wearing long sleeves, pants, a wide-brimmed hat, and sunglasses year round whenever you are outdoors.  Tell your health care provider of new moles or changes in moles, especially if there is a change in shape or color. Also, tell your health care provider if a mole is larger than the size of a pencil  eraser.  A one-time screening for abdominal aortic aneurysm (AAA) and surgical repair of large AAAs by ultrasound is recommended for men aged 44-75 years who are current or former smokers.  Stay current with your vaccines (immunizations). Document Released: 01/23/2008 Document Revised: 08/01/2013 Document Reviewed: 12/22/2010 [facility] Patient Information 2015 [employer], [place]. This information is not intended to replace advice given to you by your health care provider. Make sure you discuss any questions you have with your health care provider.

## 2014-09-20 ENCOUNTER — Telehealth: Payer: Self-pay | Admitting: Internal Medicine

## 2014-09-20 DIAGNOSIS — K2970 Gastritis, unspecified, without bleeding: Secondary | ICD-10-CM | POA: Insufficient documentation

## 2014-09-20 DIAGNOSIS — Z0000 Encounter for general adult medical examination without abnormal findings: Secondary | ICD-10-CM | POA: Insufficient documentation

## 2014-09-20 DIAGNOSIS — K2990 Gastroduodenitis, unspecified, without bleeding: Secondary | ICD-10-CM

## 2014-09-20 NOTE — Assessment & Plan Note (Signed)
Well controlled on current regimen. Renal function stable, no changes today.  Lab Results  Component Value Date   CREATININE 0.69 09/18/2014   Lab Results  Component Value Date   NA 136 09/18/2014   K 4.9 09/18/2014   CL 102 09/18/2014   CO2 28 09/18/2014   Lab Results  Component Value Date   MICROALBUR 12.5* 05/08/2014

## 2014-09-20 NOTE — Telephone Encounter (Signed)
PSA is normal (prostate cancer screening test), Please confirm whether he is taking the atorvastatin bc his cholelsterol is not controlled

## 2014-09-20 NOTE — Assessment & Plan Note (Addendum)
New onset, symptoms started 2 months ago and have improved with elimination of spicy foods somewhat .  Will reassess after 4 weeks of OTC Nexium.  Will need to consider alternative etiologies including Invoke vs H pylori if symptoms persist

## 2014-09-20 NOTE — Assessment & Plan Note (Signed)

## 2014-09-20 NOTE — Assessment & Plan Note (Signed)
LDL has improved from 141 to 112 on maximal dose atorvastatin , which is still not at goal,  [facility] if he confirms compliance  Lab Results  Component Value Date   CHOL 186 09/18/2014   HDL 40.10 09/18/2014   LDLCALC 128* 05/08/2014   LDLDIRECT 112.0 09/18/2014   TRIG 209.0* 09/18/2014   CHOLHDL 5 09/18/2014

## 2014-09-20 NOTE — Telephone Encounter (Signed)
Left message for patient to call office.  

## 2014-09-20 NOTE — Assessment & Plan Note (Signed)
He has been referred to Endocrinology for management of uncontrolled DM.

## 2014-09-20 NOTE — Assessment & Plan Note (Signed)
PSA was normal today.  Lab Results  Component Value Date   PSA 0.62 09/18/2014   PSA 0.56 12/08/2012

## 2014-09-21 NOTE — Telephone Encounter (Signed)
Left message for patient to return call to office. 

## 2014-09-24 ENCOUNTER — Telehealth: Payer: Self-pay | Admitting: Internal Medicine

## 2014-09-24 NOTE — Telephone Encounter (Signed)
emmi emailed °

## 2014-09-25 ENCOUNTER — Encounter: Payer: Self-pay | Admitting: *Deleted

## 2014-09-25 NOTE — Telephone Encounter (Signed)
Mailed letter to patient and requested call back as to confirm atorvastatin for cholesterol.

## 2014-09-26 ENCOUNTER — Other Ambulatory Visit: Payer: Self-pay | Admitting: Internal Medicine

## 2014-09-26 ENCOUNTER — Telehealth: Payer: Self-pay

## 2014-09-26 NOTE — Telephone Encounter (Signed)
Called pharmacy to reduce the amount of refills authorized on metformin Rx per dr. Ephriam Jenkinsphadke's request. Talk to Weston BrassNick at [facility]. Only allow 5 refills instead of 11. Weston Brassick verbalized understanding.

## 2014-09-27 NOTE — Telephone Encounter (Signed)
Refill changed to 5 instead of 11 as requested by Dr. Welford RochePhadke. Spoke to [place] at [facility].

## 2014-09-27 NOTE — Telephone Encounter (Signed)
Received this request from [facility] - pt is seeing you.

## 2014-09-28 NOTE — Telephone Encounter (Signed)
Refill was sent to [facility] already. Scarlette Sliceonika was going to talk to the pt to see whether he needs additional refill to be [facility] as well.

## 2014-10-01 ENCOUNTER — Telehealth: Payer: Self-pay | Admitting: Internal Medicine

## 2014-10-01 DIAGNOSIS — E785 Hyperlipidemia, unspecified: Secondary | ICD-10-CM

## 2014-10-01 NOTE — Assessment & Plan Note (Signed)
LDL has improved from 141 to 112 on maximal dose atorvastatin ,  Lab Results  Component Value Date   CHOL 186 09/18/2014   HDL 40.10 09/18/2014   LDLCALC 128* 05/08/2014   LDLDIRECT 112.0 09/18/2014   TRIG 209.0* 09/18/2014   CHOLHDL 5 09/18/2014

## 2014-10-01 NOTE — Telephone Encounter (Signed)
Ok,  Continue atorvastatin.

## 2014-10-01 NOTE — Telephone Encounter (Signed)
Patient called office after receiving letter and verified he is taking the Atorvastatin every day Please advise.

## 2014-10-01 NOTE — Telephone Encounter (Signed)
Spoke to patient about metformin Rx per Dr. Ephriam JenkinsPhadke's request. Patient does not use mail order pharmacy. He would like all Rx's to be sent to [facility].

## 2014-10-02 NOTE — Telephone Encounter (Signed)
Patient notified

## 2014-10-04 ENCOUNTER — Encounter: Payer: Self-pay | Admitting: Endocrinology

## 2014-10-04 ENCOUNTER — Ambulatory Visit (INDEPENDENT_AMBULATORY_CARE_PROVIDER_SITE_OTHER): Payer: 59 | Admitting: Endocrinology

## 2014-10-04 VITALS — BP 116/70 | HR 93 | Resp 12 | Ht 66.75 in | Wt 159.0 lb

## 2014-10-04 DIAGNOSIS — E1165 Type 2 diabetes mellitus with hyperglycemia: Secondary | ICD-10-CM

## 2014-10-04 DIAGNOSIS — I10 Essential (primary) hypertension: Secondary | ICD-10-CM

## 2014-10-04 DIAGNOSIS — E118 Type 2 diabetes mellitus with unspecified complications: Secondary | ICD-10-CM

## 2014-10-04 DIAGNOSIS — E785 Hyperlipidemia, unspecified: Secondary | ICD-10-CM

## 2014-10-04 DIAGNOSIS — IMO0002 Reserved for concepts with insufficient information to code with codable children: Secondary | ICD-10-CM

## 2014-10-04 MED ORDER — CANAGLIFLOZIN 300 MG PO TABS: 300.0000 mg | ORAL_TABLET | Freq: Every day | ORAL | Status: DC

## 2014-10-04 NOTE — Patient Instructions (Addendum)
Check sugars 2 x daily ( before breakfast and before supper).  Record them in a log book and bring that/meter to next appointment.    Continue current doses of metformin, lantus  Change Invokana to 300 mg daily. Return in 2 weeks for nonfasting labs.   Report back if problems with persistent high or low sugars.  As you start to get low sugars, I will plan to decrease dose of lantus insulin. Please let me know if sugars start going below 80.   Please come back for a follow-up appointment in 3 months

## 2014-10-04 NOTE — Progress Notes (Signed)
Pre visit review using our clinic review tool, if applicable. No additional management support is needed unless otherwise documented below in the visit note. 

## 2014-10-04 NOTE — Assessment & Plan Note (Signed)
At target today on current regimen. Last urine MA negative Sept 2015.  

## 2014-10-04 NOTE — Assessment & Plan Note (Signed)
Recent A1c not at target, but significant improvement.  He has made some dietary changes and I have asked him to continue these. Try increasing exercise to 2 x weekly.  Asked him to check sugars 2 x daily.   Continue current metformin and lantus.  Increase Invokana to 300 mg daily. If starts to notice many lows, then aware to notify me- so that Lantus could be weaned down.  Return in 2 weeks for nonfasting labs.   RTC 3 months.

## 2014-10-04 NOTE — Progress Notes (Signed)
Reason for visit-  Randy Grant is a 49 y.o.-year-old male, here for follow up management of Type 2 diabetes, uncontrolled, with complications ( ? Retinopathy, ED). Seen by me 2 month ago.   HPI- Patient has been diagnosed with diabetes in 1995. Recalls being initially on lifestyle modifications.  Tried  Metformin, Glipizide in the past. he has  been on insulin since 2012. History of non compliance due to financial reasons in the past.   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 Works at [employer] at [employer].     Last hemoglobin A1c was: Lab Results  Component Value Date   HGBA1C 8.2* 09/18/2014   HGBA1C 10.9* 05/08/2014   HGBA1C 10.1* 07/22/2013     Pt checks his sugars 1 a day  lately. Uses One Touch mini glucometer. By recall  they are:   PREMEAL Breakfast Lunch Dinner Bedtime Overall  Glucose range:     90-150  Mean/median:        POST-MEAL PC Breakfast PC Lunch PC Dinner  Glucose range:     Mean/median:       Hypoglycemia-  No significant lows recently ; he has hypoglycemia awareness at 70.   Dietary habits- eats three times daily but eats at times per shift work. Tries to limit carbs, sweetened beverages, sodas, desserts. Eats 1 pita bread per meals. Diet sodas occasionally. Not eating much overall.  Exercise- exercises on Treadmill 1 times weekly  Weight - down recently Wt Readings from Last 3 Encounters:  10/04/14 159 lb (72.122 kg)  09/18/14 160 lb 4 oz (72.689 kg)  07/18/14 167 lb 7 oz (75.949 kg)    Diabetes Complications-  Nephropathy- No  CKD, last BUN/creatinine-  Lab Results  Component Value Date   BUN 13 09/18/2014   CREATININE 0.69 09/18/2014   Lab Results  Component Value Date   GFR  128.73 09/18/2014  . Lab Results  Component Value Date   MICRALBCREAT 7.2 05/08/2014     Retinopathy- Yes?, Last DEE was  1 year ago, [facility] the [facility]. Has made an appt for march Neuropathy- no numbness and tingling in his feet. No known neuropathy.  Associated history - No CAD . No prior stroke. No hypothyroidism. his last TSH was No results found for: TSH  Hyperlipidemia-  his last set of lipids were- Currently on Lipitor 80 mg daily. Tolerating well with mild myalgias on calves. Reports that recent lipids were done nonfasting.  Lab Results  Component Value Date   CHOL 186 09/18/2014   HDL 40.10 09/18/2014   LDLCALC 128* 05/08/2014   LDLDIRECT 112.0 09/18/2014   TRIG 209.0* 09/18/2014   CHOLHDL 5 09/18/2014    Blood Pressure/HTN- Patient's blood pressure is well controlled today on current regimen that includes ACE-I ( lisinopril).  I have reviewed the patient's past medical history, medications and allergies.   Current Outpatient Prescriptions on File Prior to Visit  Medication Sig Dispense Refill  . aspirin 81 MG tablet Take 81 mg by mouth daily.      Marland Kitchen. atorvastatin (LIPITOR) 80 MG tablet TAKE 1 TABLET (80 MG TOTAL) BY MOUTH DAILY. 90 tablet 1  .  glucose blood (ONE TOUCH ULTRA TEST) test strip Use as instructed three times daily to check blood sugars 100 each 12  . Insulin Pen Needle (B-D ULTRAFINE III SHORT PEN) 31G X 8 MM MISC Test blood sugars 2 times daily 100 each 5  . LANTUS SOLOSTAR 100 UNIT/ML Solostar Pen INJECT 30 UNITS SUBCUTANEOUSLY DAILY **NEED APPOINTMENT** 15 mL 5  . lisinopril (PRINIVIL,ZESTRIL) 10 MG tablet TAKE 1 TABLET BY MOUTH DAILY. 90 tablet 1  . metFORMIN (GLUCOPHAGE-XR) 500 MG 24 hr tablet TAKE 2 TABLETS BY MOUTH TWO TIMES A DAY WITH A MEALS 120 tablet 11  . Multiple Vitamin (MULTIVITAMIN) tablet Take 1 tablet by mouth daily.      . sildenafil (VIAGRA) 100 MG tablet Take 0.5-1 tablets (50-100 mg total) by mouth  daily as needed for erectile dysfunction. 5 tablet 11   No current facility-administered medications on file prior to visit.   No Known Allergies   Review of Systems- [ x ]  Complains of    [  ]  denies [  ] Recent weight change [  ]  Fatigue [  ] polydipsia [  ] polyuria [  ]  nocturia [  ]  vision difficulty [  ] chest pain [  ] shortness of breath [  ] leg swelling [  ] cough [  ] nausea/vomiting [  ] diarrhea [  ] constipation [  ] abdominal pain [  ]  tingling/numbness in extremities [  ]  concern with feet ( wounds/sores)   PE: BP 116/70 mmHg  Pulse 93  Resp 12  Ht 5' 6.75" (1.695 m)  Wt 159 lb (72.122 kg)  BMI 25.10 kg/m2  SpO2 98% Wt Readings from Last 3 Encounters:  10/04/14 159 lb (72.122 kg)  09/18/14 160 lb 4 oz (72.689 kg)  07/18/14 167 lb 7 oz (75.949 kg)   Exam: deferred  ASSESSMENT AND PLAN: Problem List Items Addressed This Visit      Cardiovascular and Mediastinum   HYPERTENSION, BENIGN ESSENTIAL    At target today on current regimen. Last urine MA negative Sept 2015.             Endocrine   Diabetes mellitus type 2 with complications, uncontrolled - Primary    Recent A1c not at target, but significant improvement.  He has made some dietary changes and I have asked him to continue these. Try increasing exercise to 2 x weekly.  Asked him to check sugars 2 x daily.   Continue current metformin and lantus.  Increase Invokana to 300 mg daily. If starts to notice many lows, then aware to notify me- so that Lantus could be weaned down.  Return in 2 weeks for nonfasting labs.   RTC 3 months.           Relevant Medications   canagliflozin (INVOKANA) 300 MG TABS tablet   Other Relevant Orders   Basic metabolic panel     Other   Hyperlipidemia    Recent labs show uncontrolled LDL- non fasting sample. Work on dietary factors, and reassess with next set of labs. Consider switch to Crestor is still uncontrolled. Labs to be done 3 months               - Return to clinic in 3 mo with sugar log/meter.  Darwin Rothlisberger [facility] 10/04/2014 1:56 PM

## 2014-10-04 NOTE — Assessment & Plan Note (Signed)
Recent labs show uncontrolled LDL- non fasting sample. Work on dietary factors, and reassess with next set of labs. Consider switch to Crestor is still uncontrolled. Labs to be done 3 months

## 2014-10-16 ENCOUNTER — Other Ambulatory Visit: Payer: 59

## 2014-10-22 ENCOUNTER — Encounter: Payer: Self-pay | Admitting: *Deleted

## 2014-10-22 LAB — HM DIABETES EYE EXAM

## 2014-10-24 ENCOUNTER — Encounter: Payer: Self-pay | Admitting: Internal Medicine

## 2014-11-01 ENCOUNTER — Other Ambulatory Visit (INDEPENDENT_AMBULATORY_CARE_PROVIDER_SITE_OTHER): Payer: 59

## 2014-11-01 DIAGNOSIS — E118 Type 2 diabetes mellitus with unspecified complications: Secondary | ICD-10-CM

## 2014-11-01 DIAGNOSIS — E1165 Type 2 diabetes mellitus with hyperglycemia: Secondary | ICD-10-CM

## 2014-11-01 DIAGNOSIS — IMO0002 Reserved for concepts with insufficient information to code with codable children: Secondary | ICD-10-CM

## 2014-11-01 LAB — BASIC METABOLIC PANEL
BUN: 20 mg/dL (ref 6–23)
CHLORIDE: 99 meq/L (ref 96–112)
CO2: 28 mEq/L (ref 19–32)
Calcium: 9.2 mg/dL (ref 8.4–10.5)
Creatinine, Ser: 0.83 mg/dL (ref 0.40–1.50)
GFR: 103.96 mL/min (ref >60.00)
GLUCOSE: 197 mg/dL — AB (ref 70–99)
POTASSIUM: 4.4 meq/L (ref 3.5–5.1)
Sodium: 133 mEq/L — ABNORMAL LOW (ref 135–145)

## 2014-11-21 ENCOUNTER — Ambulatory Visit: Payer: 59

## 2014-12-11 ENCOUNTER — Other Ambulatory Visit: Payer: Self-pay | Admitting: Internal Medicine

## 2014-12-24 ENCOUNTER — Other Ambulatory Visit: Payer: Self-pay

## 2014-12-24 NOTE — Patient Outreach (Signed)
[facility] Care Management  12/24/2014  Randy Grant 08/05/1966 [id number]  Spoke to patient by phone- [place] was [place] during our last scheduled visit so we are now rescheduling that appointment.  He is routinely seeing Dr. Welford RochePhadke; he saw her in March and will see her again in June, 2016.  I will see him on July 11 @ 1pm.  Patient is currently taking Invokana 300mg /day, Metformin 1000mg  2x/day and Lantus 34 units at night (3am).  He denies any low blood sugars. He tells me his blood sugars are 79-103mg /dl.   I have asked Takai to discuss with Dr. Welford RochePhadke, how he will take his medications during Ramadan.  He will discuss it in June when he sees her because it will be before Ramadan starts.  Susette RacerJulie Issa Kosmicki, RN, BA, MHA, CDE [facility] Diabetes Coordinator- [facility] To [employer] Direct Dial:  [phone]  Fax:  [phone] E-mail: [email or web address] [address], [place], [place]  [id number]

## 2015-02-18 ENCOUNTER — Other Ambulatory Visit: Payer: Self-pay

## 2015-02-18 ENCOUNTER — Ambulatory Visit: Payer: 59

## 2015-02-18 NOTE — Patient Outreach (Signed)
[facility]) Care Management  02/18/2015  Mancel Parsonslamin E Borthwick 08/18/1965 [id number]   Naresh cancelled today's Link to Wellness, employee diabetes visit.  He did not see a physician before Ramadan and did not fast this year.  Panfilo has rescheduled with me for 03/18/15.  He states he is taking his Lantus, Invokana and Metformin and his blood sugars are 70-130mg /dl- he has not had any hypoglycemia events.     Susette RacerJulie Sanjiv Castorena, RN, BA, MHA, CDE [facility] Diabetes Coordinator- Link To [employer] Direct Dial:  [phone]  Fax:  [phone] E-mail: [email or web address] [address], [place], [place]  [id number]

## 2015-03-11 ENCOUNTER — Other Ambulatory Visit: Payer: Self-pay | Admitting: Internal Medicine

## 2015-03-11 ENCOUNTER — Other Ambulatory Visit: Payer: Self-pay | Admitting: Endocrinology

## 2015-03-18 ENCOUNTER — Ambulatory Visit: Payer: 59

## 2015-03-18 ENCOUNTER — Other Ambulatory Visit: Payer: Self-pay

## 2015-03-18 NOTE — Patient Outreach (Signed)
[facility]) Care Management  03/18/2015  Randy Grant [birth year] [id number]   Reg did not show for today's appointment. I have left a message to call and reschedule.   [name], RN, BA, MHA, CDE [facility] Diabetes Coordinator- Link To [employer] Dial:  [phone]  Fax:  [phone] E-mail: [name] [address], [place], [place]  [address]

## 2015-03-25 ENCOUNTER — Other Ambulatory Visit: Payer: Self-pay

## 2015-03-25 NOTE — Patient Outreach (Signed)
[facility]) Care Management  03/25/2015  Randy Grant 1966-04-02 [id number]  Sent e-mail to patient to remind him of his upcoming appointment- he left a message requesting a Monday visit.   [name], RN, BA, MHA, CDE [facility] Diabetes Coordinator- Link To [employer] Dial:  [phone]  Fax:  [phone] E-mail: [name] [address] [place], [place]  [address]

## 2015-04-03 ENCOUNTER — Ambulatory Visit (INDEPENDENT_AMBULATORY_CARE_PROVIDER_SITE_OTHER): Payer: 59 | Admitting: Internal Medicine

## 2015-04-03 ENCOUNTER — Encounter: Payer: Self-pay | Admitting: Internal Medicine

## 2015-04-03 ENCOUNTER — Encounter (INDEPENDENT_AMBULATORY_CARE_PROVIDER_SITE_OTHER): Payer: Self-pay

## 2015-04-03 VITALS — BP 110/68 | HR 92 | Temp 98.3°F | Resp 12 | Ht 67.0 in | Wt 152.5 lb

## 2015-04-03 DIAGNOSIS — E118 Type 2 diabetes mellitus with unspecified complications: Secondary | ICD-10-CM

## 2015-04-03 DIAGNOSIS — IMO0002 Reserved for concepts with insufficient information to code with codable children: Secondary | ICD-10-CM

## 2015-04-03 DIAGNOSIS — E08339 Diabetes mellitus due to underlying condition with moderate nonproliferative diabetic retinopathy without macular edema: Secondary | ICD-10-CM | POA: Diagnosis not present

## 2015-04-03 DIAGNOSIS — E119 Type 2 diabetes mellitus without complications: Secondary | ICD-10-CM | POA: Diagnosis not present

## 2015-04-03 DIAGNOSIS — E083399 Diabetes mellitus due to underlying condition with moderate nonproliferative diabetic retinopathy without macular edema, unspecified eye: Secondary | ICD-10-CM

## 2015-04-03 DIAGNOSIS — E785 Hyperlipidemia, unspecified: Secondary | ICD-10-CM | POA: Diagnosis not present

## 2015-04-03 DIAGNOSIS — E1165 Type 2 diabetes mellitus with hyperglycemia: Secondary | ICD-10-CM

## 2015-04-03 DIAGNOSIS — I10 Essential (primary) hypertension: Secondary | ICD-10-CM

## 2015-04-03 LAB — HM DIABETES FOOT EXAM: HM DIABETIC FOOT EXAM: NORMAL

## 2015-04-03 LAB — LIPID PANEL
CHOL/HDL RATIO: 4
Cholesterol: 164 mg/dL (ref 0–200)
HDL: 43 mg/dL (ref >39.00)
LDL Cholesterol: 96 mg/dL (ref 0–99)
NONHDL: 121.08
Triglycerides: 124 mg/dL (ref 0.0–149.0)
VLDL: 24.8 mg/dL (ref 0.0–40.0)

## 2015-04-03 LAB — COMPREHENSIVE METABOLIC PANEL
ALBUMIN: 4.3 g/dL (ref 3.5–5.2)
ALK PHOS: 66 U/L (ref 39–117)
ALT: 27 U/L (ref 0–53)
AST: 17 U/L (ref 0–37)
BUN: 13 mg/dL (ref 6–23)
CO2: 27 mEq/L (ref 19–32)
CREATININE: 0.71 mg/dL (ref 0.40–1.50)
Calcium: 9.6 mg/dL (ref 8.4–10.5)
Chloride: 100 mEq/L (ref 96–112)
GFR: 124.29 mL/min (ref >60.00)
Glucose, Bld: 222 mg/dL — ABNORMAL HIGH (ref 70–99)
Potassium: 4.5 mEq/L (ref 3.5–5.1)
SODIUM: 136 meq/L (ref 135–145)
TOTAL PROTEIN: 7.3 g/dL (ref 6.0–8.3)
Total Bilirubin: 0.4 mg/dL (ref 0.2–1.2)

## 2015-04-03 LAB — LDL CHOLESTEROL, DIRECT: Direct LDL: 98 mg/dL

## 2015-04-03 LAB — HEMOGLOBIN A1C: HEMOGLOBIN A1C: 8 % — AB (ref 4.6–6.5)

## 2015-04-03 NOTE — Patient Instructions (Addendum)
Iam making a Referral to a different eye doctor to see if the surgery you need on your eye will be covered.  Drink 48 ounces of water minimum .  To prevent dehydration and constipation   You need to have screening colonoscopy ,   We will refer you today to a Armed forces logistics/support/administrative officer, Dr Lemar Livings or Dr Evette Cristal for your colonoscopy   Check with Employee Health about the new pneumonia vaccine called "Prevnar"

## 2015-04-03 NOTE — Progress Notes (Signed)
Pre-visit discussion using our clinic review tool. No additional management support is needed unless otherwise documented below in the visit note.  

## 2015-04-03 NOTE — Progress Notes (Signed)
Subjective:  Patient ID: Randy Grant, male    DOB: 1965/10/12  Age: 49 y.o. 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 Diagnoses of Inadequately controlled diabetes mellitus, Diabetes mellitus type 2 with complications, uncontrolled, Hyperlipidemia, and HYPERTENSION, BENIGN ESSENTIAL were also pertinent to this visit.  HPI Randy Grant presents for  Diabetes mellitus  folow up, uncontrolled.  Reports CBG to 200 only 3 times since last visit.  Has had rare hypoglycemic event,  Recently to 60  Which occurred after eating only a light meal before going to bed   Taking lantus 34 units and metformin 1000 mg before bedtime   2 am . cbgf of 60 occurred at 9 am, woke him up early.    Takes Invokana and metformin when he wakes up .has lost weight unintentionally since Feb,  Exercising too.     Hist daughter  is recovering form ortho surgery at [facility] last month..  Congenital loss of femur,    Doing well. 49 years old, walking with a walker   Eye exam noted mild  nonproliferative retinopathy,  Referral to retinal specialist was not covered by insurance.     Outpatient Prescriptions Prior to Visit  Medication Sig Dispense Refill  . aspirin 81 MG tablet Take 81 mg by mouth daily.      Marland Kitchen atorvastatin (LIPITOR) 80 MG tablet TAKE 1 TABLET BY MOUTH DAILY 90 tablet 1  . canagliflozin (INVOKANA) 300 MG TABS tablet Take 300 mg by mouth daily before breakfast. 30 tablet 3  . glucose blood (ONE TOUCH ULTRA TEST) test strip Use as instructed three times daily to check blood sugars 100 each 12  . Insulin Pen Needle (B-D ULTRAFINE III SHORT PEN) 31G X 8 MM MISC Test blood sugars 2 times daily 100 each 5  . LANTUS SOLOSTAR 100 UNIT/ML Solostar Pen INJECT 30 UNITS SUBCUTANEOUSLY DAILY **NEED APPOINTMENT** (Patient taking differently: INJECT 34 UNITS SUBCUTANEOUSLY  DAILY **NEED APPOINTMENT**) 15 mL 5  . lisinopril (PRINIVIL,ZESTRIL) 10 MG tablet TAKE 1 TABLET BY MOUTH DAILY. 90 tablet 1  . metFORMIN (GLUCOPHAGE-XR) 500 MG 24 hr tablet TAKE 2 TABLETS BY MOUTH TWO TIMES A DAY WITH A MEALS 120 tablet 11  . Multiple Vitamin (MULTIVITAMIN) tablet Take 1 tablet by mouth daily.      . ONE TOUCH ULTRA TEST test strip USE AS INSTRUCTED 100 each 5  . sildenafil (VIAGRA) 100 MG tablet Take 0.5-1 tablets (50-100 mg total) by mouth daily as needed for erectile dysfunction. (Patient not taking: Reported on 04/03/2015) 5 tablet 11   No facility-administered medications prior to visit.    Review of Systems;  Patient denies headache, fevers, malaise, unintentional weight loss, skin rash, eye pain, sinus congestion and sinus pain, sore throat, dysphagia,  hemoptysis , cough, dyspnea, wheezing, chest pain, palpitations, orthopnea, edema, abdominal pain, nausea, melena, diarrhea, constipation, flank pain, dysuria, hematuria, urinary  Frequency, nocturia, numbness, tingling, seizures,  Focal weakness, Loss of consciousness,  Tremor, insomnia, depression, anxiety, and suicidal ideation.      Objective:  BP 110/68 mmHg  Pulse 92  Temp(Src) 98.3 F (36.8 C) (Oral)  Resp 12  Ht 5\' 7"  (1.702 m)  Wt 152 lb 8 oz (69.174 kg)  BMI 23.88 kg/m2  SpO2 97%  BP Readings from Last 3 Encounters:  04/03/15 110/68  10/04/14 116/70  09/18/14 110/72    Wt Readings from Last 3  Encounters:  04/03/15 152 lb 8 oz (69.174 kg)  10/04/14 159 lb (72.122 kg)  09/18/14 160 lb 4 oz (72.689 kg)    General appearance: alert, cooperative and appears stated age Ears: normal TM's and external ear canals both ears Throat: lips, mucosa, and tongue normal; teeth and gums normal Neck: no adenopathy, no carotid bruit, supple, symmetrical, trachea midline and thyroid not enlarged, symmetric, no tenderness/mass/nodules Back: symmetric, no curvature. ROM normal. No CVA tenderness. Lungs: clear to  auscultation bilaterally Heart: regular rate and rhythm, S1, S2 normal, no murmur, click, rub or gallop Abdomen: soft, non-tender; bowel sounds normal; no masses,  no organomegaly Pulses: 2+ and symmetric Skin: Skin color, texture, turgor normal. No rashes or lesions Lymph nodes: Cervical, supraclavicular, and axillary nodes normal. Foot exam:  Nails are well trimmed,  No callouses,  Sensation intact to microfilament  Lab Results  Component Value Date   HGBA1C 8.0* 04/03/2015   HGBA1C 8.2* 09/18/2014   HGBA1C 10.9* 05/08/2014    Lab Results  Component Value Date   CREATININE 0.71 04/03/2015   CREATININE 0.83 11/01/2014   CREATININE 0.69 09/18/2014    Lab Results  Component Value Date   GLUCOSE 222* 04/03/2015   CHOL 164 04/03/2015   TRIG 124.0 04/03/2015   HDL 43.00 04/03/2015   LDLDIRECT 98.0 04/03/2015   LDLCALC 96 04/03/2015   ALT 27 04/03/2015   AST 17 04/03/2015   NA 136 04/03/2015   K 4.5 04/03/2015   CL 100 04/03/2015   CREATININE 0.71 04/03/2015   BUN 13 04/03/2015   CO2 27 04/03/2015   PSA 0.62 09/18/2014   HGBA1C 8.0* 04/03/2015   MICROALBUR 12.5* 05/08/2014    Dg Foot Complete Right  11/11/2011   *RADIOLOGY REPORT*  Clinical Data: Pain in the heel.  Pain for 1 year.  RIGHT FOOT COMPLETE - 3+ VIEW  Comparison: None.  Findings: There is a small plantar calcaneal spur. Alignment is normal.  Joint spaces are preserved.  No fracture or dislocation is evident.  No soft tissue lesions are seen.  IMPRESSION: Small plantar calcaneal spur.  Original Report Authenticated By: Crawford Givens, M.D.   Assessment & Plan:   Problem List Items Addressed This Visit      Unprioritized   Diabetes mellitus type 2 with complications, uncontrolled    A1c is improved but not at goal.  Advised to increase Lantus dose by 5 units and continue other medications .  Foot exam is normal.. Reminder for annual eye exam given.   Lab Results  Component Value Date   HGBA1C 8.0* 04/03/2015     Lab Results  Component Value Date   MICROALBUR 12.5* 05/08/2014         Hyperlipidemia    LDL and triglycerides are at goal on current medications. He has no side effects and liver enzymes are normal. No changes today   Lab Results  Component Value Date   CHOL 164 04/03/2015   HDL 43.00 04/03/2015   LDLCALC 96 04/03/2015   LDLDIRECT 98.0 04/03/2015   TRIG 124.0 04/03/2015   CHOLHDL 4 04/03/2015               HYPERTENSION, BENIGN ESSENTIAL    Well controlled on current regimen. Renal function stable, no changes today.  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 Berthold maintain his multivitamin, aspirin, glucose blood, Insulin Pen Needle, sildenafil, LANTUS SOLOSTAR, metFORMIN, canagliflozin, atorvastatin, lisinopril, and ONE TOUCH ULTRA TEST.  No orders of the defined types were placed in this encounter.    There are no discontinued medications.  Follow-up: Return in about 3 months (around 07/04/2015) for follow up diabetes.   Sherlene Shams, MD

## 2015-04-05 NOTE — Assessment & Plan Note (Signed)
LDL and triglycerides are at goal on current medications. He has no side effects and liver enzymes are normal. No changes today   Lab Results  Component Value Date   CHOL 164 04/03/2015   HDL 43.00 04/03/2015   LDLCALC 96 04/03/2015   LDLDIRECT 98.0 04/03/2015   TRIG 124.0 04/03/2015   CHOLHDL 4 04/03/2015            

## 2015-04-05 NOTE — Assessment & Plan Note (Signed)
Well controlled on current regimen. Renal function stable, no changes today.  Lab Results  Component Value Date   CREATININE 0.71 04/03/2015   Lab Results  Component Value Date   NA 136 04/03/2015   K 4.5 04/03/2015   CL 100 04/03/2015   CO2 27 04/03/2015

## 2015-04-05 NOTE — Assessment & Plan Note (Signed)
A1c is improved but not at goal.  Advised to increase Lantus dose by 5 units and continue other medications .  Foot exam is normal.. Reminder for annual eye exam given.   Lab Results  Component Value Date   HGBA1C 8.0* 04/03/2015   Lab Results  Component Value Date   MICROALBUR 12.5* 05/08/2014

## 2015-04-08 ENCOUNTER — Other Ambulatory Visit: Payer: Self-pay

## 2015-04-08 VITALS — BP 140/80 | Ht 67.0 in | Wt 154.7 lb

## 2015-04-08 DIAGNOSIS — E118 Type 2 diabetes mellitus with unspecified complications: Secondary | ICD-10-CM

## 2015-04-08 NOTE — Patient Outreach (Signed)
[facility]) Care Management  [facility] Care Manager  04/08/2015   Randy Grant [birth year] [id number]  Subjective: Patient in for his regularly scheduled Link to Wellness visit.  He saw MD last week- A1C 8%. He tells me he's checking blood sugars every day and typically gets blood sugars or /dl.   Objective:  Filed Vitals:   04/08/15 1402  BP: 140/80  Height: 1.702 m ( )  Weight: 154 lb 11.2 oz (70.171 kg)  SpO2: 98%   He did not bring his meter so I could not validate the number of times he is checking; he tells me he checks daily.    Current Medications:  Current Outpatient Prescriptions  Medication Sig Dispense Refill  . aspirin 81 MG tablet Take 81 mg by mouth daily.      Marland Kitchen atorvastatin (LIPITOR) 80 MG tablet TAKE 1 TABLET BY MOUTH DAILY 90 tablet 1  . canagliflozin (INVOKANA) 300 MG TABS tablet Take 300 mg by mouth daily before breakfast. 30 tablet 3  . Insulin Pen Needle (B-D ULTRAFINE III SHORT PEN) 31G X 8 MM MISC Test blood sugars 2 times daily 100 each 5  . LANTUS SOLOSTAR 100 UNIT/ML Solostar Pen INJECT 30 UNITS SUBCUTANEOUSLY DAILY **NEED APPOINTMENT** (Patient taking differently: INJECT 34 UNITS SUBCUTANEOUSLY DAILY **NEED APPOINTMENT**) 15 mL 5  . lisinopril (PRINIVIL,ZESTRIL) 10 MG tablet TAKE 1 TABLET BY MOUTH DAILY. 90 tablet 1  . metFORMIN (GLUCOPHAGE-XR) 500 MG 24 hr tablet TAKE 2 TABLETS BY MOUTH TWO TIMES A DAY WITH A MEALS 120 tablet 11  . Multiple Vitamin (MULTIVITAMIN) tablet Take 1 tablet by mouth daily.      Marland Kitchen glucose blood (ONE TOUCH ULTRA TEST) test strip Use as instructed three times daily to check blood sugars (Patient not taking: Reported on 04/08/2015) 100 each 12  . ONE TOUCH ULTRA TEST test strip USE AS INSTRUCTED (Patient not taking: Reported on 04/08/2015) 100 each 5  . sildenafil (VIAGRA) 100 MG tablet Take 0.5-1 tablets (50-100 mg total) by mouth daily as needed for erectile dysfunction. (Patient not taking: Reported on  04/03/2015) 5 tablet 11   No current facility-administered medications for this visit.    Functional Status:  In your present state of health, do you have any difficulty performing the following activities: 04/08/2015  Hearing? N  Vision? N  Difficulty concentrating or making decisions? N  Walking or climbing stairs? N  Dressing or bathing? N  Doing errands, shopping? N    Fall/Depression Screening: PHQ 2/9 Scores 04/08/2015  PHQ - 2 Score 0    Assessment: Much improved A1C; down from 10.9% from last year.  Complains of only 1 episode of hypoglycemia when he "didn't eat enough". No complaints of yeast related to Invokana. Up to date dilated eye and dental exam- scheduled for a colonoscopy- does not want to have it done if it is not a covered benefit.   He did not fast for Ramadan this year for fear of having low blood sugars because the days were so long during Ramadan- may try to fast in the winter when the days are shorter. Encourage to speak to Dr. Darrick Huntsman before he does it so she can advise on medication adjustments.   Plan: Patient agreed to try and increase his exercise from 2 to 4 x/week to help with blood sugar control- I have asked him to add 2 days per week, starting with 15 minutes each time. He will exercise after he wakes up and after he  eats a small breakfast.   Follow up with me in about a month.  [facility] CM Care Plan Problem One        Most Recent Value   Care Plan Problem One  A1C is 8%   Role Documenting the Problem One  Care Management Coordinator   Care Plan for Problem One  Active   [facility] Long Term Goal (31-90 days)  Decrease A1C below 8% - recheck in 3 months   [facility] Long Term Goal Start Date  04/08/15   Interventions for Problem One Long Term Goal  1. continue to exercise 2x/week for 30-40 minutes  2. add two more days of exercise for 15 minutes  3. eat protein with meals and snacks       [name], RN, BA, [place], CDE [facility] Diabetes  Coordinator- Link To [employer] Dial:  [phone]  Fax:  [phone] E-mail: [name] [address], [place], [place]  [address]

## 2015-04-09 ENCOUNTER — Encounter: Payer: Self-pay | Admitting: *Deleted

## 2015-04-19 ENCOUNTER — Other Ambulatory Visit: Payer: Self-pay | Admitting: Internal Medicine

## 2015-05-08 ENCOUNTER — Other Ambulatory Visit: Payer: Self-pay

## 2015-05-08 NOTE — Patient Outreach (Signed)
[facility]) Care Management  05/08/2015  Randy Grant 1965/10/25 [id number]  Left message on the patient's phone to give me an update on his diabetes control- committed to increasing his exercise in an attempt to decrease blood sugars.   [name], RN, BA, MHA, CDE [facility] Diabetes Coordinator- Link To [employer] Dial:  [phone]  Fax:  [phone] E-mail: [name] [address] [place], [place]  [address]

## 2015-06-13 ENCOUNTER — Other Ambulatory Visit: Payer: Self-pay | Admitting: Endocrinology

## 2015-06-13 ENCOUNTER — Other Ambulatory Visit: Payer: Self-pay | Admitting: Internal Medicine

## 2015-06-24 ENCOUNTER — Other Ambulatory Visit: Payer: Self-pay

## 2015-06-24 NOTE — Patient Outreach (Signed)
[facility]) Care Management  06/24/2015  Mancel Parsonslamin E Senne 07/05/1966 [id number]  Spoke to patient by phone today- we have scheduled him to see me on 07/22/15.  I have asked him to schedule an appointment with his MD before he sees me.    Susette RacerJulie Claudis Giovanelli, RN, BA, MHA, CDE [facility] Diabetes Coordinator- Link To [employer] Direct Dial:  [phone]  Fax:  [phone] E-mail: [email or web address] [address], [place], [place]  [id number]

## 2015-06-24 NOTE — Patient Outreach (Signed)
[facility]) Care Management  06/24/2015  Randy Grant 01/05/1966 [id number]   I called Randy Grant to get an update on his health- I have asked him to return my call.     Susette RacerJulie Arie Gable, RN, BA, MHA, CDE [facility] Diabetes Coordinator- Link To [employer] Direct Dial:  [phone]  Fax:  [phone] E-mail: [email or web address] [address], [place], [place]  [id number]

## 2015-06-25 ENCOUNTER — Telehealth: Payer: Self-pay | Admitting: Internal Medicine

## 2015-06-25 ENCOUNTER — Other Ambulatory Visit: Payer: Self-pay

## 2015-06-25 MED ORDER — METFORMIN HCL ER 500 MG PO TB24: 500.0000 mg | ORAL_TABLET | Freq: Every day | ORAL | Status: DC

## 2015-06-25 NOTE — Telephone Encounter (Signed)
Pt came in stating that he needs refills on metFORMIN (GLUCOPHAGE-XR) 500 MG 24 hr tablet and LANTUS SOLOSTAR 100 UNIT/ML Solostar Pen.. Please advise pt..Marland Kitchen

## 2015-06-28 ENCOUNTER — Encounter: Payer: Self-pay | Admitting: Internal Medicine

## 2015-06-28 ENCOUNTER — Ambulatory Visit (INDEPENDENT_AMBULATORY_CARE_PROVIDER_SITE_OTHER): Payer: 59 | Admitting: Internal Medicine

## 2015-06-28 VITALS — BP 112/70 | HR 92 | Temp 97.6°F | Wt 154.2 lb

## 2015-06-28 DIAGNOSIS — Z794 Long term (current) use of insulin: Secondary | ICD-10-CM

## 2015-06-28 DIAGNOSIS — E1165 Type 2 diabetes mellitus with hyperglycemia: Secondary | ICD-10-CM | POA: Diagnosis not present

## 2015-06-28 DIAGNOSIS — E118 Type 2 diabetes mellitus with unspecified complications: Secondary | ICD-10-CM

## 2015-06-28 DIAGNOSIS — H6123 Impacted cerumen, bilateral: Secondary | ICD-10-CM

## 2015-06-28 DIAGNOSIS — IMO0002 Reserved for concepts with insufficient information to code with codable children: Secondary | ICD-10-CM

## 2015-06-28 DIAGNOSIS — E785 Hyperlipidemia, unspecified: Secondary | ICD-10-CM | POA: Diagnosis not present

## 2015-06-28 LAB — MICROALBUMIN / CREATININE URINE RATIO
CREATININE, U: 43.6 mg/dL
MICROALB UR: 1 mg/dL (ref 0.0–1.9)
Microalb Creat Ratio: 2.3 mg/g (ref 0.0–30.0)

## 2015-06-28 MED ORDER — INSULIN GLARGINE 100 UNIT/ML SOLOSTAR PEN: 35.0000 [IU] | PEN_INJECTOR | Freq: Every day | SUBCUTANEOUS | Status: DC

## 2015-06-28 MED ORDER — CANAGLIFLOZIN 300 MG PO TABS: 300.0000 mg | ORAL_TABLET | Freq: Every day | ORAL | Status: DC

## 2015-06-28 MED ORDER — METFORMIN HCL ER 500 MG PO TB24: 500.0000 mg | ORAL_TABLET | Freq: Every day | ORAL | Status: DC

## 2015-06-28 NOTE — Progress Notes (Signed)
Subjective:  Patient ID: Randy Grant, male    DOB: 08/21/1965  Age: 49 y.o. MRN: [id number]  CC: The primary encounter diagnosis was Uncontrolled type 2 diabetes mellitus with complication, with long-term current use of insulin (HCC). Diagnoses of Cerumen impaction, bilateral and Hyperlipidemia were also pertinent to this visit.  HPI Randy Grant presents for follow up on DM Type 2 , hyperlipidemia, last seen in [place] after the departure of Dr  Welford RochePhadke who was managing his DM.  Last a1c was 8%  [facility] .  His post prandials are averaging 165,  Pre meal 98.  He is taking metformin , INVOKANNA, AND  35 Units OF Lantus DAILY    His diet is somewhat carb modified , complicated by cultural considerations and working 3d shift. He walks daily for exercise.   Foot exam normal today.  Lab Results  Component Value Date   HGBA1C 8.0* 04/03/2015      Outpatient Prescriptions Prior to Visit  Medication Sig Dispense Refill  . aspirin 81 MG tablet Take 81 mg by mouth daily.      Marland Kitchen. atorvastatin (LIPITOR) 80 MG tablet TAKE 1 TABLET BY MOUTH DAILY 90 tablet 1  . B-D ULTRAFINE III SHORT PEN 31G X 8 MM MISC TEST BLOOD SUGARS 2 TIMES DAILY 100 each 5  . glucose blood (ONE TOUCH ULTRA TEST) test strip Use as instructed three times daily to check blood sugars 100 each 12  . Insulin Pen Needle (B-D ULTRAFINE III SHORT PEN) 31G X 8 MM MISC Test blood sugars 2 times daily 100 each 5  . lisinopril (PRINIVIL,ZESTRIL) 10 MG tablet TAKE 1 TABLET BY MOUTH DAILY. 90 tablet 3  . Multiple Vitamin (MULTIVITAMIN) tablet Take 1 tablet by mouth daily.      . ONE TOUCH ULTRA TEST test strip USE AS INSTRUCTED 100 each 5  . canagliflozin (INVOKANA) 300 MG TABS tablet Take 300 mg by mouth daily before breakfast. 30 tablet 3  . LANTUS SOLOSTAR 100 UNIT/ML Solostar Pen INJECT 30 UNITS SUBCUTANEOUSLY  DAILY **NEED APPOINTMENT** (Patient taking differently: INJECT 34 UNITS SUBCUTANEOUSLY DAILY **NEED APPOINTMENT**) 15 mL 5  . metFORMIN (GLUCOPHAGE-XR) 500 MG 24 hr tablet Take 1 tablet (500 mg total) by mouth daily with breakfast. 120 tablet 0  . sildenafil (VIAGRA) 100 MG tablet Take 0.5-1 tablets (50-100 mg total) by mouth daily as needed for erectile dysfunction. (Patient not taking: Reported on 04/03/2015) 5 tablet 11   No facility-administered medications prior to visit.    Review of Systems;  Patient denies headache, fevers, malaise, unintentional weight loss, skin rash, eye pain, sinus congestion and sinus pain, sore throat, dysphagia,  hemoptysis , cough, dyspnea, wheezing, chest pain, palpitations, orthopnea, edema, abdominal pain, nausea, melena, diarrhea, constipation, flank pain, dysuria, hematuria, urinary  Frequency, nocturia, numbness, tingling, seizures,  Focal weakness, Loss of consciousness,  Tremor, insomnia, depression, anxiety, and suicidal ideation.      Objective:  BP 112/70 mmHg  Pulse 92  Temp(Src) 97.6 F (36.4 C) (Oral)  Wt 154 lb 3.2 oz (69.945 kg)  SpO2 97%  BP Readings from Last 3 Encounters:  06/28/15 112/70  04/08/15 140/80  04/03/15 110/68    Wt Readings from Last 3 Encounters:  06/28/15 154 lb 3.2 oz (69.945 kg)  04/08/15 154 lb 11.2 oz (70.171 kg)  04/03/15 152 lb 8 oz (69.174 kg)  General appearance: alert, cooperative and appears stated age Ears: normal TM's and external ear canals both ears Throat: lips, mucosa, and tongue normal; teeth and gums normal Neck: no adenopathy, no carotid bruit, supple, symmetrical, trachea midline and thyroid not enlarged, symmetric, no tenderness/mass/nodules Back: symmetric, no curvature. ROM normal. No CVA tenderness. Lungs: clear to auscultation bilaterally Heart: regular rate and rhythm, S1, S2 normal, no murmur, click, rub or gallop Abdomen: soft, non-tender; bowel sounds normal; no masses,  no  organomegaly Pulses: 2+ and symmetric Skin: Skin color, texture, turgor normal. No rashes or lesions Lymph nodes: Cervical, supraclavicular, and axillary nodes normal.  Lab Results  Component Value Date   HGBA1C 8.0* 04/03/2015   HGBA1C 8.2* 09/18/2014   HGBA1C 10.9* 05/08/2014    Lab Results  Component Value Date   CREATININE 0.71 04/03/2015   CREATININE 0.83 11/01/2014   CREATININE 0.69 09/18/2014    Lab Results  Component Value Date   GLUCOSE 222* 04/03/2015   CHOL 164 04/03/2015   TRIG 124.0 04/03/2015   HDL 43.00 04/03/2015   LDLDIRECT 98.0 04/03/2015   LDLCALC 96 04/03/2015   ALT 27 04/03/2015   AST 17 04/03/2015   NA 136 04/03/2015   K 4.5 04/03/2015   CL 100 04/03/2015   CREATININE 0.71 04/03/2015   BUN 13 04/03/2015   CO2 27 04/03/2015   PSA 0.62 09/18/2014   HGBA1C 8.0* 04/03/2015   MICROALBUR 1.0 06/28/2015    Dg Foot Complete Right  11/11/2011  *RADIOLOGY REPORT* Clinical Data: Pain in the heel.  Pain for 1 year. RIGHT FOOT COMPLETE - 3+ VIEW Comparison: None. Findings: There is a small plantar calcaneal spur. Alignment is normal.  Joint spaces are preserved.  No fracture or dislocation is evident.  No soft tissue lesions are seen. IMPRESSION: Small plantar calcaneal spur. Original Report Authenticated By: Crawford Givens, M.D.   Assessment & Plan:   Problem List Items Addressed This Visit    Diabetes mellitus type 2 with complications, uncontrolled (HCC) - Primary    A1c had improved but not at goal.  He is taking  Metformin, Invokanna and 35 units of Lantus daily, and his post prandials are still elevated.  Advised to increase Lantus once I see his a1c.  He has not been able to have his annual eye exam due to cost of opthalmology .  Foot exam is normal.. Reminder for annual eye exam given.   Lab Results  Component Value Date   HGBA1C 8.0* 04/03/2015   Lab Results  Component Value Date   MICROALBUR 1.0 06/28/2015           Relevant Medications    metFORMIN (GLUCOPHAGE-XR) 500 MG 24 hr tablet   canagliflozin (INVOKANA) 300 MG TABS tablet   Insulin Glargine (LANTUS SOLOSTAR) 100 UNIT/ML Solostar Pen   Other Relevant Orders   Microalbumin / creatinine urine ratio (Completed)   Comprehensive metabolic panel   Hemoglobin A1c   LDL cholesterol, direct   Hyperlipidemia    LDL and triglycerides are at goal on current medications. He has no side effects and liver enzymes are normal. No changes today   Lab Results  Component Value Date   CHOL 164 04/03/2015   HDL 43.00 04/03/2015   LDLCALC 96 04/03/2015   LDLDIRECT 98.0 04/03/2015   TRIG 124.0 04/03/2015   CHOLHDL 4 04/03/2015                 Cerumen impaction    Advised to use Debrox drops and  return for irrigation.          I have discontinued Randy Grant sildenafil. I have also changed his LANTUS SOLOSTAR to Insulin Glargine. Additionally, I am having him maintain his multivitamin, aspirin, glucose blood, Insulin Pen Needle, atorvastatin, ONE TOUCH ULTRA TEST, B-D ULTRAFINE III SHORT PEN, lisinopril, metFORMIN, and canagliflozin.  Meds ordered this encounter  Medications  . metFORMIN (GLUCOPHAGE-XR) 500 MG 24 hr tablet    Sig: Take 1 tablet (500 mg total) by mouth daily with breakfast.    Dispense:  120 tablet    Refill:  5    PATIENT STATES HE DIE NOT RECEIVE   PUT ON FILE IF NOT SO  . canagliflozin (INVOKANA) 300 MG TABS tablet    Sig: Take 300 mg by mouth daily before breakfast.    Dispense:  30 tablet    Refill:  3    Dose change  . Insulin Glargine (LANTUS SOLOSTAR) 100 UNIT/ML Solostar Pen    Sig: Inject 35 Units into the skin daily at 10 pm.    Dispense:  15 mL    Refill:  5    Medications Discontinued During This Encounter  Medication Reason  . metFORMIN (GLUCOPHAGE-XR) 500 MG 24 hr tablet Reorder  . canagliflozin (INVOKANA) 300 MG TABS tablet Reorder  . sildenafil (VIAGRA) 100 MG tablet   . LANTUS SOLOSTAR 100 UNIT/ML Solostar Pen Reorder      Follow-up: Return in about 3 months (around 09/28/2015) for follow up diabetes.   Sherlene Shams, MD

## 2015-06-28 NOTE — Patient Instructions (Signed)
We cannot check your A1c today because it is TOO SOON  Your last A1c was done on 8/24 so we need to wait until 11/24 to repeat it.  You can make an appointment at the front desk for the labs.  You do not need to fast.     .Your left ear is blocked with earwax,  You can use Debrox ear drops every night to soften the wax,  Or you can use Liquid Colace ( I have given you a prescription for it )   You can make an appointment for an ear cleaning with our nurse if this does not relieve the problem.  Ask Raynelle FanningJulie if [facility] has an eye doctor that does not require up front payment)  in advance

## 2015-06-30 ENCOUNTER — Encounter: Payer: Self-pay | Admitting: Internal Medicine

## 2015-06-30 DIAGNOSIS — H6120 Impacted cerumen, unspecified ear: Secondary | ICD-10-CM | POA: Insufficient documentation

## 2015-06-30 NOTE — Assessment & Plan Note (Signed)
Advised to use Debrox drops and return for irrigation.

## 2015-06-30 NOTE — Assessment & Plan Note (Signed)
LDL and triglycerides are at goal on current medications. He has no side effects and liver enzymes are normal. No changes today   Lab Results  Component Value Date   CHOL 164 04/03/2015   HDL 43.00 04/03/2015   LDLCALC 96 04/03/2015   LDLDIRECT 98.0 04/03/2015   TRIG 124.0 04/03/2015   CHOLHDL 4 04/03/2015

## 2015-06-30 NOTE — Assessment & Plan Note (Signed)
A1c had improved but not at goal.  He is taking  Metformin, Invokanna and 35 units of Lantus daily, and his post prandials are still elevated.  Advised to increase Lantus once I see his a1c.  He has not been able to have his annual eye exam due to cost of opthalmology .  Foot exam is normal.. Reminder for annual eye exam given.   Lab Results  Component Value Date   HGBA1C 8.0* 04/03/2015   Lab Results  Component Value Date   MICROALBUR 1.0 06/28/2015

## 2015-07-09 ENCOUNTER — Other Ambulatory Visit: Payer: 59

## 2015-07-15 ENCOUNTER — Other Ambulatory Visit: Payer: Self-pay | Admitting: Internal Medicine

## 2015-07-15 ENCOUNTER — Other Ambulatory Visit: Payer: Self-pay

## 2015-07-15 VITALS — BP 130/58 | Ht 67.0 in | Wt 152.9 lb

## 2015-07-15 DIAGNOSIS — E118 Type 2 diabetes mellitus with unspecified complications: Secondary | ICD-10-CM

## 2015-07-15 NOTE — Patient Outreach (Signed)
[facility]) Care Management  [facility]  07/15/2015   Randy Grant 01/12/1966 [id number]  Subjective: Patient in for his Link to Wellness visit- he brought his meter with 9 tests in the last 14 days- (average blood sugar 105mg /dl) ranging from 40-$JWJXBJYNWGNFAOZH_YQMVHQIONGEXBMWUXLKGMWNUUVOZDGUY$$QIHKVQQVZDGLOVFI_EPPIRJJOACZYSAYTKZSWFUXNATFTDDUK$62-110mg /dl in the morning and 02-$RKYHCWCBJSEGBTDV_VOHYWVPXTGGYIRSWNIOEVOJJKKXFGHWE$$XHBZJIRCVELFYBOF_BPZWCHENIDPOEUMPNTIRWERXVQMGQQPY$97-143mg /dl 2 hour post prandial. He has no complaints.  He reports that he is exercising on a treadmill 2x/week for 30 minutes.  Denies chest pain or shortness or breath.   Objective:  Filed Vitals:   07/15/15 1424  BP: 130/58  Height: 1.702 m (5\' 7" )  Weight: 152 lb 14.4 oz (69.355 kg)     Current Medications:  Current Outpatient Prescriptions  Medication Sig Dispense Refill  . aspirin 81 MG tablet Take 81 mg by mouth daily.      Marland Kitchen. atorvastatin (LIPITOR) 80 MG tablet TAKE 1 TABLET BY MOUTH DAILY 90 tablet 1  . B-D ULTRAFINE III SHORT PEN 31G X 8 MM MISC TEST BLOOD SUGARS 2 TIMES DAILY 100 each 5  . canagliflozin (INVOKANA) 300 MG TABS tablet Take 300 mg by mouth daily before breakfast. 30 tablet 3  . Insulin Glargine (LANTUS SOLOSTAR) 100 UNIT/ML Solostar Pen Inject 35 Units into the skin daily at 10 pm. 15 mL 5  . lisinopril (PRINIVIL,ZESTRIL) 10 MG tablet TAKE 1 TABLET BY MOUTH DAILY. 90 tablet 3  . metFORMIN (GLUCOPHAGE-XR) 500 MG 24 hr tablet Take 1 tablet (500 mg total) by mouth daily with breakfast. (Patient taking differently: Take 500 mg by mouth 2 (two) times daily. ) 120 tablet 5  . Multiple Vitamin (MULTIVITAMIN) tablet Take 1 tablet by mouth daily.      Marland Kitchen. glucose blood (ONE TOUCH ULTRA TEST) test strip Use as instructed three times daily to check blood sugars 100 each 12  . Insulin Pen Needle (B-D ULTRAFINE III SHORT PEN) 31G X 8 MM MISC Test blood sugars 2 times daily 100 each 5  . ONE TOUCH ULTRA TEST test strip USE AS INSTRUCTED 100 each 5   No current facility-administered medications for this visit.    Functional Status:  In your present state of health, do you  have any difficulty performing the following activities: 07/15/2015 04/08/2015  Hearing? N N  Vision? N N  Difficulty concentrating or making decisions? N N  Walking or climbing stairs? N N  Dressing or bathing? N N  Doing errands, shopping? N N    Fall/Depression Screening: PHQ 2/9 Scores 07/15/2015 04/08/2015  PHQ - 2 Score 0 0    Assessment: Blood sugras are much improved since last year. He is taking his medications and although he has a very occasional low blood sugars, he is eating 3 meals and 1 snack a day and trying to include a variety of foods with protein at most meals.    Plan:  [facility] Care [facility] Problem One        Most Recent Value   Care Plan Problem One  Potential for complications related to elevated A1C   Role Documenting the Problem One  Care Management Coordinator   Care Plan for Problem One  Active   [facility] Long Term Goal (31-90 days)  Patient will obtain an A1C less than 8% when it is checked next with MD   [facility] Start Date  07/15/15   Interventions for Problem One Long Term Goal  1. continue to take meds daily  2. continue to use the treadmill 2 x/week for 30  minutes  3. continue to check your blood sugar most days per week  4. pay off your [facility] bill so they will see you for your next  dilated eye exam     I will follow up with him next month to make sure he follows up with his A1C.   [name], RN, BA, MHA, CDE [facility] Diabetes Coordinator- Link To [employer] Dial:  [phone]  Fax:  [phone] E-mail: [name] [address] [place], [place]  [address]

## 2015-07-22 ENCOUNTER — Ambulatory Visit: Payer: 59

## 2015-08-11 DIAGNOSIS — H269 Unspecified cataract: Secondary | ICD-10-CM

## 2015-08-11 HISTORY — DX: Unspecified cataract: H26.9

## 2015-08-19 ENCOUNTER — Other Ambulatory Visit: Payer: Self-pay

## 2015-08-19 NOTE — Patient Outreach (Signed)
[facility]) Care Management  08/19/2015  Mancel Parsonslamin E Givhan 06/26/1966 [id number]   Called patient at home- he was not there.  Left message to call me.   Susette RacerJulie Dajanay Northrup, RN, BA, MHA, CDE [facility] Diabetes Coordinator- Link To [employer] Dial:  [phone]  Fax:  [phone] E-mail: [email or web address] [address], [place], [place]  [address]

## 2015-08-19 NOTE — Patient Outreach (Signed)
[facility]) Care Management  08/19/2015  Randy Grant 01/28/1966 [id number]   Spoke to patient by phone regarding A1C- he has not had it completed and it was ordered in November 2016.  He also does not have a doctor's appointment scheduled.  I have asked him to have his lab work drawn and to schedule his next appointment.  I will follow up with him in 2 weeks.     Susette RacerJulie Dequavion Follette, RN, BA, MHA, CDE [facility] Diabetes Coordinator- Link To [employer] Dial:  [phone]  Fax:  [phone] E-mail: [email or web address] [address], [place], [place]  [address]

## 2015-09-02 ENCOUNTER — Other Ambulatory Visit: Payer: Self-pay

## 2015-09-02 ENCOUNTER — Telehealth: Payer: Self-pay | Admitting: Internal Medicine

## 2015-09-02 MED ORDER — METFORMIN HCL ER 500 MG PO TB24: 500.0000 mg | ORAL_TABLET | Freq: Two times a day (BID) | ORAL | Status: DC

## 2015-09-02 NOTE — Telephone Encounter (Signed)
Rx sent 

## 2015-09-02 NOTE — Telephone Encounter (Signed)
metFORMIN (GLUCOPHAGE-XR) 500 MG 24 hr tablet 120 tablet 5 06/28/2015      Sig - Route: Take 1 tablet (500 mg total) by mouth daily with breakfast. - Oral    Patient taking differently: Take 500 mg by mouth 2 (two) times daily.         Notes to Pharmacy: PATIENT STATES HE DIE NOT RECEIVE  PUT ON FILE IF NOT SO    Patient is requesting a 90 day supply

## 2015-09-02 NOTE — Patient Outreach (Signed)
[facility]) Care Management  09/02/2015  Randy Grant 07/15/1966 [id number]   Attempted to call patient by phone to check on A1C and follow up appointment.  Unavailable by phone- unable to leave a message.  [name], RN, BA, MHA, CDE Diabetes Coordinator Inpatient Diabetes Program  [phone] (Team Pager) [phone] [facility] Office) 09/02/2015 1:11 PM

## 2015-09-03 ENCOUNTER — Other Ambulatory Visit (INDEPENDENT_AMBULATORY_CARE_PROVIDER_SITE_OTHER): Payer: 59

## 2015-09-03 DIAGNOSIS — Z794 Long term (current) use of insulin: Secondary | ICD-10-CM

## 2015-09-03 DIAGNOSIS — E118 Type 2 diabetes mellitus with unspecified complications: Secondary | ICD-10-CM | POA: Diagnosis not present

## 2015-09-03 DIAGNOSIS — E1165 Type 2 diabetes mellitus with hyperglycemia: Secondary | ICD-10-CM

## 2015-09-03 DIAGNOSIS — IMO0002 Reserved for concepts with insufficient information to code with codable children: Secondary | ICD-10-CM

## 2015-09-03 LAB — COMPREHENSIVE METABOLIC PANEL
ALT: 30 U/L (ref 0–53)
AST: 18 U/L (ref 0–37)
Albumin: 3.8 g/dL (ref 3.5–5.2)
Alkaline Phosphatase: 68 U/L (ref 39–117)
BILIRUBIN TOTAL: 0.3 mg/dL (ref 0.2–1.2)
BUN: 10 mg/dL (ref 6–23)
CO2: 27 mEq/L (ref 19–32)
CREATININE: 0.67 mg/dL (ref 0.40–1.50)
Calcium: 8.6 mg/dL (ref 8.4–10.5)
Chloride: 107 mEq/L (ref 96–112)
GFR: 132.67 mL/min (ref >60.00)
GLUCOSE: 134 mg/dL — AB (ref 70–99)
POTASSIUM: 3.9 meq/L (ref 3.5–5.1)
SODIUM: 139 meq/L (ref 135–145)
TOTAL PROTEIN: 6.3 g/dL (ref 6.0–8.3)

## 2015-09-03 LAB — HEMOGLOBIN A1C: Hgb A1c MFr Bld: 7.6 % — ABNORMAL HIGH (ref 4.6–6.5)

## 2015-09-03 LAB — LDL CHOLESTEROL, DIRECT: LDL DIRECT: 100 mg/dL

## 2015-09-11 ENCOUNTER — Encounter: Payer: Self-pay | Admitting: *Deleted

## 2015-09-19 NOTE — Patient Outreach (Signed)
[facility]) Care Management  09/19/2015  IRBIN FINES 1966-03-21 [id number]   Letter sent to patient re: no scheduled appointment and telephone attempt by myself and Dr. Darrick Huntsman to schedule appointment with both of [place].   [name], RN, BA, MHA, CDE [facility] Diabetes Coordinator- Link To [employer] Dial:  [phone]  Fax:  [phone] E-mail: [name] [address], [place], [place]  [address]

## 2015-10-15 ENCOUNTER — Encounter: Payer: Self-pay | Admitting: Internal Medicine

## 2015-10-15 ENCOUNTER — Ambulatory Visit (INDEPENDENT_AMBULATORY_CARE_PROVIDER_SITE_OTHER): Payer: 59 | Admitting: Internal Medicine

## 2015-10-15 VITALS — BP 120/70 | HR 93 | Temp 98.1°F | Resp 12 | Ht 67.0 in | Wt 158.5 lb

## 2015-10-15 DIAGNOSIS — Z125 Encounter for screening for malignant neoplasm of prostate: Secondary | ICD-10-CM | POA: Diagnosis not present

## 2015-10-15 DIAGNOSIS — Z0000 Encounter for general adult medical examination without abnormal findings: Secondary | ICD-10-CM | POA: Diagnosis not present

## 2015-10-15 DIAGNOSIS — I10 Essential (primary) hypertension: Secondary | ICD-10-CM

## 2015-10-15 DIAGNOSIS — E1165 Type 2 diabetes mellitus with hyperglycemia: Secondary | ICD-10-CM

## 2015-10-15 DIAGNOSIS — M25551 Pain in right hip: Secondary | ICD-10-CM

## 2015-10-15 DIAGNOSIS — IMO0002 Reserved for concepts with insufficient information to code with codable children: Secondary | ICD-10-CM

## 2015-10-15 DIAGNOSIS — E133299 Other specified diabetes mellitus with mild nonproliferative diabetic retinopathy without macular edema, unspecified eye: Secondary | ICD-10-CM

## 2015-10-15 DIAGNOSIS — E1365 Other specified diabetes mellitus with hyperglycemia: Secondary | ICD-10-CM

## 2015-10-15 DIAGNOSIS — E118 Type 2 diabetes mellitus with unspecified complications: Secondary | ICD-10-CM

## 2015-10-15 DIAGNOSIS — Z794 Long term (current) use of insulin: Secondary | ICD-10-CM

## 2015-10-15 MED ORDER — CYCLOBENZAPRINE HCL 10 MG PO TABS: 10.0000 mg | ORAL_TABLET | Freq: Three times a day (TID) | ORAL | Status: DC | PRN

## 2015-10-15 MED ORDER — FAMOTIDINE 20 MG PO TABS: 20.0000 mg | ORAL_TABLET | Freq: Two times a day (BID) | ORAL | Status: DC

## 2015-10-15 NOTE — Patient Instructions (Addendum)
For your hip pain:  You can use Motrin (ibupforen , Advil all are the same medication) up to 600 mg three times daily and you can add tylenol up to 1000 mg twice daily in combination   You can add flexeril  Three times daily for muscle spasm  Plain x rays of hip ordered   I have prescribed famotidine 20 mg to take up to two times daily for reflux

## 2015-10-15 NOTE — Progress Notes (Signed)
Pre-visit discussion using our clinic review tool. No additional management support is needed unless otherwise documented below in the visit note.  

## 2015-10-17 DIAGNOSIS — Z125 Encounter for screening for malignant neoplasm of prostate: Secondary | ICD-10-CM | POA: Insufficient documentation

## 2015-10-17 DIAGNOSIS — M25559 Pain in unspecified hip: Secondary | ICD-10-CM | POA: Insufficient documentation

## 2015-10-17 NOTE — Assessment & Plan Note (Signed)
A1c has improved but not at goal.  He is taking  Metformin, Invokanna and 35 units of Lantus daily, and his post prandials remain  elevated.  Advised to increase Lantus every 3 days by 3 units until his post prandials are < 150.  Rweturn for A1c on or after December 02 2015.  He has not been able to have his opthalmology referral due to cost of opthalmology .  Foot exam is normal.  Lab Results  Component Value Date   HGBA1C 7.6* 09/03/2015   Lab Results  Component Value Date   MICROALBUR 1.0 06/28/2015

## 2015-10-17 NOTE — Assessment & Plan Note (Signed)
Well controlled on current regimen. Renal function stable, no changes today.  Lab Results  Component Value Date   CREATININE 0.67 09/03/2015   Lab Results  Component Value Date   NA 139 09/03/2015   K 3.9 09/03/2015   CL 107 09/03/2015   CO2 27 09/03/2015    

## 2015-10-17 NOTE — Assessment & Plan Note (Signed)
PSA is normal  .   Lab Results  Component Value Date   PSA 0.62 09/18/2014   PSA 0.56 12/08/2012

## 2015-10-17 NOTE — Assessment & Plan Note (Addendum)
Right hip  Has ROM that  is normal, but pain is brought on by lying on opposite side , suggesting either bone spurring on the medial side of hip joint, or trochanteric bursitis. Plain films ordered,  NSAID and tylenol. Advised

## 2015-10-17 NOTE — Progress Notes (Signed)
Patient ID: Randy Grant, male    DOB: 05/08/1966  Age: 50 y.o. MRN: [id number]  The patient is here for annual  wellness examination and management of other chronic and acute problems.including  follow up on type 2 DM,.hypertension and hip pain.  His diabetes is currently managed with metformin, Lantus and Invokkanna.  Patient is following a low glycemic index diet and taking all prescribed medications regularly without side effects.  Fasting sugars have been under less than 150 most of the time and post prandials have been under 180 except on rare occasions. Patient is exercising about 3 times per week and is not overweight.  Patient has not had an eye exam in the last 12 months, was diagnosed with mild non proliferative retinopathy and was referred to a specialist in March 2016 by his optometrist, Randy Grant.  He checks feet regularly for signs of infection.  Patient does not walk barefoot outside,  And denies any numbness tingling or burning in feet. Patient is up to date on all recommended vaccinations.   Still having right hip pain that radiates down right leg  aggravated by lying on his left side.  He has no history of hip injury ,  The pain is most bothersome  at night when he is asleep and wakes him occasionally from sleep . Using motrin 600 mg prn with good relief of symptoms.  No pain with climbing stairs.       The risk factors are reflected in the social history.  The roster of all physicians providing medical care to patient - is listed in the Snapshot section of the chart.  Activities of daily living:  The patient is 100% independent in all ADLs: dressing, toileting, feeding as well as independent mobility  Home safety : The patient has smoke detectors in the home. They wear seatbelts.  There are no firearms at home. There is no violence in the home.   There is no risks for hepatitis, STDs or HIV. There is no   history of blood transfusion. They have no travel history to  infectious disease endemic areas of the world.  The patient has seen their dentist in the last six month. They have not seen their eye doctor in the last year. They admit to slight hearing difficulty with regard to whispered voices and some television programs.  They have deferred audiologic testing in the last year.  They do not  have excessive sun exposure. Discussed the need for sun protection: hats, long sleeves and use of sunscreen if there is significant sun exposure.   Diet: the importance of a healthy diet is discussed. They do have a healthy diet.  The benefits of regular aerobic exercise were discussed. She walks 4 times per week ,  20 minutes.   Depression screen: there are no signs or vegative symptoms of depression- irritability, change in appetite, anhedonia, sadness/tearfullness.  Cognitive assessment: the patient manages all their financial and personal affairs and is actively engaged. They could relate day,date,year and events; recalled 2/3 objects at 3 minutes; performed clock-face test normally.  The following portions of the patient's history were reviewed and updated as appropriate: allergies, current medications, past family history, past medical history,  past surgical history, past social history  and problem list.  Visual acuity was not assessed per patient preference since she has regular follow up with her ophthalmologist. Hearing and body mass index were assessed and reviewed.   During the course of the visit the patient was  educated and counseled about appropriate screening and preventive services including : fall prevention , diabetes screening, nutrition counseling, colorectal cancer screening, and recommended immunizations.    CC: The primary encounter diagnosis was Hip pain, acute, right. 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  History Randy Grant has a past medical history of Hyperlipidemia; Hypertension; Diabetes mellitus; and GERD (gastroesophageal reflux disease).   He has no past surgical history on file.   His family history includes Diabetes in his father, mother, sister, and sister; Hyperlipidemia in his father.He reports that he quit smoking about 14 years ago. He has never used smokeless tobacco. He reports that he does not drink alcohol or use illicit drugs.  Outpatient Prescriptions Prior to Visit  Medication Sig Dispense Refill  . aspirin 81 MG tablet Take 81 mg by mouth daily.      Marland Kitchen atorvastatin (LIPITOR) 80 MG tablet TAKE 1 TABLET BY MOUTH DAILY 90 tablet 1  . B-D ULTRAFINE III SHORT PEN 31G X 8 MM MISC TEST BLOOD SUGARS 2 TIMES DAILY 100 each 5  . canagliflozin (INVOKANA) 300 MG TABS tablet Take 300 mg by mouth daily before breakfast. 30 tablet 3  . glucose blood (ONE TOUCH ULTRA TEST) test strip Use as instructed three times daily to check blood sugars 100 each 12  . Insulin Glargine (LANTUS SOLOSTAR) 100 UNIT/ML Solostar Pen Inject 35 Units into the skin daily at 10 pm. 15 mL 5  . Insulin Pen Needle (B-D ULTRAFINE III SHORT PEN) 31G X 8 MM MISC Test blood sugars 2 times daily 100 each 5  . lisinopril (PRINIVIL,ZESTRIL) 10 MG tablet TAKE 1 TABLET BY MOUTH DAILY. 90 tablet 3  . metFORMIN (GLUCOPHAGE-XR) 500 MG 24 hr tablet Take 1 tablet (500 mg total) by mouth 2 (two) times daily. 360 tablet 11  . Multiple Vitamin (MULTIVITAMIN) tablet Take 1 tablet by mouth daily.      . ONE TOUCH ULTRA TEST test strip USE AS INSTRUCTED 100 each 5   No facility-administered medications prior to visit.    Review of Systems   Patient denies headache, fevers, malaise, unintentional weight loss, skin rash, eye pain, sinus  congestion and sinus pain, sore throat, dysphagia,  hemoptysis , cough, dyspnea, wheezing, chest pain, palpitations, orthopnea, edema, abdominal pain, nausea, melena, diarrhea, constipation, flank pain, dysuria, hematuria, urinary  Frequency, nocturia, numbness, tingling, seizures,  Focal weakness, Loss of consciousness,  Tremor, insomnia, depression, anxiety, and suicidal ideation.      Objective:  BP 120/70 mmHg  Pulse 93  Temp(Src) 98.1 F (36.7 C) (Oral)  Resp 12  Ht  (1.702 m)  Wt 158 lb 8 oz (71.895 kg)  BMI 24.82 kg/m2  SpO2 99%  Physical Exam   General appearance: alert, cooperative and appears stated age Ears: normal TM's and external ear canals both ears Throat: lips, mucosa, and tongue normal; teeth and gums normal Neck: no adenopathy, no carotid bruit, supple, symmetrical, trachea midline and thyroid not enlarged, symmetric, no tenderness/mass/nodules Back: symmetric, no curvature. ROM normal. No CVA tenderness. Lungs: clear to auscultation bilaterally Heart: regular rate and rhythm, S1, S2 normal, no murmur, click, rub or gallop Abdomen: soft, non-tender; bowel sounds normal; no masses,  no organomegaly Pulses: 2+ and symmetric Skin: Skin  color, texture, turgor normal. No rashes or lesions Lymph nodes: Cervical, supraclavicular, and axillary nodes normal.    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  He is taking  Metformin, Invokanna and 35 units of Lantus daily, and his post prandials remain  elevated.  Advised to increase Lantus every 3 days by 3 units until his post prandials are < 150.  Rweturn for A1c on or after December 02 2015.  He has not been able to have his opthalmology referral due to cost of opthalmology .  Foot exam is normal.  Lab Results  Component Value Date   HGBA1C 7.6*  09/03/2015   Lab Results  Component Value Date   MICROALBUR 1.0 06/28/2015             HYPERTENSION, BENIGN ESSENTIAL    Well controlled on current regimen. Renal function stable, no changes today.  Lab Results  Component Value Date   CREATININE 0.67 09/03/2015   Lab Results  Component Value Date   NA 139 09/03/2015   K 3.9 09/03/2015   CL 107 09/03/2015   CO2 27 09/03/2015         Encounter for preventive health examination    Annual comprehensive preventive exam was done as well as an evaluation and management of acute and chronic conditions .  During the course of the visit the patient was educated and counseled about appropriate screening and preventive services including :prostate and colorectal cancer screening, and recommended immunizations.  Printed recommendations for health maintenance screenings was given.       Hip pain, acute - Primary    Right hip  Has ROM that  is normal, but pain is brought on by lying on opposite side , suggesting either bone spurring on the medial side of hip joint, or trochanteric bursitis. Plain films ordered,  NSAID and tylenol. Advised       Relevant Orders   DG HIP UNILAT WITH PELVIS 2-3 VIEWS RIGHT   Prostate cancer screening    PSA is normal  .   Lab Results  Component Value Date   PSA 0.62 09/18/2014   PSA 0.56 12/08/2012        Relevant Orders   PSA      I am having Mr. Musto start on famotidine. I am also having him maintain his multivitamin, aspirin, glucose blood, Insulin Pen Needle, ONE TOUCH ULTRA TEST, B-D ULTRAFINE III SHORT PEN, lisinopril, canagliflozin, Insulin Glargine, atorvastatin, metFORMIN, and cyclobenzaprine.  Meds ordered this encounter  Medications  . cyclobenzaprine (FLEXERIL) 10 MG tablet    Sig: Take 1 tablet (10 mg total) by mouth 3 (three) times daily as needed for muscle spasms.    Dispense:  30 tablet    Refill:  1  . famotidine (PEPCID) 20 MG tablet    Sig: Take 1 tablet (20 mg  total) by mouth 2 (two) times daily. For reflux/indigestion    Dispense:  60 tablet    Refill:  2    Medications Discontinued During This Encounter  Medication Reason  . cyclobenzaprine (FLEXERIL) 10 MG tablet Reorder    Follow-up: Return fastign  labs early May, for follow up diabetes.   Sherlene Shams, MD

## 2015-10-17 NOTE — Assessment & Plan Note (Signed)
Annual comprehensive preventive exam was done as well as an evaluation and management of acute and chronic conditions .  During the course of the visit the patient was educated and counseled about appropriate screening and preventive services including :prostate and colorectal cancer screening, and recommended immunizations.  Printed recommendations for health maintenance screenings was given.

## 2015-10-21 ENCOUNTER — Ambulatory Visit (INDEPENDENT_AMBULATORY_CARE_PROVIDER_SITE_OTHER)
Admission: RE | Admit: 2015-10-21 | Discharge: 2015-10-21 | Disposition: A | Discharge status: Home / Self Care | Payer: 59 | Source: Ambulatory Visit | Attending: Internal Medicine | Admitting: Internal Medicine

## 2015-10-21 DIAGNOSIS — M25551 Pain in right hip: Secondary | ICD-10-CM

## 2015-10-29 ENCOUNTER — Encounter: Payer: Self-pay | Admitting: *Deleted

## 2015-10-29 ENCOUNTER — Telehealth: Payer: Self-pay

## 2015-10-29 DIAGNOSIS — M25551 Pain in right hip: Secondary | ICD-10-CM

## 2015-10-29 NOTE — Telephone Encounter (Signed)
I notified pt of test results, pt verbalized understanding and appreciation. Pt is fine with seeing an Orthopedist. Please advise, thanks

## 2015-10-29 NOTE — Telephone Encounter (Signed)
Your referral is in process as requested. Our referral coordinator will call you when the appointment has been made.  

## 2015-10-30 NOTE — Telephone Encounter (Signed)
Notified pt. 

## 2015-11-04 ENCOUNTER — Other Ambulatory Visit: Payer: Self-pay

## 2015-11-04 VITALS — BP 112/76 | Ht 67.0 in | Wt 157.0 lb

## 2015-11-04 DIAGNOSIS — M4696 Unspecified inflammatory spondylopathy, lumbar region: Secondary | ICD-10-CM | POA: Diagnosis not present

## 2015-11-04 DIAGNOSIS — M533 Sacrococcygeal disorders, not elsewhere classified: Secondary | ICD-10-CM | POA: Diagnosis not present

## 2015-11-04 DIAGNOSIS — Z794 Long term (current) use of insulin: Secondary | ICD-10-CM

## 2015-11-04 DIAGNOSIS — E1165 Type 2 diabetes mellitus with hyperglycemia: Secondary | ICD-10-CM

## 2015-11-04 NOTE — Patient Outreach (Signed)
[facility]) Care Management  [facility]  11/04/2015   Randy Grant 10/02/1965 [id number]  Subjective: Randy Grant was in for his Link to Wellness diabetes visit.  He recently saw Dr. Darrick Huntsmanullo and today he saw Dr. Hyacinth MeekerMiller for pain in his right hip (when he lies on his left hip).  Dr. Hyacinth MeekerMiller did complete x-rays of the patient's legs and hips and found nothing of significance- Colleen tells me he found a little inflammation in the nerve that runs across his buttock (right) and has ordered medication for him.   Objective:  Filed Vitals:   11/04/15 1313  BP: 112/76  Height: 1.702 m (5\' 7" )  Weight: 157 lb (71.215 kg)     Current Medications:  Current Outpatient Prescriptions  Medication Sig Dispense Refill  . aspirin 81 MG tablet Take 81 mg by mouth daily.      Marland Kitchen. atorvastatin (LIPITOR) 80 MG tablet TAKE 1 TABLET BY MOUTH DAILY 90 tablet 1  . B-D ULTRAFINE III SHORT PEN 31G X 8 MM MISC TEST BLOOD SUGARS 2 TIMES DAILY 100 each 5  . canagliflozin (INVOKANA) 300 MG TABS tablet Take 300 mg by mouth daily before breakfast. 30 tablet 3  . cyclobenzaprine (FLEXERIL) 10 MG tablet Take 1 tablet (10 mg total) by mouth 3 (three) times daily as needed for muscle spasms. 30 tablet 1  . famotidine (PEPCID) 20 MG tablet Take 1 tablet (20 mg total) by mouth 2 (two) times daily. For reflux/indigestion 60 tablet 2  . glucose blood (ONE TOUCH ULTRA TEST) test strip Use as instructed three times daily to check blood sugars 100 each 12  . Insulin Glargine (LANTUS SOLOSTAR) 100 UNIT/ML Solostar Pen Inject 35 Units into the skin daily at 10 pm. 15 mL 5  . Insulin Pen Needle (B-D ULTRAFINE III SHORT PEN) 31G X 8 MM MISC Test blood sugars 2 times daily 100 each 5  . lisinopril (PRINIVIL,ZESTRIL) 10 MG tablet TAKE 1 TABLET BY MOUTH DAILY. 90 tablet 3  . metFORMIN (GLUCOPHAGE-XR) 500 MG 24 hr tablet Take 1 tablet (500 mg total) by mouth 2 (two) times daily. 360 tablet 11  . Multiple Vitamin  (MULTIVITAMIN) tablet Take 1 tablet by mouth daily.      . ONE TOUCH ULTRA TEST test strip USE AS INSTRUCTED 100 each 5   No current facility-administered medications for this visit.    Functional Status:  In your present state of health, do you have any difficulty performing the following activities: 07/15/2015 04/08/2015  Hearing? N N  Vision? N N  Difficulty concentrating or making decisions? N N  Walking or climbing stairs? N N  Dressing or bathing? N N  Doing errands, shopping? N N    Fall/Depression Screening: PHQ 2/9 Scores 11/04/2015 07/15/2015 04/08/2015  PHQ - 2 Score 0 0 0    Assessment: Patient did not bring his meter with him to today's visit. He tells me if his blood sugar had been 120- 170mg /dl when he goes to bed in the evenings and generally around 120mg /dl in the morning.  He did not understand what Dr. Darrick Huntsmanullo asked him to do with his Lantus insulin(increase 3 units q3 days) and reports to me today that "since I saw Dr. Darrick Huntsmanullo, my evening blood sugars have been less than 150mg /dl".  I have written Dr. Melina Schoolsullo's instruction down.  He is currently taking 35 units of Lantus at bedtime. He was not willing to write down his blood sugars when he takes them  and was unwilling to write down the amount of Lantus he takes each night (to determine when he increases the dose of Lantus)  Plan: If blood sugars after a meal are consistently greater than /dl, increase Lantus by 3 units every 3 days until post meal blood sugars are less than /dl.   Return to drawn labs before April 24th.   Do the exercises Dr. Hyacinth Meeker assigned today.   Begin taking the oral medications Dr. Hyacinth Meeker has ordered for you.    Continue to check blood sugars 1-2 times per day.  [name], RN, BA, MHA, CDE Diabetes Coordinator Inpatient Diabetes Program  [phone] (Team Pager) [phone] [facility] Office) 11/04/2015 3:05 PM

## 2015-11-04 NOTE — Addendum Note (Signed)
Addended by: Suszanne ConnersMONTPELLIER, Jeanne Terrance M on: 11/04/2015 03:09 PM   Modules accepted: Medications

## 2015-12-02 ENCOUNTER — Other Ambulatory Visit: Payer: Self-pay

## 2015-12-02 NOTE — Patient Outreach (Signed)
[facility]) Care Management  12/02/2015  Randy Grant 02/28/1966 [id number]   Patient has called me to tell me he has scheduled his MD visit for June 7th and will have his labwork done the week before.    Susette RacerJulie Kenon Delashmit, RN, BA, MHA, CDE [facility] Diabetes Coordinator- Link To [employer] Direct Dial:  [phone]  Fax:  [phone] E-mail: [email or web address] [address], [place], [place]  [id number]

## 2015-12-02 NOTE — Patient Outreach (Signed)
[facility]) Care Management  12/02/2015  Randy Grant 02/04/1966 [id number]  Spoke to patient by phone.  He reports he has been taking between 35-38 units of Lantus every night.  I explained that it was supposed to be a consistent amount so I have recommended he stay at 38 units since he does not seem to understand the concept Dr. Darrick Huntsmanullo was asking him to do. Every night he changes the dose based on that morning's blood sugar. He reports fasting blood sugars are 85-90 mg/dl and 2 hour after eating blood sugars are around 150mg /dl.  He denies any blood sugar above 200mg /dl or any symptoms of low blood sugar although he did have a sugar of 56mg /dl at home one day when he took his Lantus and his Metformin but did not eat.   Randy Grant has not had his A1C drawn - I have asked him to schedule an appointment with Dr. Darrick Huntsmanullo by the end of this week and notify of the appointment time.     Susette RacerJulie Levie Owensby, RN, BA, MHA, CDE [facility] Diabetes Coordinator- Link To [employer] Direct Dial:  [phone]  Fax:  [phone] E-mail: [email or web address] [address], [place], [place]  [id number]

## 2016-01-08 ENCOUNTER — Other Ambulatory Visit: Payer: Self-pay

## 2016-01-08 NOTE — Patient Outreach (Signed)
[facility]) Care Management  01/08/2016  Randy Grant 10/10/1965 [id number]   I have left a telephone message with Alven regarding scheduling a visit with me for next week.  In reviewing his EMR, Franco  told me he has a scheduled appointment with Dr. Darrick Huntsmanullo on June 7th, there is not a scheduled appointment noted in the EMR.[name].     Arther Heisler, RN, BA, MHA, CDE [facility] Diabetes Coordinator- Link To Wellness Direct Dial:  [phone]  Fax:  [phone] E-mail: [email or web address] [address], [place], [place]  [id number]

## 2016-01-29 ENCOUNTER — Other Ambulatory Visit: Payer: Self-pay

## 2016-01-29 NOTE — Patient Outreach (Signed)
[facility]) Care Management  01/29/2016  [name] 12/12/1965 [id number]   Randy Grant has returned a call to me in response to the letter he received at home regarding the Link to Wellness program.  Mauro assures me on the phone that he will obtain the A1C next week and schedule with Dr. Darrick Huntsmanullo. As of today, he does not have an appointment scheduled or the A1C complete.  I have sent a message to him to see if 7/10 @ 1pm will fit his schedule to follow up with me.   Susette RacerJulie Akeelah Seppala, RN, BA, MHA, CDE [facility] Diabetes Coordinator- Link To [employer] Direct Dial:  [phone]  Fax:  [phone] E-mail: [email or web address] [address], [place], [place]  [id number]

## 2016-01-30 ENCOUNTER — Other Ambulatory Visit: Payer: Self-pay | Admitting: Internal Medicine

## 2016-01-30 ENCOUNTER — Telehealth: Payer: Self-pay | Admitting: Internal Medicine

## 2016-01-30 MED ORDER — CANAGLIFLOZIN 300 MG PO TABS: 300.0000 mg | ORAL_TABLET | Freq: Every day | ORAL | Status: DC

## 2016-01-30 NOTE — Telephone Encounter (Signed)
Refill of medication is only for 30 days so patient needs to complete his labs and schedule appointment to receive more refills.

## 2016-01-30 NOTE — Telephone Encounter (Signed)
Pt is requesting a refill on his canagliflozin (INVOKANA) 300 MG TABS tablet. Pt will be making an appointment to see Dr. Darrick Huntsmanullo.

## 2016-01-31 NOTE — Telephone Encounter (Signed)
Patient appointment scheduled

## 2016-02-17 ENCOUNTER — Other Ambulatory Visit: Payer: Self-pay

## 2016-02-17 ENCOUNTER — Encounter: Payer: Self-pay | Admitting: Internal Medicine

## 2016-02-17 ENCOUNTER — Ambulatory Visit (INDEPENDENT_AMBULATORY_CARE_PROVIDER_SITE_OTHER): Payer: 59 | Admitting: Internal Medicine

## 2016-02-17 VITALS — BP 138/74 | HR 94 | Temp 97.7°F | Resp 12 | Ht 67.0 in | Wt 153.5 lb

## 2016-02-17 DIAGNOSIS — E118 Type 2 diabetes mellitus with unspecified complications: Secondary | ICD-10-CM | POA: Diagnosis not present

## 2016-02-17 DIAGNOSIS — Z794 Long term (current) use of insulin: Secondary | ICD-10-CM | POA: Diagnosis not present

## 2016-02-17 DIAGNOSIS — Z125 Encounter for screening for malignant neoplasm of prostate: Secondary | ICD-10-CM

## 2016-02-17 DIAGNOSIS — E1165 Type 2 diabetes mellitus with hyperglycemia: Secondary | ICD-10-CM

## 2016-02-17 DIAGNOSIS — E1365 Other specified diabetes mellitus with hyperglycemia: Secondary | ICD-10-CM

## 2016-02-17 DIAGNOSIS — Z9119 Patient's noncompliance with other medical treatment and regimen: Secondary | ICD-10-CM

## 2016-02-17 DIAGNOSIS — E133299 Other specified diabetes mellitus with mild nonproliferative diabetic retinopathy without macular edema, unspecified eye: Secondary | ICD-10-CM

## 2016-02-17 DIAGNOSIS — I10 Essential (primary) hypertension: Secondary | ICD-10-CM | POA: Diagnosis not present

## 2016-02-17 DIAGNOSIS — Z91199 Patient's noncompliance with other medical treatment and regimen due to unspecified reason: Secondary | ICD-10-CM

## 2016-02-17 DIAGNOSIS — IMO0002 Reserved for concepts with insufficient information to code with codable children: Secondary | ICD-10-CM

## 2016-02-17 LAB — COMPREHENSIVE METABOLIC PANEL
ALBUMIN: 4.4 g/dL (ref 3.5–5.2)
ALK PHOS: 75 U/L (ref 39–117)
ALT: 20 U/L (ref 0–53)
AST: 14 U/L (ref 0–37)
BUN: 15 mg/dL (ref 6–23)
CO2: 28 mEq/L (ref 19–32)
CREATININE: 0.76 mg/dL (ref 0.40–1.50)
Calcium: 9.3 mg/dL (ref 8.4–10.5)
Chloride: 102 mEq/L (ref 96–112)
GFR: 114.5 mL/min (ref >60.00)
GLUCOSE: 158 mg/dL — AB (ref 70–99)
Potassium: 3.9 mEq/L (ref 3.5–5.1)
SODIUM: 138 meq/L (ref 135–145)
TOTAL PROTEIN: 7 g/dL (ref 6.0–8.3)
Total Bilirubin: 0.5 mg/dL (ref 0.2–1.2)

## 2016-02-17 LAB — HEMOGLOBIN A1C: HEMOGLOBIN A1C: 7.8 % — AB (ref 4.6–6.5)

## 2016-02-17 LAB — LIPID PANEL
CHOLESTEROL: 210 mg/dL — AB (ref 0–200)
HDL: 43.3 mg/dL (ref >39.00)
LDL Cholesterol: 139 mg/dL — ABNORMAL HIGH (ref 0–99)
NonHDL: 167.05
Total CHOL/HDL Ratio: 5
Triglycerides: 138 mg/dL (ref 0.0–149.0)
VLDL: 27.6 mg/dL (ref 0.0–40.0)

## 2016-02-17 LAB — LDL CHOLESTEROL, DIRECT: LDL DIRECT: 143 mg/dL

## 2016-02-17 LAB — MICROALBUMIN / CREATININE URINE RATIO
CREATININE, U: 90.1 mg/dL
Microalb Creat Ratio: 1.3 mg/g (ref 0.0–30.0)
Microalb, Ur: 1.2 mg/dL (ref 0.0–1.9)

## 2016-02-17 LAB — PSA: PSA: 0.65 ng/mL (ref 0.10–4.00)

## 2016-02-17 MED ORDER — INSULIN GLARGINE 100 UNIT/ML SOLOSTAR PEN: 35.0000 [IU] | PEN_INJECTOR | Freq: Every day | SUBCUTANEOUS | Status: DC

## 2016-02-17 MED ORDER — CANAGLIFLOZIN 300 MG PO TABS: 300.0000 mg | ORAL_TABLET | Freq: Every day | ORAL | Status: DC

## 2016-02-17 NOTE — Progress Notes (Signed)
Subjective:  Patient ID: Randy Grant, male    DOB: 1966-04-28  Age: 50 y.o. MRN: [id number]  CC: The primary encounter diagnosis was HYPERTENSION, BENIGN ESSENTIAL. 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  HPI Nicholos E Pfarr presents for   Diabetes follow up. Last a1c in January   He feels generally well, is exercising several times per week and checking blood sugars once daily at variable times.  BS have been under 80 to 114 during Ramadan fasting and an unusual high of 180 post prandially . Had one low during Ramadan of 70   So he reduce lantus dose to 25 units. Denies any recent hypoglyemic events.  Taking his medications as directed. Following a carbohydrate modified diet 6 days per week.  Has occasional numbness, burning and tingling of extremities. Appetite is good.     Lab Results  Component Value Date   HGBA1C 7.8* 02/17/2016      Outpatient Prescriptions Prior to Visit  Medication Sig Dispense Refill  . aspirin 81 MG tablet Take 81 mg by mouth daily.      Marland Kitchen atorvastatin (LIPITOR) 80 MG tablet TAKE 1 TABLET BY MOUTH DAILY 90 tablet 1  . B-D ULTRAFINE III SHORT PEN 31G X 8 MM MISC TEST BLOOD SUGARS 2 TIMES DAILY 100 each 5  . canagliflozin (INVOKANA) 300 MG TABS tablet Take 1 tablet (300 mg total) by mouth daily before breakfast. 30 tablet 0  . glucose blood (ONE TOUCH ULTRA TEST) test strip Use as instructed three times daily to check blood sugars 100 each 12  . Insulin Pen Needle (B-D ULTRAFINE III SHORT PEN) 31G X 8 MM MISC Test blood sugars 2 times daily 100 each 5  . lisinopril (PRINIVIL,ZESTRIL) 10 MG tablet TAKE 1 TABLET BY MOUTH DAILY. 90 tablet 3  . metFORMIN  (GLUCOPHAGE-XR) 500 MG 24 hr tablet Take 1 tablet (500 mg total) by mouth 2 (two) times daily. 360 tablet 11  . Multiple Vitamin (MULTIVITAMIN) tablet Take 1 tablet by mouth daily.      . ONE TOUCH ULTRA TEST test strip USE AS INSTRUCTED 100 each 5  . Insulin Glargine (LANTUS SOLOSTAR) 100 UNIT/ML Solostar Pen Inject 35 Units into the skin daily at 10 pm. 15 mL 5  . cyclobenzaprine (FLEXERIL) 10 MG tablet Take 1 tablet (10 mg total) by mouth 3 (three) times daily as needed for muscle spasms. (Patient not taking: Reported on 02/17/2016) 30 tablet 1  . famotidine (PEPCID) 20 MG tablet Take 1 tablet (20 mg total) by mouth 2 (two) times daily. For reflux/indigestion (Patient not taking: Reported on 02/17/2016) 60 tablet 2  . meloxicam (MOBIC) 15 MG tablet Take 15 mg by mouth daily. Reported on 02/17/2016     No facility-administered medications prior to visit.    Review of Systems;  Patient denies headache, fevers, malaise, unintentional weight loss, skin rash, eye pain, sinus congestion and sinus pain, sore throat, dysphagia,  hemoptysis , cough, dyspnea, wheezing, chest pain, palpitations, orthopnea, edema, abdominal pain, nausea, melena, diarrhea, constipation, flank pain, dysuria, hematuria, urinary  Frequency, nocturia, numbness, tingling, seizures,  Focal weakness, Loss of consciousness,  Tremor, insomnia, depression, anxiety, and suicidal ideation.      Objective:  BP 138/74 mmHg  Pulse 94  Temp(Src)  97.7 F (36.5 C) (Oral)  Resp 12  Ht 5\' 7"  (1.702 m)  Wt 153 lb 8 oz (69.627 kg)  BMI 24.04 kg/m2  SpO2 98%  BP Readings from Last 3 Encounters:  02/17/16 138/74  11/04/15 112/76  10/15/15 120/70    Wt Readings from Last 3 Encounters:  02/17/16 153 lb 8 oz (69.627 kg)  11/04/15 157 lb (71.215 kg)  10/15/15 158 lb 8 oz (71.895 kg)    General appearance: alert, cooperative and appears stated age Ears: normal TM's and external ear canals both ears Throat: lips, mucosa, and tongue  normal; teeth and gums normal Neck: no adenopathy, no carotid bruit, supple, symmetrical, trachea midline and thyroid not enlarged, symmetric, no tenderness/mass/nodules Back: symmetric, no curvature. ROM normal. No CVA tenderness. Lungs: clear to auscultation bilaterally Heart: regular rate and rhythm, S1, S2 normal, no murmur, click, rub or gallop Abdomen: soft, non-tender; bowel sounds normal; no masses,  no organomegaly Pulses: 2+ and symmetric Skin: Skin color, texture, turgor normal. No rashes or lesions Lymph nodes: Cervical, supraclavicular, and axillary nodes normal.  Lab Results  Component Value Date   HGBA1C 7.8* 02/17/2016   HGBA1C 7.6* 09/03/2015   HGBA1C 8.0* 04/03/2015    Lab Results  Component Value Date   CREATININE 0.76 02/17/2016   CREATININE 0.67 09/03/2015   CREATININE 0.71 04/03/2015    Lab Results  Component Value Date   GLUCOSE 158* 02/17/2016   CHOL 210* 02/17/2016   TRIG 138.0 02/17/2016   HDL 43.30 02/17/2016   LDLDIRECT 143.0 02/17/2016   LDLCALC 139* 02/17/2016   ALT 20 02/17/2016   AST 14 02/17/2016   NA 138 02/17/2016   K 3.9 02/17/2016   CL 102 02/17/2016   CREATININE 0.76 02/17/2016   BUN 15 02/17/2016   CO2 28 02/17/2016   PSA 0.65 02/17/2016   HGBA1C 7.8* 02/17/2016   MICROALBUR 1.2 02/17/2016    Dg Hip Unilat With Pelvis 2-3 Views Right  10/21/2015  CLINICAL DATA:  Acute right hip pain. EXAM: DG HIP (WITH OR WITHOUT PELVIS) 2-3V RIGHT COMPARISON:  None. FINDINGS: There is no evidence of hip fracture or dislocation. There is no evidence of arthropathy or other focal bone abnormality. IMPRESSION: Normal right hip. Electronically Signed   By: Lupita Raider, M.D.   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  Advised to increase the Lantus to 30 units and avoid skipping meals .  Lab Results  Component Value Date   HGBA1C 7.8* 02/17/2016   Lab Results  Component Value Date   MICROALBUR 1.2 02/17/2016         Relevant Medications   canagliflozin (INVOKANA) 300 MG TABS tablet   Insulin Glargine (LANTUS SOLOSTAR) 100 UNIT/ML Solostar Pen   HYPERTENSION, BENIGN ESSENTIAL - Primary    Well controlled on current regimen. Renal function stable, no changes today.  Lab Results  Component Value Date   CREATININE 0.67 09/03/2015   Lab Results  Component Value Date   NA 139 09/03/2015   K 3.9 09/03/2015   CL 107 09/03/2015   CO2 27 09/03/2015         Noncompliance with diabetes treatment    Now taking Lantus, Invokanna and metformin with reports of improved control.  [facility] Patient outreach has been helpful in reminding patient to keep appointments for diabetes follow up       Prostate cancer screening    Other Visit Diagnoses    Uncontrolled type 2 diabetes mellitus with complication, with long-term current use of insulin (HCC)        Relevant Medications    canagliflozin (INVOKANA) 300 MG TABS tablet    Insulin Glargine (LANTUS SOLOSTAR) 100 UNIT/ML Solostar Pen      A total of 25 minutes of face to face time was spent with patient more than half of which was spent in counselling about the above mentioned conditions  and coordination of care   I have changed Mr. Peri Marislhussain's canagliflozin. I am also having him maintain his multivitamin, aspirin, glucose blood, Insulin Pen Needle, ONE TOUCH ULTRA TEST, B-D ULTRAFINE III SHORT PEN, lisinopril, atorvastatin, metFORMIN, cyclobenzaprine, famotidine, meloxicam, canagliflozin, and Insulin Glargine.  Meds ordered this encounter  Medications  . canagliflozin (INVOKANA) 300 MG TABS tablet    Sig: Take 1 tablet (300 mg total) by mouth daily before breakfast.    Dispense:  30 tablet    Refill:  11    Dose  change  . Insulin Glargine (LANTUS SOLOSTAR) 100 UNIT/ML Solostar Pen    Sig: Inject 35 Units into the skin daily at 10 pm.    Dispense:  15 mL    Refill:  5    Medications Discontinued During This Encounter  Medication Reason  . canagliflozin (INVOKANA) 300 MG TABS tablet Reorder  . Insulin Glargine (LANTUS SOLOSTAR) 100 UNIT/ML Solostar Pen Reorder    Follow-up: Return in about 3 months (around 05/19/2016) for follow up diabetes.   Sherlene ShamsULLO, Carrson Lightcap L, MD

## 2016-02-17 NOTE — Assessment & Plan Note (Signed)
Well controlled on current regimen. Renal function stable, no changes today.  Lab Results  Component Value Date   CREATININE 0.67 09/03/2015   Lab Results  Component Value Date   NA 139 09/03/2015   K 3.9 09/03/2015   CL 107 09/03/2015   CO2 27 09/03/2015

## 2016-02-17 NOTE — Assessment & Plan Note (Signed)
Now taking Lantus, Invokanna and metformin with reports of improved control.  [facility] Patient outreach has been helpful in reminding patient to keep appointments for diabetes follow up

## 2016-02-17 NOTE — Progress Notes (Signed)
Pre-visit discussion using our clinic review tool. No additional management support is needed unless otherwise documented below in the visit note.  

## 2016-02-17 NOTE — Patient Instructions (Addendum)
I will see you in 3 months unless your A1c is at goal this time (goal is < 7.0)  Basic Carbohydrate Counting for Diabetes Mellitus Carbohydrate counting is a method for keeping track of the amount of carbohydrates you eat. Eating carbohydrates naturally increases the level of sugar (glucose) in your blood, so it is important for you to know the amount that is okay for you to have in every meal. Carbohydrate counting helps keep the level of glucose in your blood within normal limits. The amount of carbohydrates allowed is different for every person. A dietitian can help you calculate the amount that is right for you. Once you know the amount of carbohydrates you can have, you can count the carbohydrates in the foods you want to eat. Carbohydrates are found in the following foods:  Grains, such as breads and cereals.  Dried beans and soy products.  Starchy vegetables, such as potatoes, peas, and corn.  Fruit and fruit juices.  Milk and yogurt.  Sweets and snack foods, such as cake, cookies, candy, chips, soft drinks, and fruit drinks. CARBOHYDRATE COUNTING There are two ways to count the carbohydrates in your food. You can use either of the methods or a combination of both. Reading the "Nutrition Facts" on Packaged Food The "Nutrition Facts" is an area that is included on the labels of almost all packaged food and beverages in the [place]. It includes the serving size of that food or beverage and information about the nutrients in each serving of the food, including the grams (g) of carbohydrate per serving.  Decide the number of servings of this food or beverage that you will be able to eat or drink. Multiply that number of servings by the number of grams of carbohydrate that is listed on the label for that serving. The total will be the amount of carbohydrates you will be having when you eat or drink this food or beverage. Learning Standard Serving Sizes of Food When you eat food that is  not packaged or does not include "Nutrition Facts" on the label, you need to measure the servings in order to count the amount of carbohydrates.A serving of most carbohydrate-rich foods contains about 15 g of carbohydrates. The following list includes serving sizes of carbohydrate-rich foods that provide 15 g ofcarbohydrate per serving:   1 slice of bread (1 oz) or 1 six-inch tortilla.    of a hamburger bun or English muffin.  4-6 crackers.   cup unsweetened dry cereal.    cup hot cereal.   cup rice or pasta.    cup mashed potatoes or  of a large baked potato.  1 cup fresh fruit or one small piece of fruit.    cup canned or frozen fruit or fruit juice.  1 cup milk.   cup plain fat-free yogurt or yogurt sweetened with artificial sweeteners.   cup cooked dried beans or starchy vegetable, such as peas, corn, or potatoes.  Decide the number of standard-size servings that you will eat. Multiply that number of servings by 15 (the grams of carbohydrates in that serving). For example, if you eat 2 cups of strawberries, you will have eaten 2 servings and 30 g of carbohydrates (2 servings x 15 g = 30 g). For foods such as soups and casseroles, in which more than one food is mixed in, you will need to count the carbohydrates in each food that is included. EXAMPLE OF CARBOHYDRATE COUNTING Sample Dinner  3 oz chicken breast.  cup of brown rice.   cup of corn.  1 cup milk.   1 cup strawberries with sugar-free whipped topping.  Carbohydrate Calculation Step 1: Identify the foods that contain carbohydrates:   Rice.   Corn.   Milk.   Strawberries. Step 2:Calculate the number of servings eaten of each:   2 servings of rice.   1 serving of corn.   1 serving of milk.   1 serving of strawberries. Step 3: Multiply each of those number of servings by 15 g:   2 servings of rice x 15 g = 30 g.   1 serving of corn x 15 g = 15 g.   1 serving of milk  x 15 g = 15 g.   1 serving of strawberries x 15 g = 15 g. Step 4: Add together all of the amounts to find the total grams of carbohydrates eaten: 30 g + 15 g + 15 g + 15 g = 75 g.   This information is not intended to replace advice given to you by your health care provider. Make sure you discuss any questions you have with your health care provider.   Document Released: 07/27/2005 Document Revised: 08/17/2014 Document Reviewed: 06/23/2013 Elsevier Interactive Patient Education [employer].

## 2016-02-17 NOTE — Patient Outreach (Signed)
[facility]) Care Management  02/17/2016  Randy Grant 08/18/1965 [id number]   Spoke to patient today.  He did not listen to a voice message I left him about a scheduled appointment today.  He saw Dr. Darrick Huntsmanullo this am and has had labs drawn.  I will follow up with him when he receives his A1C.    Susette RacerJulie Jadier Rockers, RN, BA, MHA, CDE [facility] Diabetes Coordinator- Link To [employer] Direct Dial:  [phone]  Fax:  [phone] E-mail: [email or web address] [address], [place], [place]  [address]

## 2016-02-19 NOTE — Assessment & Plan Note (Addendum)
Slight loss of control with current regiment complicated by observance of Ramadan.  Advised to increase the Lantus to 30 units and avoid skipping meals .  Lab Results  Component Value Date   HGBA1C 7.8* 02/17/2016   Lab Results  Component Value Date   MICROALBUR 1.2 02/17/2016

## 2016-02-24 ENCOUNTER — Other Ambulatory Visit: Payer: Self-pay

## 2016-02-24 NOTE — Patient Outreach (Signed)
[facility]) Care Management  02/24/2016  Mancel Parsonslamin E Schaber 09/04/1965 [id number]   Spoke to Malachi briefly- hae was unable to speak at the time- he had to return my call at a later time.  He has left a message for me to return his call.  I will follow up later this week.    Susette RacerJulie Winner Valeriano, RN, BA, MHA, CDE [facility] Diabetes Coordinator- Link To [employer] Direct Dial:  [phone]  Fax:  [phone] E-mail: [email or web address] [address], [place], [place]  [address]

## 2016-02-25 ENCOUNTER — Encounter: Payer: Self-pay | Admitting: *Deleted

## 2016-02-28 ENCOUNTER — Other Ambulatory Visit: Payer: Self-pay

## 2016-02-28 NOTE — Patient Outreach (Signed)
[facility]N) Care Management  02/28/2016  Randy Grant 01/31/1966 [id number]   Called Starling to schedule an appointment- he did not answer and I left a message.  He saw Dr. Darrick Huntsmanullo on 02/17/16 and his A1C was 7.8%.  I have left him a tentative date of 03/23/16 at 2pm to follow up with me- I have asked him to confirm.    Susette RacerJulie Bethenny Losee, RN, BA, MHA, CDE [facility] Diabetes Coordinator- Link To [employer] Direct Dial:  [phone]  Fax:  [phone] E-mail: [email or web address] [address], [place], [place]  [address]

## 2016-03-04 NOTE — Patient Outreach (Signed)
[facility]) Care Management  03/04/2016  TREMIR SIEGMANN 10-09-1965 [id number]   Letter sent to patient for scheduled visit- if he does not show, he will be cancelled from the program.   [name], RN, BA, MHA, CDE [facility] Diabetes Coordinator- Link To Wellness Direct Dial:  [phone]  Fax:  [phone] E-mail: [name] [address], [place], [place]  [address]

## 2016-03-23 ENCOUNTER — Other Ambulatory Visit: Payer: Self-pay

## 2016-03-23 VITALS — BP 138/78 | HR 71 | Ht 67.0 in | Wt 155.4 lb

## 2016-03-23 NOTE — Patient Outreach (Signed)
[facility]) Care Management  [facility]  03/23/2016   Randy Grant 11/11/1965 [id number]  Subjective: Patient in for Link to Wellness visit. He tells me his A1C is up as a result of Ramadan (higher intake) and some recent stress.  He is not exercising and is routinely working 12 hour shifts- he complains of being tired and having little time to himself.  He has only checked sugars 3 times in the past 14 days- his average blood sugar is 90mg /dl.  He denies hypoglycemia even during Ramadan.  He has no complaints of pain, denies depression.    Objective:  Vitals:   03/23/16 1424  BP: 138/78  Pulse: 71  SpO2: 98%  Weight: 155 lb 6.4 oz (70.5 kg)  Height: 1.702 m (5\' 7" )     Encounter Medications:  Outpatient Encounter Prescriptions as of 03/23/2016  Medication Sig  . aspirin 81 MG tablet Take 81 mg by mouth daily.    Marland Kitchen. atorvastatin (LIPITOR) 80 MG tablet TAKE 1 TABLET BY MOUTH DAILY  . B-D ULTRAFINE III SHORT PEN 31G X 8 MM MISC TEST BLOOD SUGARS 2 TIMES DAILY  . canagliflozin (INVOKANA) 300 MG TABS tablet Take 1 tablet (300 mg total) by mouth daily before breakfast.  . glucose blood (ONE TOUCH ULTRA TEST) test strip Use as instructed three times daily to check blood sugars  . Insulin Glargine (LANTUS SOLOSTAR) 100 UNIT/ML Solostar Pen Inject 35 Units into the skin daily at 10 pm.  . Insulin Pen Needle (B-D ULTRAFINE III SHORT PEN) 31G X 8 MM MISC Test blood sugars 2 times daily  . lisinopril (PRINIVIL,ZESTRIL) 10 MG tablet TAKE 1 TABLET BY MOUTH DAILY.  . Multiple Vitamin (MULTIVITAMIN) tablet Take 1 tablet by mouth daily.    . ONE TOUCH ULTRA TEST test strip USE AS INSTRUCTED  . canagliflozin (INVOKANA) 300 MG TABS tablet Take 1 tablet (300 mg total) by mouth daily before breakfast. (Patient not taking: Reported on 03/23/2016)  . cyclobenzaprine (FLEXERIL) 10 MG tablet Take 1 tablet (10 mg total) by mouth 3 (three) times daily as needed for muscle spasms.  (Patient not taking: Reported on 02/17/2016)  . famotidine (PEPCID) 20 MG tablet Take 1 tablet (20 mg total) by mouth 2 (two) times daily. For reflux/indigestion (Patient not taking: Reported on 02/17/2016)  . meloxicam (MOBIC) 15 MG tablet Take 15 mg by mouth daily. Reported on 02/17/2016  . metFORMIN (GLUCOPHAGE-XR) 500 MG 24 hr tablet Take 1 tablet (500 mg total) by mouth 2 (two) times daily.   No facility-administered encounter medications on file as of 03/23/2016.     Functional Status:  In your present state of health, do you have any difficulty performing the following activities: 03/23/2016 07/15/2015  Hearing? N N  Vision? N N  Difficulty concentrating or making decisions? N N  Walking or climbing stairs? N N  Dressing or bathing? N N  Doing errands, shopping? N N  Some recent data might be hidden    Fall/Depression Screening: PHQ 2/9 Scores 03/23/2016 11/04/2015 07/15/2015 04/08/2015  PHQ - 2 Score 0 0 0 0    Assessment: Taking medicationss as ordered, currently 35 units Lantus without hypoglycemia.  His is rarely taking blood sugars and has stopped exercising.    Plan: Goal is to get A1C near 7%Fredia Beets- Ihave asked him to check sugars 2 hours after meals x 7 days to see if he's having post prandial exertions. Attempt to get in exercise 1x/week at an enjoyable  time with his children (soccer, walk, @ the park)   [facility] CM Care Plan Problem Two   Flowsheet Row Most Recent Value  Care Plan Problem Two  A1C remains above goal of 7%  Role Documenting the Problem Two  Care Management Coordinator  Care Plan for Problem Two  Active  Interventions for Problem Two Long Term Goal   Reviewed the importance of exercise and stress management and checking blood sugars.  Stressed the need for a preventative care including a dilated eye exam.   [facility] Long Term Goal (31-90) days  A1C will be at or less than 7% when it is checked by MD in the next 3 months.  [facility] Long Term Goal Start Date  03/23/16

## 2016-04-27 ENCOUNTER — Other Ambulatory Visit: Payer: Self-pay

## 2016-04-27 NOTE — Patient Outreach (Addendum)
[facility]) Care Management  04/27/2016  Randy Grant 06/18/1966 [id number]   Spoke to patient by phone today.  He tells me he in fact did check his blood sugar 2x/day for 2 weeks but "I forgot to bring you my records".  He tells me fasting blood sugars were 90-102mg /dl  but his 2 hour after meal blood sugar got as high as 235mg /dl.  He reports having 1 low blood sugar where sugar went to 68mg /dl (after he woke up). He has not scheduled with Dr. Darrick Huntsmanullo or scheduled a dilated eye exam although he does tell me that now he has the go ahead financially to schedule the dilate eye. He is not exercising outside of work.  I will follow up with him next month.   Susette RacerJulie Walaa Carel, RN, BA, MHA, CDE [facility] Diabetes Coordinator- Link To [employer] Direct Dial:  [phone]  Fax:  [phone] E-mail: [email or web address] [address], [place], [place]  [address]

## 2016-05-22 ENCOUNTER — Other Ambulatory Visit: Payer: Self-pay | Admitting: Internal Medicine

## 2016-06-01 ENCOUNTER — Other Ambulatory Visit: Payer: Self-pay

## 2016-06-01 NOTE — Patient Outreach (Signed)
[facility]) Care Management  06/01/2016  Randy Grant 10/07/1965 [id number]   I have left a message with [name] to register on the Link to [employer] [employer] website and schedule an appointment with Dr. Darrick Huntsmanullo then return my call all by November 1st.   Susette RacerJulie Calista Crain, RN, BA, MHA, CDE [facility] Diabetes Coordinator- Link To Wellness Direct Dial:  [phone]  Fax:  [phone] E-mail: [email or web address] [address], [place], [place]  [address]

## 2016-06-12 NOTE — Patient Outreach (Signed)
[facility]) Care Management  06/12/2016  Randy Grant 01/08/1966 [id number]   Missed appointment letter sent to patient.   Susette RacerJulie Aldean Pipe, RN, BA, MHA, CDE [facility] Diabetes Coordinator- Link To [employer] Direct Dial:  [phone]  Fax:  [phone] E-mail: [email or web address] [address], [place], [place]  [address]

## 2016-06-19 ENCOUNTER — Other Ambulatory Visit: Payer: Self-pay

## 2016-06-19 NOTE — Patient Outreach (Signed)
[facility]) Care Management  06/19/2016  Randy Grant 12/14/1965 [id number]   I have returned [name] call and asked him to see me on 06/23/16 at 10:30.  I have asked him to confirm this appointment.   Susette RacerJulie Berlin Mokry, RN, BA, MHA, CDE Diabetes Coordinator Inpatient Diabetes Program  [phone] (Team Pager) [phone] [facility] Office) 06/19/2016 2:27 PM

## 2016-06-19 NOTE — Patient Outreach (Signed)
[facility]) Care Management  06/19/2016  Randy Grant 02/25/1966 [id number]   [name] left me a voice message on June 17, 2016 requesting an appointment with me and notifying me that he has scheduled an appointment with his MD.   Susette RacerJulie Jamya Starry, RN, BA, MHA, CDE [facility] Diabetes Coordinator- Link To Wellness Direct Dial:  [phone]  Fax:  [phone] E-mail: [email or web address] [address], [place], [place]  [address]

## 2016-06-22 ENCOUNTER — Other Ambulatory Visit: Payer: Self-pay | Admitting: Internal Medicine

## 2016-06-23 ENCOUNTER — Other Ambulatory Visit: Payer: Self-pay

## 2016-06-23 VITALS — BP 124/68 | Ht 67.0 in | Wt 155.9 lb

## 2016-06-23 DIAGNOSIS — E1165 Type 2 diabetes mellitus with hyperglycemia: Secondary | ICD-10-CM

## 2016-06-23 DIAGNOSIS — Z794 Long term (current) use of insulin: Secondary | ICD-10-CM

## 2016-06-23 NOTE — Patient Outreach (Signed)
[facility]) Care Management  [facility]  06/23/2016   Randy Grant 09/26/1965 [id number]  Subjective: Patient in for his Link to Wellness visit. He brought his meter with him and has had 3 low blood sugars over the past week- this morning he was awakened with feeling hungry and shaky and his blood sugar was 44 mg/dl. He carries candies with him while he's at work.  If he "feels a low blood sugar coming on", he'll eat a snack or meal.     His CBG was 208mg /dl at 7:82NF4:08am , he took his Lantus and Invokana and went to bed- he awoke at 6:49am with a blood sugar of 44mg /dl.  He says he ate chicken tenders, salad and bread at 11pm but had a very busy night at work.  His blood sugars have been as high as 191,218,173, 136 and 142 and lower numbers have been 78,83,74,44mg /dl.   He raised concerns about Invokana. He has questions about restarting Novolog- he will discuss with you at his next visit.   Objective:  Vitals:   06/23/16 1252  BP: 124/68     Encounter Medications:  Outpatient Encounter Prescriptions as of 06/23/2016  Medication Sig  . aspirin 81 MG tablet Take 81 mg by mouth daily.    Marland Kitchen. atorvastatin (LIPITOR) 80 MG tablet TAKE 1 TABLET BY MOUTH DAILY  . B-D ULTRAFINE III SHORT PEN 31G X 8 MM MISC TEST BLOOD SUGARS 2 TIMES DAILY  . BAYER CONTOUR NEXT TEST test strip USE AS INSTRUCTED  . canagliflozin (INVOKANA) 300 MG TABS tablet Take 1 tablet (300 mg total) by mouth daily before breakfast.  . Insulin Glargine (LANTUS SOLOSTAR) 100 UNIT/ML Solostar Pen Inject 35 Units into the skin daily at 10 pm.  . Insulin Pen Needle (B-D ULTRAFINE III SHORT PEN) 31G X 8 MM MISC Test blood sugars 2 times daily  . lisinopril (PRINIVIL,ZESTRIL) 10 MG tablet TAKE 1 TABLET BY MOUTH DAILY  . metFORMIN (GLUCOPHAGE-XR) 500 MG 24 hr tablet Take 1 tablet (500 mg total) by mouth 2 (two) times daily.  . canagliflozin (INVOKANA) 300 MG TABS tablet Take 1 tablet (300 mg total) by  mouth daily before breakfast. (Patient not taking: Reported on 06/23/2016)  . cyclobenzaprine (FLEXERIL) 10 MG tablet Take 1 tablet (10 mg total) by mouth 3 (three) times daily as needed for muscle spasms. (Patient not taking: Reported on 06/23/2016)  . famotidine (PEPCID) 20 MG tablet Take 1 tablet (20 mg total) by mouth 2 (two) times daily. For reflux/indigestion (Patient not taking: Reported on 06/23/2016)  . glucose blood (ONE TOUCH ULTRA TEST) test strip Use as instructed three times daily to check blood sugars (Patient not taking: Reported on 06/23/2016)  . meloxicam (MOBIC) 15 MG tablet Take 15 mg by mouth daily. Reported on 02/17/2016  . Multiple Vitamin (MULTIVITAMIN) tablet Take 1 tablet by mouth daily.     No facility-administered encounter medications on file as of 06/23/2016.     Functional Status:  In your present state of health, do you have any difficulty performing the following activities: 06/23/2016 03/23/2016  Hearing? N N  Vision? N N  Difficulty concentrating or making decisions? N N  Walking or climbing stairs? N N  Dressing or bathing? N N  Doing errands, shopping? N N  Some recent data might be hidden    Fall/Depression Screening: PHQ 2/9 Scores 06/23/2016 03/23/2016 11/04/2015 07/15/2015 04/08/2015  PHQ - 2 Score 0 0 0 0 0  Assessment: Reviewed the treatment of low blood sugar, heart attack symptoms, stroke symptoms and potential side effects of Invokana. We reviewed meal planning again and the need for a dilated eye exam.    Plan:  [facility] Care [facility] Problem Two   Flowsheet Row Most Recent Value  Care Plan Problem Two  A1C remains above goal of 7%  Role Documenting the Problem Two  Care Management Coordinator  Care Plan for Problem Two  Active  Interventions for Problem Two Long Term Goal   reviewed the treatment of hypoglycemia, the potential side effects of all his diabetes medications, and the impact of stress on blood sugars ([place] sick in [place])  [facility]  Long Term Goal (31-90) days  A1C will be at or less than 7% when it is checked by MD in the next 3 months.  [facility] Long Term Goal Start Date  04/27/16    [facility] Care Plan Problem Three   Flowsheet Row Most Recent Value  Care Plan Problem Three  Patient is having a few low blood sugars a week  Role Documenting the Problem Three  Care Management Coordinator  Care Plan for Problem Three  Active  [facility] Long Term Goal (31-90) days  Patient will decrease the number of hypoglycemia event to 0 per week  [facility] Long Term Goal Start Date  06/23/16  Interventions for Problem Three Long Term Goal  Reviewed treatment of low blood sugars 2. encouraged to check blood sugars daily and take the meter to Dr. Melina Schoolsullo's appointment 3. will discuss medications with MD at next visit  4. Will keep 4 candies and 1 pkg of peantu butter crackers with him at all times.       Susette RacerJulie Yliana Gravois, RN, BA, MHA, CDE [facility] Diabetes Coordinator- [facility] To [facility] Direct Dial:  [phone]  Fax:  [phone] E-mail: [email or web address] [address], [place], [place]  [phone]

## 2016-07-06 ENCOUNTER — Ambulatory Visit (INDEPENDENT_AMBULATORY_CARE_PROVIDER_SITE_OTHER): Payer: 59 | Admitting: Internal Medicine

## 2016-07-06 ENCOUNTER — Encounter: Payer: Self-pay | Admitting: Internal Medicine

## 2016-07-06 VITALS — BP 128/68 | HR 92 | Temp 98.1°F | Resp 12 | Ht 67.0 in | Wt 154.0 lb

## 2016-07-06 DIAGNOSIS — E7800 Pure hypercholesterolemia, unspecified: Secondary | ICD-10-CM

## 2016-07-06 DIAGNOSIS — IMO0002 Reserved for concepts with insufficient information to code with codable children: Secondary | ICD-10-CM

## 2016-07-06 DIAGNOSIS — I10 Essential (primary) hypertension: Secondary | ICD-10-CM

## 2016-07-06 DIAGNOSIS — E1365 Other specified diabetes mellitus with hyperglycemia: Secondary | ICD-10-CM

## 2016-07-06 DIAGNOSIS — E133299 Other specified diabetes mellitus with mild nonproliferative diabetic retinopathy without macular edema, unspecified eye: Secondary | ICD-10-CM

## 2016-07-06 MED ORDER — EMPAGLIFLOZIN 25 MG PO TABS: 25.0000 mg | ORAL_TABLET | Freq: Every day | ORAL | 5 refills | Status: DC

## 2016-07-06 NOTE — Progress Notes (Signed)
Subjective:  Patient ID: Randy Grant, male    DOB: 10/30/1965  Age: 50 y.o. 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 Diagnoses of HYPERTENSION, BENIGN ESSENTIAL and Pure hypercholesterolemia were also pertinent to this visit.  HPI Randy Grant presents for 3 month follow up on diabetes.  Patient has no complaints today.  Patient is following a low glycemic index diet and taking all prescribed medications regularly BUT HAS CONCERNS ABOUT SIDE EFFECTS of new medication Invokanna. .  Has had dry mouth every night which is disrupting his sleep. .  Etc  Discussed changing to jardiance  Works from 4 pm to 4 am  .  checks sugar toward the end of his shift at 2 am and BS is always high around 200  fastings are 85 to 100    Novolog was stopped when a1c dropped below 8.0.  Takes Lantus before bedtime.  invokanna at 1pm whe he wakes up. metformin 500 mg twice daily.     Patient has NOT an eye exam in the last 12 months and checks feet regularly for signs of infection.  Patient does not walk barefoot outside,  And denies an numbness tingling or burning in feet. Patient is DUE FOR TETANUS VACCINATION  Lab Results  Component Value Date   HGBA1C 7.2 (H) 07/06/2016   Lab Results  Component Value Date   MICROALBUR 1.2 02/17/2016     Outpatient Medications Prior to Visit  Medication Sig Dispense Refill  . aspirin 81 MG tablet Take 81 mg by mouth daily.      Marland Kitchen. atorvastatin (LIPITOR) 80 MG tablet TAKE 1 TABLET BY MOUTH DAILY 90 tablet 1  . B-D ULTRAFINE III SHORT PEN 31G X 8 MM MISC TEST BLOOD SUGARS 2 TIMES DAILY 100 each 5  . BAYER CONTOUR NEXT TEST test strip USE AS INSTRUCTED 100 each 5  . canagliflozin (INVOKANA) 300 MG TABS tablet Take 1 tablet (300 mg total) by mouth daily before breakfast. 30 tablet 0  . canagliflozin (INVOKANA) 300 MG TABS tablet Take 1  tablet (300 mg total) by mouth daily before breakfast. 30 tablet 11  . cyclobenzaprine (FLEXERIL) 10 MG tablet Take 1 tablet (10 mg total) by mouth 3 (three) times daily as needed for muscle spasms. 30 tablet 1  . glucose blood (ONE TOUCH ULTRA TEST) test strip Use as instructed three times daily to check blood sugars 100 each 12  . Insulin Glargine (LANTUS SOLOSTAR) 100 UNIT/ML Solostar Pen Inject 35 Units into the skin daily at 10 pm. 15 mL 5  . Insulin Pen Needle (B-D ULTRAFINE III SHORT PEN) 31G X 8 MM MISC Test blood sugars 2 times daily 100 each 5  . lisinopril (PRINIVIL,ZESTRIL) 10 MG tablet TAKE 1 TABLET BY MOUTH DAILY 90 tablet 1  . metFORMIN (GLUCOPHAGE-XR) 500 MG 24 hr tablet Take 1 tablet (500 mg total) by mouth 2 (two) times daily. 360 tablet 11  . Multiple Vitamin (MULTIVITAMIN) tablet Take 1 tablet by mouth daily.      . famotidine (PEPCID) 20 MG tablet Take 1 tablet (20 mg total) by mouth 2 (two) times daily. For reflux/indigestion (Patient not taking: Reported on 07/06/2016) 60 tablet 2  . meloxicam (MOBIC) 15 MG tablet Take 15 mg by mouth daily. Reported on 02/17/2016     No facility-administered medications prior to visit.     Review of Systems;  Patient denies headache, fevers, malaise, unintentional weight loss, skin rash, eye pain, sinus congestion and sinus pain, sore throat, dysphagia,  hemoptysis , cough, dyspnea, wheezing, chest pain, palpitations, orthopnea, edema, abdominal pain, nausea, melena, diarrhea, constipation, flank pain, dysuria, hematuria, urinary  Frequency, nocturia, numbness, tingling, seizures,  Focal weakness, Loss of consciousness,  Tremor, insomnia, depression, anxiety, and suicidal ideation.      Objective:  BP 128/68   Pulse 92   Temp 98.1 F (36.7 C) (Oral)   Resp 12   Ht 5\' 7"  (1.702 m)   Wt 154 lb (69.9 kg)   SpO2 98%   BMI 24.12 kg/m   BP Readings from Last 3 Encounters:  07/06/16 128/68  06/23/16 124/68  03/23/16 138/78    Wt  Readings from Last 3 Encounters:  07/06/16 154 lb (69.9 kg)  06/23/16 155 lb 14.4 oz (70.7 kg)  03/23/16 155 lb 6.4 oz (70.5 kg)    General appearance: alert, cooperative and appears stated age Ears: normal TM's and external ear canals both ears Throat: lips, mucosa, and tongue normal; teeth and gums normal Neck: no adenopathy, no carotid bruit, supple, symmetrical, trachea midline and thyroid not enlarged, symmetric, no tenderness/mass/nodules Back: symmetric, no curvature. ROM normal. No CVA tenderness. Lungs: clear to auscultation bilaterally Heart: regular rate and rhythm, S1, S2 normal, no murmur, click, rub or gallop Abdomen: soft, non-tender; bowel sounds normal; no masses,  no organomegaly Pulses: 2+ and symmetric Skin: Skin color, texture, turgor normal. No rashes or lesions Lymph nodes: Cervical, supraclavicular, and axillary nodes normal.  Lab Results  Component Value Date   HGBA1C 7.2 (H) 07/06/2016   HGBA1C 7.8 (H) 02/17/2016   HGBA1C 7.6 (H) 09/03/2015    Lab Results  Component Value Date   CREATININE 0.88 07/06/2016   CREATININE 0.76 02/17/2016   CREATININE 0.67 09/03/2015    Lab Results  Component Value Date   GLUCOSE 200 (H) 07/06/2016   CHOL 210 (H) 02/17/2016   TRIG 138.0 02/17/2016   HDL 43.30 02/17/2016   LDLDIRECT 143.0 02/17/2016   LDLCALC 139 (H) 02/17/2016   ALT 23 07/06/2016   AST 19 07/06/2016   NA 137 07/06/2016   K 4.6 07/06/2016   CL 101 07/06/2016   CREATININE 0.88 07/06/2016   BUN 14 07/06/2016   CO2 28 07/06/2016   PSA 0.65 02/17/2016   HGBA1C 7.2 (H) 07/06/2016   MICROALBUR 1.2 02/17/2016    Dg Hip Unilat With Pelvis 2-3 Views Right  Result Date: 10/21/2015 CLINICAL DATA:  Acute right hip pain. EXAM: DG HIP (WITH OR WITHOUT PELVIS) 2-3V RIGHT COMPARISON:  None. FINDINGS: There is no evidence of hip fracture or dislocation. There is no evidence of arthropathy or other focal bone abnormality. IMPRESSION: Normal right hip.  Electronically Signed   By: Lupita RaiderJames  Green Jr, M.D.   On: 10/21/2015 16:16    Assessment & Plan:   Problem List Items Addressed This Visit    Hyperlipidemia    He is on maximal dose of atorvastatin. LFTs are normal.   Lab Results  Component Value Date   CHOL 210 (H) 02/17/2016   HDL 43.30 02/17/2016   LDLCALC 139 (H) 02/17/2016   LDLDIRECT 143.0 02/17/2016   TRIG 138.0 02/17/2016   CHOLHDL 5 02/17/2016    Lab Results  Component Value Date   ALT 23 07/06/2016   AST 19 07/06/2016   ALKPHOS 67 07/06/2016   BILITOT 0.4 07/06/2016  HYPERTENSION, BENIGN ESSENTIAL    Well controlled on current regimen. Renal function stable, no changes today.  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  Changing to Jardiance.strongly advised to schedule his eye appointment.  Continue lantus and metormin Lab Results  Component Value Date   HGBA1C 7.2 (H) 07/06/2016   Lab Results  Component Value Date   MICROALBUR 1.2 02/17/2016         Relevant Medications   empagliflozin (JARDIANCE) 25 MG TABS tablet   Other Relevant Orders   Comprehensive metabolic panel (Completed)   Hemoglobin A1c (Completed)   Hemoglobin A1c   Comprehensive metabolic panel   Lipid panel   LDL cholesterol, direct      I am having Mr. Deharo start on empagliflozin. I am also having him maintain his multivitamin, aspirin, glucose blood, Insulin Pen Needle, B-D ULTRAFINE III SHORT PEN, metFORMIN, cyclobenzaprine, famotidine, meloxicam, canagliflozin, canagliflozin, Insulin Glargine, BAYER CONTOUR NEXT TEST,  atorvastatin, and lisinopril.  Meds ordered this encounter  Medications  . empagliflozin (JARDIANCE) 25 MG TABS tablet    Sig: Take 25 mg by mouth daily.    Dispense:  30 tablet    Refill:  5    There are no discontinued medications.  Follow-up: Return in about 3 months (around 10/06/2016), or labs 90+ days fronm today first .   Sherlene Shams, MD

## 2016-07-06 NOTE — Patient Instructions (Signed)
I am recommending that you stop the Invokanna and start Jardiance instead because it protects your heart as well   Similar medication,  Taken once daily

## 2016-07-06 NOTE — Progress Notes (Signed)
Pre-visit discussion using our clinic review tool. No additional management support is needed unless otherwise documented below in the visit note.  

## 2016-07-07 LAB — COMPREHENSIVE METABOLIC PANEL
ALBUMIN: 4.1 g/dL (ref 3.5–5.2)
ALK PHOS: 67 U/L (ref 39–117)
ALT: 23 U/L (ref 0–53)
AST: 19 U/L (ref 0–37)
BUN: 14 mg/dL (ref 6–23)
CALCIUM: 9.1 mg/dL (ref 8.4–10.5)
CHLORIDE: 101 meq/L (ref 96–112)
CO2: 28 mEq/L (ref 19–32)
CREATININE: 0.88 mg/dL (ref 0.40–1.50)
GFR: 96.54 mL/min (ref >60.00)
Glucose, Bld: 200 mg/dL — ABNORMAL HIGH (ref 70–99)
POTASSIUM: 4.6 meq/L (ref 3.5–5.1)
SODIUM: 137 meq/L (ref 135–145)
TOTAL PROTEIN: 6.9 g/dL (ref 6.0–8.3)
Total Bilirubin: 0.4 mg/dL (ref 0.2–1.2)

## 2016-07-07 LAB — HEMOGLOBIN A1C: HEMOGLOBIN A1C: 7.2 % — AB (ref 4.6–6.5)

## 2016-07-07 NOTE — Assessment & Plan Note (Signed)
He is on maximal dose of atorvastatin. LFTs are normal.   Lab Results  Component Value Date   CHOL 210 (H) 02/17/2016   HDL 43.30 02/17/2016   LDLCALC 139 (H) 02/17/2016   LDLDIRECT 143.0 02/17/2016   TRIG 138.0 02/17/2016   CHOLHDL 5 02/17/2016    Lab Results  Component Value Date   ALT 23 07/06/2016   AST 19 07/06/2016   ALKPHOS 67 07/06/2016   BILITOT 0.4 07/06/2016

## 2016-07-07 NOTE — Assessment & Plan Note (Signed)
Improved  control with current regimen, but patient concerned about s/e and safety profile of Invokanna.  Changing to Jardiance.strongly advised to schedule his eye appointment.  Continue lantus and metormin Lab Results  Component Value Date   HGBA1C 7.2 (H) 07/06/2016   Lab Results  Component Value Date   MICROALBUR 1.2 02/17/2016

## 2016-07-07 NOTE — Assessment & Plan Note (Signed)
Well controlled on current regimen. Renal function stable, no changes today.  Lab Results  Component Value Date   CREATININE 0.88 07/06/2016   Lab Results  Component Value Date   NA 137 07/06/2016   K 4.6 07/06/2016   CL 101 07/06/2016   CO2 28 07/06/2016

## 2016-07-09 ENCOUNTER — Encounter: Payer: Self-pay | Admitting: *Deleted

## 2016-08-17 ENCOUNTER — Telehealth: Payer: Self-pay | Admitting: Internal Medicine

## 2016-08-17 NOTE — Telephone Encounter (Signed)
Pharmacy faxed over needed Prior authorization for Jardiance scompleted prior authorization today.

## 2016-08-19 NOTE — Telephone Encounter (Signed)
Pa approval received for Jardiance from 08/14/16 til 08/13/17

## 2016-09-23 ENCOUNTER — Other Ambulatory Visit: Payer: Self-pay | Admitting: Internal Medicine

## 2016-10-19 ENCOUNTER — Other Ambulatory Visit: Payer: 59

## 2016-10-19 NOTE — Addendum Note (Signed)
Addended by: Warden FillersWRIGHT, Felita Bump S on: 10/19/2016 12:12 PM   Modules accepted: Orders

## 2016-10-19 NOTE — Addendum Note (Signed)
Addended by: Warden FillersWRIGHT, LATOYA S on: 10/19/2016 11:31 AM   Modules accepted: Orders

## 2016-10-21 ENCOUNTER — Ambulatory Visit: Payer: 59 | Admitting: Internal Medicine

## 2016-11-20 ENCOUNTER — Other Ambulatory Visit: Payer: Self-pay | Admitting: Internal Medicine

## 2016-12-17 ENCOUNTER — Encounter: Payer: Self-pay | Admitting: Internal Medicine

## 2016-12-17 ENCOUNTER — Ambulatory Visit (INDEPENDENT_AMBULATORY_CARE_PROVIDER_SITE_OTHER): Payer: 59 | Admitting: Internal Medicine

## 2016-12-17 DIAGNOSIS — I10 Essential (primary) hypertension: Secondary | ICD-10-CM | POA: Diagnosis not present

## 2016-12-17 DIAGNOSIS — E7800 Pure hypercholesterolemia, unspecified: Secondary | ICD-10-CM

## 2016-12-17 DIAGNOSIS — E1365 Other specified diabetes mellitus with hyperglycemia: Secondary | ICD-10-CM | POA: Diagnosis not present

## 2016-12-17 DIAGNOSIS — IMO0002 Reserved for concepts with insufficient information to code with codable children: Secondary | ICD-10-CM

## 2016-12-17 DIAGNOSIS — E133299 Other specified diabetes mellitus with mild nonproliferative diabetic retinopathy without macular edema, unspecified eye: Secondary | ICD-10-CM | POA: Diagnosis not present

## 2016-12-17 DIAGNOSIS — K5903 Drug induced constipation: Secondary | ICD-10-CM

## 2016-12-17 LAB — POCT GLYCOSYLATED HEMOGLOBIN (HGB A1C): Hemoglobin A1C: 7.2

## 2016-12-17 MED ORDER — DOCUSATE SODIUM 50 MG PO CAPS: 100.0000 mg | ORAL_CAPSULE | Freq: Every day | ORAL | 3 refills | Status: DC

## 2016-12-17 NOTE — Patient Instructions (Addendum)
During Ramadan:  Continue metformin and Continue jardiance   Suspend lantus for the first week,  Continue to check blood sugars once daily and resume lantus at 10 units if your sugars are > 150   I agree with your research that recommends exemption from fasting if you   Blood sugars < 70 or > 185  Dry mouth may be from sleeping with your mouth open.  Try using a lycra chin strap ("sleep angel) when you sleep to keep your mouth closed,  Or sleep on your side   You should try a stool softener for your constipation  called Colace (docusate) 100 mg daily before bed . If this does not help,  Try Miralax,  citrucel, metamucil or Benefiber.  These are bulk forming laxatives that use fiber to increase the water content of your stool

## 2016-12-17 NOTE — Progress Notes (Signed)
Subjective:  Patient ID: Randy Grant, male    DOB: 10-16-1965  Age: 51 y.o. 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  HPI Randy Grant presents for  6 month follow up on diabetes.  Patient has no complaints today.  Patient is following a modified low glycemic index diet and taking all prescribed medications regularly without side effects.Marland Kitchen  He is checking once daily. 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  Wakes up at 2 pm and checkis it at  3 pm,  fastings are under 100.   Enrolled in the wellness program  And communicates regularly with [name] via e mail    Fasting sugars have been under less than 100 most of the time, not checking  post prandials. Patient is  Walking 10,000 steps daily during the week and does not exercise on the weekend.  Patient has NOT had an eye exam in the last 12 months and checks feet regularly for signs of infection.  Patient does not walk barefoot outside,  And denies an numbness tingling or burning in feet. Patient is up to date on all recommended vaccinations  antici[ating TRamadn for one month stating may 17  Fasts for 15 hours .uses Lantus 35 units and metformin 500 mg daitwice dialy, Jardiacne once daily 25 mg ly   Foot exam normal  Eye exam recommended Reduction of medication s  During  Ramadan discussed,  Advised to suspend lantus and continue metfomrin an djardiance   Wakes up with dry mouth  3) constipation hard stools   Outpatient Medications Prior to Visit  Medication Sig Dispense Refill  . aspirin 81 MG tablet Take 81 mg by mouth daily.      Marland Kitchen atorvastatin (LIPITOR) 80 MG tablet TAKE 1 TABLET BY MOUTH DAILY 90 tablet 1  . B-D ULTRAFINE III SHORT PEN 31G X 8 MM MISC TEST BLOOD SUGARS 2 TIMES DAILY 100 each 6  .  BAYER CONTOUR NEXT TEST test strip USE AS INSTRUCTED 100 each 5  . BAYER MICROLET LANCETS lancets USE AS DIRECTED 100 each 5  . empagliflozin (JARDIANCE) 25 MG TABS tablet Take 25 mg by mouth daily. 30 tablet 5  . glucose blood (ONE TOUCH ULTRA TEST) test strip Use as instructed three times daily to check blood sugars 100 each 12  . Insulin Glargine (LANTUS SOLOSTAR) 100 UNIT/ML Solostar Pen Inject 35 Units into the skin daily at 10 pm. 15 mL 5  . Insulin Pen Needle (B-D ULTRAFINE III SHORT PEN) 31G X 8 MM MISC Test blood sugars 2 times daily 100 each 5  . lisinopril (PRINIVIL,ZESTRIL) 10 MG tablet TAKE 1 TABLET BY MOUTH DAILY 90 tablet 1  . metFORMIN (GLUCOPHAGE-XR) 500 MG 24 hr tablet TAKE 1 TABLET BY MOUTH 2 TIMES DAILY. 180 tablet 2  . Multiple Vitamin (MULTIVITAMIN) tablet Take 1 tablet by mouth daily.      . meloxicam (MOBIC) 15 MG tablet Take 15 mg by mouth daily. Reported on 02/17/2016    . canagliflozin (INVOKANA) 300 MG TABS tablet Take 1 tablet (300 mg total) by mouth daily before breakfast. (Patient not taking: Reported on 12/17/2016) 30 tablet 0  . canagliflozin (INVOKANA) 300 MG TABS tablet Take 1 tablet (300 mg total) by mouth daily before breakfast. (Patient not taking: Reported on 12/17/2016) 30 tablet 11  . cyclobenzaprine (  FLEXERIL) 10 MG tablet Take 1 tablet (10 mg total) by mouth 3 (three) times daily as needed for muscle spasms. (Patient not taking: Reported on 12/17/2016) 30 tablet 1  . famotidine (PEPCID) 20 MG tablet Take 1 tablet (20 mg total) by mouth 2 (two) times daily. For reflux/indigestion (Patient not taking: Reported on 07/06/2016) 60 tablet 2   No facility-administered medications prior to visit.     Review of Systems;  Patient denies headache, fevers, malaise, unintentional weight loss, skin rash, eye pain, sinus congestion and sinus pain, sore throat, dysphagia,  hemoptysis , cough, dyspnea, wheezing, chest pain, palpitations, orthopnea, edema, abdominal pain,  nausea, melena, diarrhea, constipation, flank pain, dysuria, hematuria, urinary  Frequency, nocturia, numbness, tingling, seizures,  Focal weakness, Loss of consciousness,  Tremor, insomnia, depression, anxiety, and suicidal ideation.      Objective:  BP 116/74 (BP Location: Left Arm, Patient Position: Sitting, Cuff Size: Normal)   Pulse 81   Temp 97.6 F (36.4 C) (Oral)   Resp 15   Ht 5\' 7"  (1.702 m)   Wt 152 lb 12.8 oz (69.3 kg)   SpO2 98%   BMI 23.93 kg/m   BP Readings from Last 3 Encounters:  12/17/16 116/74  07/06/16 128/68  06/23/16 124/68    Wt Readings from Last 3 Encounters:  12/17/16 152 lb 12.8 oz (69.3 kg)  07/06/16 154 lb (69.9 kg)  06/23/16 155 lb 14.4 oz (70.7 kg)    General appearance: alert, cooperative and appears stated age Ears: normal TM's and external ear canals both ears Throat: lips, mucosa, and tongue normal; teeth and gums normal Neck: no adenopathy, no carotid bruit, supple, symmetrical, trachea midline and thyroid not enlarged, symmetric, no tenderness/mass/nodules Back: symmetric, no curvature. ROM normal. No CVA tenderness. Lungs: clear to auscultation bilaterally Heart: regular rate and rhythm, S1, S2 normal, no murmur, click, rub or gallop Abdomen: soft, non-tender; bowel sounds normal; no masses,  no organomegaly Pulses: 2+ and symmetric Skin: Skin color, texture, turgor normal. No rashes or lesions Lymph nodes: Cervical, supraclavicular, and axillary nodes normal.  Lab Results  Component Value Date   HGBA1C 7.2 12/17/2016   HGBA1C 7.2 (H) 07/06/2016   HGBA1C 7.8 (H) 02/17/2016    Lab Results  Component Value Date   CREATININE 0.88 07/06/2016   CREATININE 0.76 02/17/2016   CREATININE 0.67 09/03/2015    Lab Results  Component Value Date   GLUCOSE 200 (H) 07/06/2016   CHOL 210 (H) 02/17/2016   TRIG 138.0 02/17/2016   HDL 43.30 02/17/2016   LDLDIRECT 143.0 02/17/2016   LDLCALC 139 (H) 02/17/2016   ALT 23 07/06/2016   AST 19  07/06/2016   NA 137 07/06/2016   K 4.6 07/06/2016   CL 101 07/06/2016   CREATININE 0.88 07/06/2016   BUN 14 07/06/2016   CO2 28 07/06/2016   PSA 0.65 02/17/2016   HGBA1C 7.2 12/17/2016   MICROALBUR 1.2 02/17/2016    Dg Hip Unilat With Pelvis 2-3 Views Right  Result Date: 10/21/2015 CLINICAL DATA:  Acute right hip pain. EXAM: DG HIP (WITH OR WITHOUT PELVIS) 2-3V RIGHT COMPARISON:  None. FINDINGS: There is no evidence of hip fracture or dislocation. There is no evidence of arthropathy or other focal bone abnormality. IMPRESSION: Normal right hip. Electronically Signed   By: Lupita RaiderJames  Green Jr, M.D.   On: 10/21/2015 16:16    Assessment & Plan:   Problem List Items Addressed This Visit    Uncontrolled maturity onset diabetes mellitus in young (MODY)  type 2 with mild nonproliferative retinopathy (HCC)    Improved  control with current regimen, but patient concerned about s/e and safety duirng Ramadan.  Advised to suspend insulin during Ramadan.  Lab Results  Component Value Date   HGBA1C 7.2 12/17/2016   Lab Results  Component Value Date   MICROALBUR 1.2 02/17/2016         Relevant Orders   POCT HgB A1C (Completed)   HYPERTENSION, BENIGN ESSENTIAL    Well controlled on current regimen. Renal function stable, no changes today.  Lab Results  Component Value Date   CREATININE 0.88 07/06/2016  . Lab Results  Component Value Date   NA 137 07/06/2016   K 4.6 07/06/2016   CL 101 07/06/2016   CO2 28 07/06/2016         Hyperlipidemia    He is on maximal dose of atorvastatin. LFTs are normal.   Lab Results  Component Value Date   CHOL 210 (H) 02/17/2016   HDL 43.30 02/17/2016   LDLCALC 139 (H) 02/17/2016   LDLDIRECT 143.0 02/17/2016   TRIG 138.0 02/17/2016   CHOLHDL 5 02/17/2016    Lab Results  Component Value Date   ALT 23 07/06/2016   AST 19 07/06/2016   ALKPHOS 67 07/06/2016   BILITOT 0.4 07/06/2016                Constipation    Likely due to  jardiance resultling in increased fluid loss .  Advised to increase water and fiber intake, use of stool softeners and fiber supplements advised         I have discontinued Mr. Sunde cyclobenzaprine, famotidine, canagliflozin, and canagliflozin. I am also having him start on docusate sodium. Additionally, I am having him maintain his multivitamin, aspirin, glucose blood, Insulin Pen Needle, meloxicam, Insulin Glargine, BAYER CONTOUR NEXT TEST, atorvastatin, lisinopril, empagliflozin, metFORMIN, B-D ULTRAFINE III SHORT PEN, and BAYER MICROLET LANCETS.  Meds ordered this encounter  Medications  . docusate sodium (COLACE) 50 MG capsule    Sig: Take 2 capsules (100 mg total) by mouth daily at 3 pm.    Dispense:  60180 capsule    Refill:  3    Medications Discontinued During This Encounter  Medication Reason  . canagliflozin (INVOKANA) 300 MG TABS tablet Patient has not taken in last 30 days  . canagliflozin (INVOKANA) 300 MG TABS tablet Patient has not taken in last 30 days  . cyclobenzaprine (FLEXERIL) 10 MG tablet Patient has not taken in last 30 days  . famotidine (PEPCID) 20 MG tablet Patient has not taken in last 30 days    Follow-up: Return for lab appt monday  9:30  am  fasting lab appt needed .   Sherlene Shams, MD

## 2016-12-20 DIAGNOSIS — K5900 Constipation, unspecified: Secondary | ICD-10-CM | POA: Insufficient documentation

## 2016-12-20 NOTE — Assessment & Plan Note (Signed)
Improved  control with current regimen, but patient concerned about s/e and safety duirng Ramadan.  Advised to suspend insulin during Ramadan.  Lab Results  Component Value Date   HGBA1C 7.2 12/17/2016   Lab Results  Component Value Date   MICROALBUR 1.2 02/17/2016

## 2016-12-20 NOTE — Assessment & Plan Note (Signed)
Well controlled on current regimen. Renal function stable, no changes today.  Lab Results  Component Value Date   CREATININE 0.88 07/06/2016   Lab Results  Component Value Date   NA 137 07/06/2016   K 4.6 07/06/2016   CL 101 07/06/2016   CO2 28 07/06/2016    

## 2016-12-20 NOTE — Assessment & Plan Note (Signed)
Likely due to jardiance resultling in increased fluid loss .  Advised to increase water and fiber intake, use of stool softeners and fiber supplements advised

## 2016-12-20 NOTE — Assessment & Plan Note (Signed)
He is on maximal dose of atorvastatin. LFTs are normal.   Lab Results  Component Value Date   CHOL 210 (H) 02/17/2016   HDL 43.30 02/17/2016   LDLCALC 139 (H) 02/17/2016   LDLDIRECT 143.0 02/17/2016   TRIG 138.0 02/17/2016   CHOLHDL 5 02/17/2016    Lab Results  Component Value Date   ALT 23 07/06/2016   AST 19 07/06/2016   ALKPHOS 67 07/06/2016   BILITOT 0.4 07/06/2016           

## 2016-12-21 ENCOUNTER — Other Ambulatory Visit (INDEPENDENT_AMBULATORY_CARE_PROVIDER_SITE_OTHER): Payer: 59

## 2016-12-21 DIAGNOSIS — E133299 Other specified diabetes mellitus with mild nonproliferative diabetic retinopathy without macular edema, unspecified eye: Secondary | ICD-10-CM | POA: Diagnosis not present

## 2016-12-21 DIAGNOSIS — E1365 Other specified diabetes mellitus with hyperglycemia: Secondary | ICD-10-CM

## 2016-12-21 DIAGNOSIS — IMO0002 Reserved for concepts with insufficient information to code with codable children: Secondary | ICD-10-CM

## 2016-12-21 LAB — LIPID PANEL
CHOLESTEROL: 187 mg/dL (ref 0–200)
HDL: 45.7 mg/dL (ref >39.00)
LDL CALC: 118 mg/dL — AB (ref 0–99)
NonHDL: 141.46
Total CHOL/HDL Ratio: 4
Triglycerides: 115 mg/dL (ref 0.0–149.0)
VLDL: 23 mg/dL (ref 0.0–40.0)

## 2016-12-21 LAB — COMPREHENSIVE METABOLIC PANEL
ALBUMIN: 4.4 g/dL (ref 3.5–5.2)
ALT: 23 U/L (ref 0–53)
AST: 16 U/L (ref 0–37)
Alkaline Phosphatase: 58 U/L (ref 39–117)
BUN: 13 mg/dL (ref 6–23)
CHLORIDE: 101 meq/L (ref 96–112)
CO2: 27 mEq/L (ref 19–32)
Calcium: 9.2 mg/dL (ref 8.4–10.5)
Creatinine, Ser: 0.71 mg/dL (ref 0.40–1.50)
GFR: 123.45 mL/min (ref >60.00)
Glucose, Bld: 158 mg/dL — ABNORMAL HIGH (ref 70–99)
POTASSIUM: 3.8 meq/L (ref 3.5–5.1)
SODIUM: 134 meq/L — AB (ref 135–145)
Total Bilirubin: 0.5 mg/dL (ref 0.2–1.2)
Total Protein: 7.1 g/dL (ref 6.0–8.3)

## 2016-12-21 LAB — LDL CHOLESTEROL, DIRECT: LDL DIRECT: 116 mg/dL

## 2016-12-24 ENCOUNTER — Telehealth: Payer: Self-pay | Admitting: Internal Medicine

## 2016-12-24 NOTE — Telephone Encounter (Signed)
See result note message 

## 2016-12-24 NOTE — Telephone Encounter (Signed)
Patient rtc Randy Grant's call. Please cb regarding labs Pt cb [phone]

## 2017-02-04 ENCOUNTER — Other Ambulatory Visit: Payer: Self-pay | Admitting: Internal Medicine

## 2017-03-10 ENCOUNTER — Other Ambulatory Visit: Payer: Self-pay | Admitting: Internal Medicine

## 2017-03-29 ENCOUNTER — Ambulatory Visit (INDEPENDENT_AMBULATORY_CARE_PROVIDER_SITE_OTHER): Payer: 59 | Admitting: Internal Medicine

## 2017-03-29 ENCOUNTER — Encounter: Payer: Self-pay | Admitting: Internal Medicine

## 2017-03-29 VITALS — BP 114/68 | HR 67 | Temp 98.3°F | Resp 15 | Ht 67.0 in | Wt 150.0 lb

## 2017-03-29 DIAGNOSIS — R5383 Other fatigue: Secondary | ICD-10-CM

## 2017-03-29 DIAGNOSIS — E133299 Other specified diabetes mellitus with mild nonproliferative diabetic retinopathy without macular edema, unspecified eye: Secondary | ICD-10-CM | POA: Diagnosis not present

## 2017-03-29 DIAGNOSIS — K5903 Drug induced constipation: Secondary | ICD-10-CM

## 2017-03-29 DIAGNOSIS — I10 Essential (primary) hypertension: Secondary | ICD-10-CM | POA: Diagnosis not present

## 2017-03-29 DIAGNOSIS — E7800 Pure hypercholesterolemia, unspecified: Secondary | ICD-10-CM

## 2017-03-29 DIAGNOSIS — Z125 Encounter for screening for malignant neoplasm of prostate: Secondary | ICD-10-CM | POA: Diagnosis not present

## 2017-03-29 DIAGNOSIS — IMO0002 Reserved for concepts with insufficient information to code with codable children: Secondary | ICD-10-CM

## 2017-03-29 DIAGNOSIS — E1365 Other specified diabetes mellitus with hyperglycemia: Secondary | ICD-10-CM | POA: Diagnosis not present

## 2017-03-29 LAB — COMPREHENSIVE METABOLIC PANEL
ALK PHOS: 58 U/L (ref 39–117)
ALT: 26 U/L (ref 0–53)
AST: 17 U/L (ref 0–37)
Albumin: 4.1 g/dL (ref 3.5–5.2)
BILIRUBIN TOTAL: 0.4 mg/dL (ref 0.2–1.2)
BUN: 12 mg/dL (ref 6–23)
CO2: 29 mEq/L (ref 19–32)
CREATININE: 0.68 mg/dL (ref 0.40–1.50)
Calcium: 9.3 mg/dL (ref 8.4–10.5)
Chloride: 102 mEq/L (ref 96–112)
GFR: 129.62 mL/min (ref >60.00)
GLUCOSE: 100 mg/dL — AB (ref 70–99)
Potassium: 4 mEq/L (ref 3.5–5.1)
Sodium: 136 mEq/L (ref 135–145)
TOTAL PROTEIN: 7.4 g/dL (ref 6.0–8.3)

## 2017-03-29 LAB — TSH: TSH: 1.16 u[IU]/mL (ref 0.35–4.50)

## 2017-03-29 LAB — HEMOGLOBIN A1C: HEMOGLOBIN A1C: 7.8 % — AB (ref 4.6–6.5)

## 2017-03-29 NOTE — Progress Notes (Signed)
Subjective:  Patient ID: Randy Grant, male    DOB: 16-Sep-1965  Age: 51 y.o. MRN: [id number]  CC: The primary encounter diagnosis was Drug-induced constipation. 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  HPI [name] presents for 3 month follow up on diabetes.  Patient has no complaints today.  Patient is not following a low glycemic index diet due to current cultural norms (He is [place] ) but he is  taking all prescribed medications regularly without side effects.  Fasting sugars have been under less than 140 most of the time and post prandials have been under 200 except on rare occasions. Patient is walking about 3 times per week and not ntentionally trying to lose weight .  Patient has NOT  had an eye exam in the last 12 months and checks feet regularly for signs of infection.  Patient does walk barefoot outside,  And denies any numbness tingling or burning in feet. Patient is up to date on all recommended vaccinations  HAD only 1-2 low blood sugar events during observation of the holy month of Ramadan during which time he fasted from sun up to sundown daily.  He has another holy day to observe with fasting starting today which lasts  for 24 hours.     2 hr postprandials have been <   180 WITH ONE EXCEPTION . Using lantus and metformin at night. And Jardaiance during the day   Wt loss discussed unintentional.  .  Has been Constipated for several months,   drinks a lot of water. Taking colace.2 capsules daily    Outpatient Medications Prior to Visit  Medication Sig Dispense Refill  . aspirin 81 MG tablet Take 81 mg by mouth daily.      Marland Kitchen atorvastatin (LIPITOR) 80 MG tablet TAKE 1 TABLET BY MOUTH DAILY 90 tablet 1  . B-D ULTRAFINE III SHORT PEN  31G X 8 MM MISC TEST BLOOD SUGARS 2 TIMES DAILY 100 each 6  . BAYER CONTOUR NEXT TEST test strip USE AS INSTRUCTED 100 each 5  . BAYER MICROLET LANCETS lancets USE AS DIRECTED 100 each 5  . docusate sodium (COLACE) 50 MG capsule Take 2 capsules (100 mg total) by mouth daily at 3 pm. 60180 capsule 3  . glucose blood (ONE TOUCH ULTRA TEST) test strip Use as instructed three times daily to check blood sugars 100 each 12  . Insulin Pen Needle (B-D ULTRAFINE III SHORT PEN) 31G X 8 MM MISC Test blood sugars 2 times daily 100 each 5  . JARDIANCE 25 MG TABS tablet TAKE 1 TABLET BY MOUTH ONCE DAILY. 30 tablet 5  . lisinopril (PRINIVIL,ZESTRIL) 10 MG tablet TAKE 1 TABLET BY MOUTH DAILY 90 tablet 1  . metFORMIN (GLUCOPHAGE-XR) 500 MG 24 hr tablet TAKE 1 TABLET BY MOUTH 2 TIMES DAILY. 180 tablet 2  . Multiple Vitamin (MULTIVITAMIN) tablet Take 1 tablet by mouth daily.      Marland Kitchen LANTUS SOLOSTAR 100 UNIT/ML Solostar Pen INJECT 35 UNITS INTO THE SKIN DAILY AT 10 PM. 15 mL 5  . meloxicam (MOBIC) 15 MG tablet Take 15 mg by mouth daily. Reported on 02/17/2016     No facility-administered medications prior to visit.     Review of Systems;  Patient denies headache, fevers, malaise,, skin rash, eye pain, sinus congestion and sinus  pain, sore throat, dysphagia,  hemoptysis , cough, dyspnea, wheezing, chest pain, palpitations, orthopnea, edema, abdominal pain, nausea, melena, diarrhea, constipation, flank pain, dysuria, hematuria, urinary  Frequency, nocturia, numbness, tingling, seizures,  Focal weakness, Loss of consciousness,  Tremor, insomnia, depression, anxiety, and suicidal ideation.      Objective:  BP 114/68 (BP Location: Left Arm, Patient Position: Sitting, Cuff Size: Normal)   Pulse 67   Temp 98.3 F (36.8 C) (Oral)   Resp 15   Ht 5\' 7"  (1.702 m)   Wt 150 lb (68 kg)   SpO2 99%   BMI 23.49 kg/m   BP Readings from Last 3 Encounters:  03/29/17 114/68  12/17/16 116/74  07/06/16 128/68    Wt  Readings from Last 3 Encounters:  03/29/17 150 lb (68 kg)  12/17/16 152 lb 12.8 oz (69.3 kg)  07/06/16 154 lb (69.9 kg)    General appearance: alert, cooperative and appears stated age Ears: normal TM's and external ear canals both ears Throat: lips, mucosa, and tongue normal; teeth and gums normal Neck: no adenopathy, no carotid bruit, supple, symmetrical, trachea midline and thyroid not enlarged, symmetric, no tenderness/mass/nodules Back: symmetric, no curvature. ROM normal. No CVA tenderness. Lungs: clear to auscultation bilaterally Heart: regular rate and rhythm, S1, S2 normal, no murmur, click, rub or gallop Abdomen: soft, non-tender; bowel sounds normal; no masses,  no organomegaly Pulses: 2+ and symmetric Skin: Skin color, texture, turgor normal. No rashes or lesions Lymph nodes: Cervical, supraclavicular, and axillary nodes normal.  Lab Results  Component Value Date   HGBA1C 7.8 (H) 03/29/2017   HGBA1C 7.2 12/17/2016   HGBA1C 7.2 (H) 07/06/2016    Lab Results  Component Value Date   CREATININE 0.68 03/29/2017   CREATININE 0.71 12/21/2016   CREATININE 0.88 07/06/2016    Lab Results  Component Value Date   GLUCOSE 100 (H) 03/29/2017   CHOL 187 12/21/2016   TRIG 115.0 12/21/2016   HDL 45.70 12/21/2016   LDLDIRECT 116.0 12/21/2016   LDLCALC 118 (H) 12/21/2016   ALT 26 03/29/2017   AST 17 03/29/2017   NA 136 03/29/2017   K 4.0 03/29/2017   CL 102 03/29/2017   CREATININE 0.68 03/29/2017   BUN 12 03/29/2017   CO2 29 03/29/2017   TSH 1.16 03/29/2017   PSA 0.65 02/17/2016   HGBA1C 7.8 (H) 03/29/2017   MICROALBUR 1.2 02/17/2016    Dg Hip Unilat With Pelvis 2-3 Views Right  Result Date: 10/21/2015 CLINICAL DATA:  Acute right hip pain. EXAM: DG HIP (WITH OR WITHOUT PELVIS) 2-3V RIGHT COMPARISON:  None. FINDINGS: There is no evidence of hip fracture or dislocation. There is no evidence of arthropathy or other focal bone abnormality. IMPRESSION: Normal right hip.  Electronically Signed   By: Lupita Raider, M.D.   On: 10/21/2015 16:16    Assessment & Plan:   Problem List Items Addressed This Visit    Constipation - Primary    Likely due to jardiance resultling in increased fluid loss .  Advised to increase water and fiber intake, continue to use of stool softeners and encouraged to add a daily bulk forming laxative,  limit use of MOM to 1-2/week.  He has no prior screening colonoscopy.  Will discuss at next visit       Relevant Orders   TSH (Completed)   Hyperlipidemia    He is on maximal dose of atorvastatin. LFTs are normal.   Lab Results  Component Value Date   CHOL 187  12/21/2016   HDL 45.70 12/21/2016   LDLCALC 118 (H) 12/21/2016   LDLDIRECT 116.0 12/21/2016   TRIG 115.0 12/21/2016   CHOLHDL 4 12/21/2016    Lab Results  Component Value Date   ALT 26 03/29/2017   AST 17 03/29/2017   ALKPHOS 58 03/29/2017   BILITOT 0.4 03/29/2017                Relevant Orders   Lipid panel   HYPERTENSION, BENIGN ESSENTIAL    Well controlled on current regimen. Renal function stable, no changes today.  Lab Results  Component Value Date   CREATININE 0.68 03/29/2017  . 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  Will increase lantus to 40 units. Continue metformin and jardiance.  Ophthalmology follow up advised but deferred continually by patient due to cost   Lab Results  Component Value Date   HGBA1C 7.8 (H) 03/29/2017   Lab Results  Component Value Date   MICROALBUR 1.2 02/17/2016         Relevant Orders   Hemoglobin A1c (Completed)   Comprehensive metabolic panel (Completed)   Comprehensive metabolic  panel   Hemoglobin A1c   Microalbumin / creatinine urine ratio    Other Visit Diagnoses    Fatigue, unspecified type       Relevant Orders   HIV antibody   CBC with Differential/Platelet   Hepatitis C antibody      I have discontinued Mr. Funes meloxicam. I am also having him maintain his multivitamin, aspirin, glucose blood, Insulin Pen Needle, BAYER CONTOUR NEXT TEST, metFORMIN, B-D ULTRAFINE III SHORT PEN, BAYER MICROLET LANCETS, docusate sodium, atorvastatin, lisinopril, and JARDIANCE.  No orders of the defined types were placed in this encounter.   Medications Discontinued During This Encounter  Medication Reason  . meloxicam (MOBIC) 15 MG tablet Patient has not taken in last 30 days    Follow-up: Return in about 3 months (around 06/29/2017) for follow up diabetes.   Sherlene Shams, MD

## 2017-03-29 NOTE — Patient Instructions (Signed)
  For your constipation:  You can  add a bulk forming laxative:  miralax, metamucil, fibercon, or citrucel Every day  to supplement your stool softener.    You can take milk of magnesium not amore than once or twice a week if you are still not moving bowels easily

## 2017-03-30 ENCOUNTER — Other Ambulatory Visit: Payer: Self-pay | Admitting: Internal Medicine

## 2017-03-30 MED ORDER — INSULIN GLARGINE 100 UNIT/ML SOLOSTAR PEN: 40.0000 [IU] | PEN_INJECTOR | Freq: Every day | SUBCUTANEOUS | 5 refills | Status: DC

## 2017-03-30 NOTE — Assessment & Plan Note (Signed)
Well controlled on current regimen. Renal function stable, no changes today.  Lab Results  Component Value Date   CREATININE 0.68 03/29/2017  . Lab Results  Component Value Date   NA 136 03/29/2017   K 4.0 03/29/2017   CL 102 03/29/2017   CO2 29 03/29/2017

## 2017-03-30 NOTE — Assessment & Plan Note (Addendum)
Likely due to jardiance resultling in increased fluid loss .  Advised to increase water and fiber intake, continue to use of stool softeners and encouraged to add a daily bulk forming laxative,  limit use of MOM to 1-2/week.  He has no prior screening colonoscopy.  Will discuss at next visit

## 2017-03-30 NOTE — Assessment & Plan Note (Signed)
He is on maximal dose of atorvastatin. LFTs are normal.   Lab Results  Component Value Date   CHOL 187 12/21/2016   HDL 45.70 12/21/2016   LDLCALC 118 (H) 12/21/2016   LDLDIRECT 116.0 12/21/2016   TRIG 115.0 12/21/2016   CHOLHDL 4 12/21/2016    Lab Results  Component Value Date   ALT 26 03/29/2017   AST 17 03/29/2017   ALKPHOS 58 03/29/2017   BILITOT 0.4 03/29/2017

## 2017-03-30 NOTE — Assessment & Plan Note (Signed)
Loss of control noted despite reported normoglycemia most days.  Will increase lantus to 40 units. Continue metformin and jardiance.  Ophthalmology follow up advised but deferred continually by patient due to cost   Lab Results  Component Value Date   HGBA1C 7.8 (H) 03/29/2017   Lab Results  Component Value Date   MICROALBUR 1.2 02/17/2016

## 2017-06-11 ENCOUNTER — Other Ambulatory Visit: Payer: Self-pay | Admitting: Internal Medicine

## 2017-06-30 ENCOUNTER — Ambulatory Visit: Payer: 59 | Admitting: Internal Medicine

## 2017-07-05 NOTE — Patient Outreach (Signed)
[facility] Care Management  07/05/2017  Randy Grant 05/24/1966 [id number]   I have removed myself from the care team for Link to Wellness through [facility].  Don will continue to manage his diabetes through the [facility] tool in 2019.   Susette RacerJulie Sabrinia Prien, RN, BA, MHA, CDE [facility] Diabetes Coordinator- Link To [facility] Direct Dial:  [phone]  Fax:  [phone] E-mail: [name] [address], [place], [place]  [phone]

## 2017-08-31 ENCOUNTER — Other Ambulatory Visit: Payer: Self-pay | Admitting: Internal Medicine

## 2017-10-11 ENCOUNTER — Other Ambulatory Visit: Payer: Self-pay | Admitting: Internal Medicine

## 2017-12-22 ENCOUNTER — Other Ambulatory Visit: Payer: Self-pay | Admitting: Internal Medicine

## 2017-12-22 ENCOUNTER — Telehealth: Payer: Self-pay | Admitting: Internal Medicine

## 2017-12-22 MED ORDER — ATORVASTATIN CALCIUM 80 MG PO TABS: 80.0000 mg | ORAL_TABLET | Freq: Every day | ORAL | 1 refills | Status: DC

## 2017-12-22 MED ORDER — LISINOPRIL 10 MG PO TABS: 10.0000 mg | ORAL_TABLET | Freq: Every day | ORAL | 1 refills | Status: DC

## 2017-12-22 MED ORDER — INSULIN GLARGINE 100 UNIT/ML SOLOSTAR PEN: 40.0000 [IU] | PEN_INJECTOR | Freq: Every day | SUBCUTANEOUS | 5 refills | Status: DC

## 2017-12-22 MED ORDER — EMPAGLIFLOZIN 25 MG PO TABS: 25.0000 mg | ORAL_TABLET | Freq: Every day | ORAL | 1 refills | Status: DC

## 2017-12-22 NOTE — Telephone Encounter (Signed)
All prescriptions have been refilled and pt has been notified.

## 2017-12-22 NOTE — Telephone Encounter (Signed)
Pt came into office and made an appt for June 6h. He is requesting a refill on the following ; atorvastatin (LIPITOR) 80 MG tablet, Insulin Glargine (LANTUS SOLOSTAR) 100 UNIT/ML Solostar Pen, lisinopril (PRINIVIL,ZESTRIL) 10 MG tablet, and JARDIANCE 25 MG TABS tablet and if I missed any thing patient needs his BP medication.  Patient wants a call when this is called in please.[phone].

## 2018-01-13 ENCOUNTER — Ambulatory Visit: Payer: 59 | Admitting: Internal Medicine

## 2018-01-13 DIAGNOSIS — Z0289 Encounter for other administrative examinations: Secondary | ICD-10-CM

## 2018-01-13 NOTE — Progress Notes (Deleted)
Patient failed to keep scheduled appointment and will be charged a no show fee.   

## 2018-02-02 ENCOUNTER — Ambulatory Visit (INDEPENDENT_AMBULATORY_CARE_PROVIDER_SITE_OTHER): Payer: 59 | Admitting: Internal Medicine

## 2018-02-02 ENCOUNTER — Encounter: Payer: Self-pay | Admitting: Internal Medicine

## 2018-02-02 VITALS — BP 112/74 | HR 80 | Temp 98.2°F | Resp 15 | Ht 67.0 in | Wt 153.2 lb

## 2018-02-02 DIAGNOSIS — E1365 Other specified diabetes mellitus with hyperglycemia: Secondary | ICD-10-CM

## 2018-02-02 DIAGNOSIS — R5383 Other fatigue: Secondary | ICD-10-CM | POA: Diagnosis not present

## 2018-02-02 DIAGNOSIS — E7800 Pure hypercholesterolemia, unspecified: Secondary | ICD-10-CM

## 2018-02-02 DIAGNOSIS — I10 Essential (primary) hypertension: Secondary | ICD-10-CM | POA: Diagnosis not present

## 2018-02-02 DIAGNOSIS — Z0000 Encounter for general adult medical examination without abnormal findings: Secondary | ICD-10-CM | POA: Diagnosis not present

## 2018-02-02 DIAGNOSIS — R430 Anosmia: Secondary | ICD-10-CM | POA: Diagnosis not present

## 2018-02-02 DIAGNOSIS — E133299 Other specified diabetes mellitus with mild nonproliferative diabetic retinopathy without macular edema, unspecified eye: Secondary | ICD-10-CM

## 2018-02-02 DIAGNOSIS — IMO0002 Reserved for concepts with insufficient information to code with codable children: Secondary | ICD-10-CM

## 2018-02-02 DIAGNOSIS — Z125 Encounter for screening for malignant neoplasm of prostate: Secondary | ICD-10-CM

## 2018-02-02 LAB — POCT GLYCOSYLATED HEMOGLOBIN (HGB A1C): Hemoglobin A1C: 9.1 % — AB (ref 4.0–5.6)

## 2018-02-02 NOTE — Progress Notes (Signed)
Patient ID: Randy Grant, male    DOB: 03/09/1966  Age: 52 y.o. MRN: [id number]  The patient is here for annual preventive  examination and management of other chronic and acute problems.   The risk factors are reflected in the social history.  The roster of all physicians providing medical care to patient - is listed in the Snapshot section of the chart.  Activities of daily living:  The patient is 100% independent in all ADLs: dressing, toileting, feeding as well as independent mobility  Home safety : The patient has smoke detectors in the home. They wear seatbelts.  There are no firearms at home. There is no violence in the home.   There is no risks for hepatitis, STDs or HIV. There is no   history of blood transfusion. They have no travel history to infectious disease endemic areas of the world.  The patient has seen their dentist in the last six months. They have seen their eye doctor in the last year. .  They have deferred audiologic testing in the last year.  They do not  have excessive sun exposure. Discussed the need for sun protection: hats, long sleeves and use of sunscreen if there is significant sun exposure.   Diet: the importance of a healthy diet is discussed. His diet is culturally appropriate to his nation of origin but NOT low glycemic index .  The benefits of regular aerobic exercise were discussed. She does not exercise .  Depression screen: there are no signs or vegative symptoms of depression- irritability, change in appetite, anhedonia, sadness/tearfullness.  The following portions of the patient's history were reviewed and updated as appropriate: allergies, current medications, past family history, past medical history,  past surgical history, past social history  and problem list.  Visual acuity was not assessed per patient preference since she has regular follow up with her ophthalmologist. Hearing and body mass index were assessed and reviewed.   During the  course of the visit the patient was educated and counseled about appropriate screening and preventive services including : fall prevention , diabetes screening, nutrition counseling, colorectal cancer screening, and recommended immunizations.    CC: The primary encounter diagnosis was Encounter for preventive health examination. 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  Uncontrolled diabetes  Last seen in August 2018.  Has had elevated blood sugars persistently due to work schedule and cultural norms which include frequent  fasting .  He recently fasted for Ramadan.  And notes that his blood sugars were elevated because he admits to eating too many concentrated sweets after sundown when he can break his fast .  He recalls that his blood sugars have been frequently elevated to 220    Using lantus  No charge.  Randy Grant is $25/month   Has  6 pen left of the lantus     Works 6 days per week  Having intermittent anosmia. Has intermittent allergic rhinitis. Wants to see ENT  Can  only be on a Monday mornign 11:00 am   To 2 pm    History Randy Grant has a past medical history of Cataract (2017), Diabetes mellitus, GERD (gastroesophageal reflux disease), Hyperlipidemia, and Hypertension.   He has no past surgical history on file.   His family history includes Diabetes in his father, mother, sister, and sister; Hyperlipidemia in  his father.He reports that he quit smoking about 16 years ago. He has never used smokeless tobacco. He reports that he does not drink alcohol or use drugs.  Outpatient Medications Prior to Visit  Medication Sig Dispense Refill  . aspirin 81 MG tablet Take 81 mg by mouth daily.      Marland Kitchen atorvastatin (LIPITOR) 80 MG tablet Take 1 tablet (80 mg  total) by mouth daily. 90 tablet 1  . B-D ULTRAFINE III SHORT PEN 31G X 8 MM MISC TEST BLOOD SUGARS 2 TIMES DAILY 100 each 6  . BAYER CONTOUR NEXT TEST test strip USE AS INSTRUCTED 100 each 5  . BAYER MICROLET LANCETS lancets USE AS DIRECTED 100 each 5  . docusate sodium (COLACE) 50 MG capsule Take 2 capsules (100 mg total) by mouth daily at 3 pm. 60180 capsule 3  . empagliflozin (JARDIANCE) 25 MG TABS tablet Take 25 mg by mouth daily. 90 tablet 1  . glucose blood (ONE TOUCH ULTRA TEST) test strip Use as instructed three times daily to check blood sugars 100 each 12  . Insulin Glargine (LANTUS SOLOSTAR) 100 UNIT/ML Solostar Pen Inject 40 Units into the skin daily at 10 pm. 15 mL 5  . Insulin Pen Needle (B-D ULTRAFINE III SHORT PEN) 31G X 8 MM MISC Test blood sugars 2 times daily 100 each 5  . lisinopril (PRINIVIL,ZESTRIL) 10 MG tablet Take 1 tablet (10 mg total) by mouth daily. 90 tablet 1  . metFORMIN (GLUCOPHAGE-XR) 500 MG 24 hr tablet TAKE 1 TABLET BY MOUTH 2 TIMES DAILY 180 tablet 2  . Multiple Vitamin (MULTIVITAMIN) tablet Take 1 tablet by mouth daily.       No facility-administered medications prior to visit.     Review of Systems   Patient denies headache, fevers, malaise, unintentional weight loss, skin rash, eye pain, sinus congestion and sinus pain, sore throat, dysphagia,  hemoptysis , cough, dyspnea, wheezing, chest pain, palpitations, orthopnea, edema, abdominal pain, nausea, melena, diarrhea, constipation, flank pain, dysuria, hematuria, urinary  Frequency, nocturia, numbness, tingling, seizures,  Focal weakness, Loss of consciousness,  Tremor, insomnia, depression, anxiety, and suicidal ideation.     Objective:  BP 112/74 (BP Location: Left Arm, Patient Position: Sitting, Cuff Size: Normal)   Pulse 80   Temp 98.2 F (36.8 C) (Oral)   Resp 15   Ht 5\' 7"  (1.702 m)   Wt 153 lb 3.2 oz (69.5 kg)   SpO2 98%   BMI 23.99 kg/m   Physical Exam   General appearance: alert,  cooperative and appears stated age Ears: normal TM's and external ear canals both ears Throat: lips, mucosa, and tongue normal; teeth and gums normal Neck: no adenopathy, no carotid bruit, supple, symmetrical, trachea midline and thyroid not enlarged, symmetric, no tenderness/mass/nodules Back: symmetric, no curvature. ROM normal. No CVA tenderness. Lungs: clear to auscultation bilaterally Heart: regular rate and rhythm, S1, S2 normal, no murmur, click, rub or gallop Abdomen: soft, non-tender; bowel sounds normal; no masses,  no organomegaly Pulses: 2+ and symmetric Skin: Skin color, texture, turgor normal. No rashes or lesions Lymph nodes: Cervical, supraclavicular, and axillary nodes normal.    Assessment & Plan:   Problem List Items Addressed This Visit    Prostate cancer screening   Hyperlipidemia   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)    He remains uncontrolled  Despite addition of lantus and jardiance.  Culture and socioeconomic factors identified ( patient is Muslim and has many fasting days,  And works  6 days per week,  3rd shift) . I have asked patient to submit a log of blood sugars every 2 weeksor pre and post meals targeting one time per period so I can determine the appropriate dose of quick acting insulin to use  .   Will continue lantus at 40 units. Continue metformin and jardiance.  Ophthalmology follow up advised but deferred continually by patient due to cost   Lab Results  Component Value Date   HGBA1C 9.1 (A) 02/02/2018   Lab Results  Component Value Date   MICROALBUR <0.7 02/02/2018         HYPERTENSION, BENIGN ESSENTIAL    Well controlled on current regimen. Renal function stable, no changes today.  Lab Results  Component Value Date   CREATININE 0.94 02/02/2018   Lab Results  Component Value Date   NA 135 02/02/2018   K 4.6 02/02/2018   CL 100 02/02/2018   CO2 28 02/02/2018         Encounter for preventive health examination -  Primary   Anosmia    Intermittent.  Not accompanied by sinus congestion or pain,  Headaches,  Or problems with taste. ENT referral       Relevant Orders   Ambulatory referral to ENT    Other Visit Diagnoses    Fatigue, unspecified type          I am having Randy Grant maintain his multivitamin, aspirin, glucose blood, Insulin Pen Needle, BAYER CONTOUR NEXT TEST, BAYER MICROLET LANCETS, docusate sodium, metFORMIN, B-D ULTRAFINE III SHORT PEN, atorvastatin, Insulin Glargine, lisinopril, and empagliflozin.  No orders of the defined types were placed in this encounter.   There are no discontinued medications.  Follow-up: Return in about 3 months (around 05/05/2018) for follow up diabetes.   Sherlene Shams, MD

## 2018-02-02 NOTE — Patient Instructions (Addendum)
Your diabetes is uncontrolled .  Your a1c should be < 8.0 and it is 9.1    I need to see blood sugars from  the last 2 weeks  Or for two more weeks in order to adjust your medications.  The following  Please check twice daily at any of  these times   Before breakfast,  And  2 hour after breakfast  Before lunch and r two hours after lunch  Before dinner and 2 hours after dinner     Check with your insurance about the cologuard test as a covered screening test for colon cancer. If it is covered, we will send it to your home to complete.    Health Maintenance, Male A healthy lifestyle and preventive care is important for your health and wellness. Ask your health care provider about what schedule of regular examinations is right for you. What should I know about weight and diet? Eat a Healthy Diet  Eat plenty of vegetables, fruits, whole grains, low-fat dairy products, and lean protein.  Do not eat a lot of foods high in solid fats, added sugars, or salt.  Maintain a Healthy Weight Regular exercise can help you achieve or maintain a healthy weight. You should:  Do at least 150 minutes of exercise each week. The exercise should increase your heart rate and make you sweat (moderate-intensity exercise).  Do strength-training exercises at least twice a week.  Watch Your Levels of Cholesterol and Blood Lipids  Have your blood tested for lipids and cholesterol every 5 years starting at 54 years of age. If you are at high risk for heart disease, you should start having your blood tested when you are 54 years old. You may need to have your cholesterol levels checked more often if: ? Your lipid or cholesterol levels are high. ? You are older than 54 years of age. ? You are at high risk for heart disease.  What should I know about cancer screening? Many types of cancers can be detected early and may often be prevented. Lung Cancer  You should be screened every year for lung cancer  if: ? You are a current smoker who has smoked for at least 30 years. ? You are a former smoker who has quit within the past 15 years.  Talk to your health care provider about your screening options, when you should start screening, and how often you should be screened.  Colorectal Cancer  Routine colorectal cancer screening usually begins at 54 years of age and should be repeated every 5-10 years until you are 54 years old. You may need to be screened more often if early forms of precancerous polyps or small growths are found. Your health care provider may recommend screening at an earlier age if you have risk factors for colon cancer.  Your health care provider may recommend using home test kits to check for hidden blood in the stool.  A small camera at the end of a tube can be used to examine your colon (sigmoidoscopy or colonoscopy). This checks for the earliest forms of colorectal cancer.  Prostate and Testicular Cancer  Depending on your age and overall health, your health care provider may do certain tests to screen for prostate and testicular cancer.  Talk to your health care provider about any symptoms or concerns you have about testicular or prostate cancer.  Skin Cancer  Check your skin from head to toe regularly.  Tell your health care provider about any new moles  or changes in moles, especially if: ? There is a change in a mole's size, shape, or color. ? You have a mole that is larger than a pencil eraser.  Always use sunscreen. Apply sunscreen liberally and repeat throughout the day.  Protect yourself by wearing long sleeves, pants, a wide-brimmed hat, and sunglasses when outside.  What should I know about heart disease, diabetes, and high blood pressure?  If you are 18-5 years of age, have your blood pressure checked every 3-5 years. If you are 75 years of age or older, have your blood pressure checked every year. You should have your blood pressure measured  twice-once when you are at a hospital or clinic, and once when you are not at a hospital or clinic. Record the average of the two measurements. To check your blood pressure when you are not at a hospital or clinic, you can use: ? An automated blood pressure machine at a pharmacy. ? A home blood pressure monitor.  Talk to your health care provider about your target blood pressure.  If you are between 67-67 years old, ask your health care provider if you should take aspirin to prevent heart disease.  Have regular diabetes screenings by checking your fasting blood sugar level. ? If you are at a normal weight and have a low risk for diabetes, have this test once every three years after the age of 56. ? If you are overweight and have a high risk for diabetes, consider being tested at a younger age or more often.  A one-time screening for abdominal aortic aneurysm (AAA) by ultrasound is recommended for men aged 65-75 years who are current or former smokers. What should I know about preventing infection? Hepatitis B If you have a higher risk for hepatitis B, you should be screened for this virus. Talk with your health care provider to find out if you are at risk for hepatitis B infection. Hepatitis C Blood testing is recommended for:  Everyone born from [birth year] through 1967.  Anyone with known risk factors for hepatitis C.  Sexually Transmitted Diseases (STDs)  You should be screened each year for STDs including gonorrhea and chlamydia if: ? You are sexually active and are younger than 54 years of age. ? You are older than 54 years of age and your health care provider tells you that you are at risk for this type of infection. ? Your sexual activity has changed since you were last screened and you are at an increased risk for chlamydia or gonorrhea. Ask your health care provider if you are at risk.  Talk with your health care provider about whether you are at high risk of being infected with HIV.  Your health care provider may recommend a prescription medicine to help prevent HIV infection.  What else can I do?  Schedule regular health, dental, and eye exams.  Stay current with your vaccines (immunizations).  Do not use any tobacco products, such as cigarettes, chewing tobacco, and e-cigarettes. If you need help quitting, ask your health care provider.  Limit alcohol intake to no more than 2 drinks per day. One drink equals 12 ounces of beer, 5 ounces of wine, or 1 ounces of hard liquor.  Do not use street drugs.  Do not share needles.  Ask your health care provider for help if you need support or information about quitting drugs.  Tell your health care provider if you often feel depressed.  Tell your health care provider if you have  ever been abused or do not feel safe at home. This information is not intended to replace advice given to you by your health care provider. Make sure you discuss any questions you have with your health care provider. Document Released: 01/23/2008 Document Revised: 03/25/2016 Document Reviewed: 04/30/2015 Elsevier Interactive Patient Education  [employer].

## 2018-02-02 NOTE — Addendum Note (Signed)
Addended by: Jaquavius Hudler S on: 02/02/2018 05:07 PM   Modules accepted: Orders  

## 2018-02-02 NOTE — Addendum Note (Signed)
Addended by: Warden FillersWRIGHT, LATOYA S on: 02/02/2018 05:07 PM   Modules accepted: Orders

## 2018-02-03 LAB — COMPREHENSIVE METABOLIC PANEL
ALBUMIN: 4.5 g/dL (ref 3.5–5.2)
ALT: 36 U/L (ref 0–53)
AST: 19 U/L (ref 0–37)
Alkaline Phosphatase: 66 U/L (ref 39–117)
BUN: 11 mg/dL (ref 6–23)
CHLORIDE: 100 meq/L (ref 96–112)
CO2: 28 mEq/L (ref 19–32)
Calcium: 9.7 mg/dL (ref 8.4–10.5)
Creatinine, Ser: 0.94 mg/dL (ref 0.40–1.50)
GFR: 88.92 mL/min (ref >60.00)
Glucose, Bld: 213 mg/dL — ABNORMAL HIGH (ref 70–99)
Potassium: 4.6 mEq/L (ref 3.5–5.1)
SODIUM: 135 meq/L (ref 135–145)
Total Bilirubin: 0.4 mg/dL (ref 0.2–1.2)
Total Protein: 7.1 g/dL (ref 6.0–8.3)

## 2018-02-03 LAB — MICROALBUMIN / CREATININE URINE RATIO
CREATININE, U: 27.3 mg/dL
Microalb Creat Ratio: 2.6 mg/g (ref 0.0–30.0)
Microalb, Ur: <0.7 mg/dL (ref 0.0–1.9)

## 2018-02-03 LAB — CBC WITH DIFFERENTIAL/PLATELET
BASOS ABS: 0 10*3/uL (ref 0.0–0.1)
Basophils Relative: 0.7 % (ref 0.0–3.0)
EOS ABS: 0.1 10*3/uL (ref 0.0–0.7)
Eosinophils Relative: 1.5 % (ref 0.0–5.0)
HCT: 46.7 % (ref 39.0–52.0)
Hemoglobin: 15.5 g/dL (ref 13.0–17.0)
LYMPHS ABS: 1.4 10*3/uL (ref 0.7–4.0)
Lymphocytes Relative: 30.7 % (ref 12.0–46.0)
MCHC: 33.2 g/dL (ref 30.0–36.0)
MCV: 81.4 fl (ref 78.0–100.0)
MONO ABS: 0.4 10*3/uL (ref 0.1–1.0)
MONOS PCT: 9.4 % (ref 3.0–12.0)
Neutro Abs: 2.7 10*3/uL (ref 1.4–7.7)
Neutrophils Relative %: 57.7 % (ref 43.0–77.0)
Platelets: 288 10*3/uL (ref 150.0–400.0)
RBC: 5.74 Mil/uL (ref 4.22–5.81)
RDW: 15 % (ref 11.5–15.5)
WBC: 4.6 10*3/uL (ref 4.0–10.5)

## 2018-02-03 LAB — LIPID PANEL
CHOL/HDL RATIO: 4
Cholesterol: 201 mg/dL — ABNORMAL HIGH (ref 0–200)
HDL: 45.4 mg/dL (ref >39.00)
LDL CALC: 117 mg/dL — AB (ref 0–99)
NONHDL: 155.76
Triglycerides: 193 mg/dL — ABNORMAL HIGH (ref 0.0–149.0)
VLDL: 38.6 mg/dL (ref 0.0–40.0)

## 2018-02-03 LAB — PSA: PSA: 0.67 ng/mL (ref 0.10–4.00)

## 2018-02-04 LAB — HEPATITIS C ANTIBODY
Hepatitis C Ab: NONREACTIVE
SIGNAL TO CUT-OFF: 0 (ref <1.00)

## 2018-02-04 LAB — HIV ANTIBODY (ROUTINE TESTING W REFLEX): HIV 1&2 Ab, 4th Generation: NONREACTIVE

## 2018-02-05 DIAGNOSIS — R430 Anosmia: Secondary | ICD-10-CM | POA: Insufficient documentation

## 2018-02-05 NOTE — Assessment & Plan Note (Signed)
Well controlled on current regimen. Renal function stable, no changes today.  Lab Results  Component Value Date   CREATININE 0.94 02/02/2018   Lab Results  Component Value Date   NA 135 02/02/2018   K 4.6 02/02/2018   CL 100 02/02/2018   CO2 28 02/02/2018

## 2018-02-05 NOTE — Assessment & Plan Note (Signed)
Intermittent.  Not accompanied by sinus congestion or pain,  Headaches,  Or problems with taste. ENT referral

## 2018-02-05 NOTE — Assessment & Plan Note (Signed)
He remains uncontrolled  Despite addition of lantus and jardiance.  Culture and socioeconomic factors identified ( patient is Muslim and has many fasting days,  And works 6 days per week,  3rd shift) . I have asked patient to submit a log of blood sugars every 2 weeksor pre and post meals targeting one time per period so I can determine the appropriate dose of quick acting insulin to use  .   Will continue lantus at 40 units. Continue metformin and jardiance.  Ophthalmology follow up advised but deferred continually by patient due to cost   Lab Results  Component Value Date   HGBA1C 9.1 (A) 02/02/2018   Lab Results  Component Value Date   MICROALBUR <0.7 02/02/2018

## 2018-02-09 ENCOUNTER — Telehealth: Payer: Self-pay | Admitting: Internal Medicine

## 2018-02-09 NOTE — Telephone Encounter (Signed)
Copied from CRM [id number]. Topic: Quick Communication - Lab Results >> Feb 08, 2018 11:37 AM Sandy SalaamWoodward, Jessica, CMA wrote: Called patient to inform them of 02/05/2018 lab results. When patient returns call, triage nurse may disclose results.  [facility] may speak with pt.  >> Feb 09, 2018  4:15 PM Darletta MollLander, Lumin L wrote: No [facility] RN free for results.

## 2018-02-09 NOTE — Telephone Encounter (Signed)
Charted in result notes. 

## 2018-03-07 ENCOUNTER — Other Ambulatory Visit: Payer: Self-pay | Admitting: Unknown Physician Specialty

## 2018-03-07 DIAGNOSIS — R430 Anosmia: Secondary | ICD-10-CM | POA: Diagnosis not present

## 2018-03-21 ENCOUNTER — Ambulatory Visit: Admission: RE | Admit: 2018-03-21 | Payer: 59 | Source: Ambulatory Visit

## 2018-04-04 ENCOUNTER — Encounter: Payer: Self-pay | Admitting: Internal Medicine

## 2018-04-04 ENCOUNTER — Ambulatory Visit (INDEPENDENT_AMBULATORY_CARE_PROVIDER_SITE_OTHER): Payer: 59 | Admitting: Internal Medicine

## 2018-04-04 ENCOUNTER — Telehealth: Payer: Self-pay | Admitting: Pharmacist

## 2018-04-04 ENCOUNTER — Other Ambulatory Visit: Payer: Self-pay | Admitting: Internal Medicine

## 2018-04-04 VITALS — BP 114/70 | HR 78 | Temp 98.1°F | Resp 15 | Ht 67.0 in | Wt 154.6 lb

## 2018-04-04 DIAGNOSIS — IMO0002 Reserved for concepts with insufficient information to code with codable children: Secondary | ICD-10-CM

## 2018-04-04 DIAGNOSIS — E11319 Type 2 diabetes mellitus with unspecified diabetic retinopathy without macular edema: Secondary | ICD-10-CM | POA: Diagnosis not present

## 2018-04-04 DIAGNOSIS — E1165 Type 2 diabetes mellitus with hyperglycemia: Secondary | ICD-10-CM

## 2018-04-04 DIAGNOSIS — R430 Anosmia: Secondary | ICD-10-CM

## 2018-04-04 DIAGNOSIS — I10 Essential (primary) hypertension: Secondary | ICD-10-CM

## 2018-04-04 LAB — POCT GLYCOSYLATED HEMOGLOBIN (HGB A1C): HEMOGLOBIN A1C: 7.9 % — AB (ref 4.0–5.6)

## 2018-04-04 MED ORDER — METFORMIN HCL ER 500 MG PO TB24: 1000.0000 mg | ORAL_TABLET | Freq: Two times a day (BID) | ORAL | 2 refills | Status: DC

## 2018-04-04 MED ORDER — INSULIN LISPRO 100 UNIT/ML (KWIKPEN): 5.0000 [IU] | PEN_INJECTOR | Freq: Three times a day (TID) | SUBCUTANEOUS | 11 refills | Status: DC

## 2018-04-04 NOTE — Progress Notes (Signed)
Subjective:  Patient ID: Randy Grant, male    DOB: 11/08/1965  Age: 52 y.o. 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 Diagnoses of Anosmia and HYPERTENSION, BENIGN ESSENTIAL were also pertinent to this visit.  HPI Randy Grant presents for follow up on uncontrolled  diabetes and hypertension  Last Seen 2 months ago and a1c was  9.9 .  No med changes were made due to lack of available patient data on blood sugars and history of recurrent hypoglycemia due to work shift (3rd) and cultural norms (Patient is Muslim and fasts frequently) .  He has been taking  40 units Lantus, less than maximal dose of metformin and Jardiance.   Today's Sugars 144 pre meal ,  Fasting  98.  He states that most of his sugars have been "high"  for the past several weeks.  He is not tolerating   Jardiance. Causing excessive urination    Other issues:  He was referred to ENT and saw  Randy Grant for workup of anosmia . Exam included a fiberoptic evaluation of sinuses which was normal,  So an  MRI was ordered but not done due to out of pocket cost to  patient .  He also received a bill for $350 for  Randy Grant's visit .    Outpatient Medications Prior to Visit  Medication Sig Dispense Refill  . aspirin 81 MG tablet Take 81 mg by mouth daily.      Marland Kitchen. atorvastatin (LIPITOR) 80 MG tablet Take 1 tablet (80 mg total) by mouth daily. 90 tablet 1  . B-D ULTRAFINE III SHORT PEN 31G X 8 MM MISC TEST BLOOD SUGARS 2 TIMES DAILY 100 each 6  . BAYER CONTOUR NEXT TEST test strip USE AS INSTRUCTED 100 each 5  . BAYER MICROLET LANCETS lancets USE AS DIRECTED 100 each 5  . docusate sodium (COLACE) 50 MG capsule Take 2 capsules (100 mg total) by mouth daily at 3 pm. 60180 capsule 3  . glucose blood (ONE TOUCH ULTRA TEST) test strip Use as instructed three times daily to check blood sugars 100 each 12  . Insulin Glargine (LANTUS SOLOSTAR) 100 UNIT/ML Solostar  Pen Inject 40 Units into the skin daily at 10 pm. 15 mL 5  . Insulin Pen Needle (B-D ULTRAFINE III SHORT PEN) 31G X 8 MM MISC Test blood sugars 2 times daily 100 each 5  . lisinopril (PRINIVIL,ZESTRIL) 10 MG tablet Take 1 tablet (10 mg total) by mouth daily. 90 tablet 1  . Multiple Vitamin (MULTIVITAMIN) tablet Take 1 tablet by mouth daily.      . empagliflozin (JARDIANCE) 25 MG TABS tablet Take 25 mg by mouth daily. 90 tablet 1  . metFORMIN (GLUCOPHAGE-XR) 500 MG 24 hr tablet TAKE 1 TABLET BY MOUTH 2 TIMES DAILY 180 tablet 2   No facility-administered medications prior to visit.     Review of Systems;  Patient denies headache, fevers, malaise, unintentional weight loss, skin rash, eye pain, sinus congestion and sinus pain, sore throat, dysphagia,  hemoptysis , cough, dyspnea, wheezing, chest pain, palpitations, orthopnea, edema, abdominal pain, nausea, melena, diarrhea, constipation, flank pain, dysuria, hematuria, urinary  Frequency, nocturia, numbness, tingling, seizures,  Focal weakness, Loss of consciousness,  Tremor, insomnia, depression, anxiety, and suicidal ideation.      Objective:  BP 114/70 (BP Location: Left Arm, Patient Position: Sitting, Cuff Size: Normal)   Pulse 78   Temp 98.1 F (36.7  C) (Oral)   Resp 15   Ht 5\' 7"  (1.702 m)   Wt 154 lb 9.6 oz (70.1 kg)   SpO2 98%   BMI 24.21 kg/m   BP Readings from Last 3 Encounters:  04/04/18 114/70  02/02/18 112/74  03/29/17 114/68    Wt Readings from Last 3 Encounters:  04/04/18 154 lb 9.6 oz (70.1 kg)  02/02/18 153 lb 3.2 oz (69.5 kg)  03/29/17 150 lb (68 kg)    General appearance: alert, cooperative and appears stated age Ears: normal TM's and external ear canals both ears Throat: lips, mucosa, and tongue normal; teeth and gums normal Neck: no adenopathy, no carotid bruit, supple, symmetrical, trachea midline and thyroid not enlarged, symmetric, no tenderness/mass/nodules Back: symmetric, no curvature. ROM normal.  No CVA tenderness. Lungs: clear to auscultation bilaterally Heart: regular rate and rhythm, S1, S2 normal, no murmur, click, rub or gallop Abdomen: soft, non-tender; bowel sounds normal; no masses,  no organomegaly Pulses: 2+ and symmetric Skin: Skin color, texture, turgor normal. No rashes or lesions Lymph nodes: Cervical, supraclavicular, and axillary nodes normal.  Lab Results  Component Value Date   HGBA1C 7.9 (A) 04/04/2018   HGBA1C 9.1 (A) 02/02/2018   HGBA1C 7.8 (H) 03/29/2017    Lab Results  Component Value Date   CREATININE 0.94 02/02/2018   CREATININE 0.68 03/29/2017   CREATININE 0.71 12/21/2016    Lab Results  Component Value Date   WBC 4.6 02/02/2018   HGB 15.5 02/02/2018   HCT 46.7 02/02/2018   PLT 288.0 02/02/2018   GLUCOSE 213 (H) 02/02/2018   CHOL 201 (H) 02/02/2018   TRIG 193.0 (H) 02/02/2018   HDL 45.40 02/02/2018   LDLDIRECT 116.0 12/21/2016   LDLCALC 117 (H) 02/02/2018   ALT 36 02/02/2018   AST 19 02/02/2018   NA 135 02/02/2018   K 4.6 02/02/2018   CL 100 02/02/2018   CREATININE 0.94 02/02/2018   BUN 11 02/02/2018   CO2 28 02/02/2018   TSH 1.16 03/29/2017   PSA 0.67 02/02/2018   HGBA1C 7.9 (A) 04/04/2018   MICROALBUR <0.7 02/02/2018    Dg Hip Unilat With Pelvis 2-3 Views Right  Result Date: 10/21/2015 CLINICAL DATA:  Acute right hip pain. EXAM: DG HIP (WITH OR WITHOUT PELVIS) 2-3V RIGHT COMPARISON:  None. FINDINGS: There is no evidence of hip fracture or dislocation. There is no evidence of arthropathy or other focal bone abnormality. IMPRESSION: Normal right hip. Electronically Signed   By: Lupita Raider, M.D.   On: 10/21/2015 16:16    Assessment & Plan:   Problem List Items Addressed This Visit    Anosmia    ENT evaluation of sinuses was reportedly normal, and MRI was ordered but not done due to patient cost. He has no history of head trauma,  Headaches, vision or or other worrisome symptoms that might suggest a brain tumor.        HYPERTENSION, BENIGN ESSENTIAL    Well controlled on current regimen. Renal function stable, no changes today.      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  Culture and socioeconomic factors identified ( patient is Muslim and has many fasting days,  And works 6 days per week,  3rd shift) . I have asked patient to see our clinical pharmacist , which was done today,  And metformin dose was increased.  I have also recommended used of mealtime insulin to obtain greater control of post prandial sugars since his fastings appear to be in range (based on on reading, unfortunately ).  Hopefully his insurance will cover a free style monitor    Will continue lantus at 40 units. Stopping  jardiance.  Ophthalmology follow up advised for known diabetic retinopathy but deferred continually by patient due to cost   Lab Results  Component Value Date   HGBA1C 7.9 (A) 04/04/2018   Lab Results  Component Value Date   MICROALBUR <0.7 02/02/2018         Relevant Medications   insulin lispro (HUMALOG KWIKPEN) 100 UNIT/ML KiwkPen   Other Relevant Orders   POCT HgB A1C (Completed)   Amb Referral to Clinical Pharmacist      I have discontinued Jozsef E. Velarde's empagliflozin. I am also having him start on insulin lispro. Additionally, I am having him maintain his multivitamin, aspirin, glucose blood, Insulin Pen Needle, BAYER CONTOUR NEXT TEST, BAYER MICROLET LANCETS, docusate sodium, B-D ULTRAFINE III SHORT PEN, atorvastatin, Insulin Glargine, and lisinopril.  Meds ordered this encounter  Medications  . insulin lispro (HUMALOG KWIKPEN) 100 UNIT/ML KiwkPen    Sig: Inject 0.05 mLs (5 Units total) into the skin 3 (three) times daily.    Dispense:  15 mL    Refill:  11    Medications Discontinued During This Encounter  Medication Reason  . empagliflozin (JARDIANCE)  25 MG TABS tablet     Follow-up: No follow-ups on file.   Sherlene Shams, MD

## 2018-04-04 NOTE — Telephone Encounter (Addendum)
Saw patient in office today at the request of Dr. Derrel Nip.  S/O:  Patient is a 52 year old male with uncontrolled type 2 diabetes, currently treated with metformin 500 mg BID, Jardiance 25 mg daily, and Lantus 40 units daily. Tight control has been difficult, partially due to patient's religious fasts. Due to this, Dr. Derrel Nip was interested in started FreeStyle Libre CGM.   POCT A1c today in clinic was 7.9%. D/t urinary symptoms and intolerance, Jardiance was discontinued. Humalog 5 units with meals was initiated. Last eGFR 88 ml/min/1.15m.   A/P:  - Recommended optimizing metformin dosing to 1000 mg BID. Prescription sent in by Dr. TDerrel Nip counseled patient on potential GI side effects with dose increase.  [facility] [place] and checking SMBG 4+ times day for Prior Authorization for [employer] is $50 and 1 month supply of sensors is $75. Contacted patient; he would like to think about if he can afford this before we send in the prescription.   Patient to follow up with me in 3 weeks for diabetes management.   Catie TDarnelle Maffucci PharmD PGY2 Ambulatory Care Pharmacy Resident Phone: [phone]

## 2018-04-04 NOTE — Patient Instructions (Addendum)
Your diabetes control is improving!!!!  I am starting you on a short acting insulin to take right before each meal.   5 units  Before each meal  Continue using Lantus .  Stop the jardiance   I am making a referral to our clincial  Pharmacist, Vanice Sarahatherine Travis, Pharm D.  to help us get a Freestyle Monitor that you can use to check your blood sugars several times daily  instead of pricking your finger   Lab Results  Component Value Date   CREATININE 0.94 02/02/2018

## 2018-04-05 NOTE — Assessment & Plan Note (Signed)
Well controlled on current regimen. Renal function stable, no changes today. 

## 2018-04-05 NOTE — Assessment & Plan Note (Signed)
ENT evaluation of sinuses was reportedly normal, and MRI was ordered but not done due to patient cost. He has no history of head trauma,  Headaches, vision or or other worrisome symptoms that might suggest a brain tumor.

## 2018-04-05 NOTE — Assessment & Plan Note (Signed)
His diabetes control has improved since last visit but he is not tolerating jardiance due to frequent urination..  Culture and socioeconomic factors identified ( patient is Muslim and has many fasting days,  And works 6 days per week,  3rd shift) . I have asked patient to see our clinical pharmacist , which was done today,  And metformin dose was increased.  I have also recommended used of mealtime insulin to obtain greater control of post prandial sugars since his fastings appear to be in range (based on on reading, unfortunately ).  Hopefully his insurance will cover a free style monitor    Will continue lantus at 40 units. Stopping  jardiance.  Ophthalmology follow up advised for known diabetic retinopathy but deferred continually by patient due to cost   Lab Results  Component Value Date   HGBA1C 7.9 (A) 04/04/2018   Lab Results  Component Value Date   MICROALBUR <0.7 02/02/2018

## 2018-04-06 NOTE — Telephone Encounter (Signed)
  I have reviewed the above information and agree with above.   Jenner Rosier, MD 

## 2018-04-20 ENCOUNTER — Telehealth: Payer: Self-pay

## 2018-04-20 NOTE — Telephone Encounter (Signed)
Copied from CRM [id number]. Topic: Appointment Scheduling - Scheduling Inquiry for Clinic >> Apr 20, 2018  2:44 PM Baldo Daub L wrote: Reason for CRM:   Pt calling to reschedule appointment on Monday with Vanice Sarah. Pt can be reached at [phone]  Called and rescheduled patient for appointment with Vanice Sarah on 05-02-18 at 1:00.

## 2018-04-25 ENCOUNTER — Ambulatory Visit: Payer: 59 | Admitting: Pharmacist

## 2018-05-02 ENCOUNTER — Encounter: Payer: Self-pay | Admitting: Pharmacist

## 2018-05-02 ENCOUNTER — Ambulatory Visit (INDEPENDENT_AMBULATORY_CARE_PROVIDER_SITE_OTHER): Payer: 59 | Admitting: Pharmacist

## 2018-05-02 VITALS — BP 122/84 | HR 73 | Wt 157.6 lb

## 2018-05-02 DIAGNOSIS — IMO0002 Reserved for concepts with insufficient information to code with codable children: Secondary | ICD-10-CM

## 2018-05-02 DIAGNOSIS — E1165 Type 2 diabetes mellitus with hyperglycemia: Secondary | ICD-10-CM | POA: Diagnosis not present

## 2018-05-02 DIAGNOSIS — I10 Essential (primary) hypertension: Secondary | ICD-10-CM | POA: Diagnosis not present

## 2018-05-02 DIAGNOSIS — E7800 Pure hypercholesterolemia, unspecified: Secondary | ICD-10-CM | POA: Diagnosis not present

## 2018-05-02 DIAGNOSIS — E11319 Type 2 diabetes mellitus with unspecified diabetic retinopathy without macular edema: Secondary | ICD-10-CM | POA: Diagnosis not present

## 2018-05-02 MED ORDER — INSULIN LISPRO 100 UNIT/ML (KWIKPEN): 10.0000 [IU] | PEN_INJECTOR | Freq: Three times a day (TID) | SUBCUTANEOUS | 11 refills | Status: DC

## 2018-05-02 NOTE — Progress Notes (Addendum)
S:     Chief Complaint  Patient presents with  . Medication Management    Diabetes    Patient arrives in goof  Presents for diabetes evaluation, education, and management at the request of Dr. Darrick Huntsman at their last appointment on 04/04/2018. At that time, metformin was maximized to 1000 mg BID and Humalog was added. Invokana was discontinued d/t intolerability. The cost of a FreeStyle Libre sensor was investigated; unfortunately, the cost would be too much even with his [employer] plan.   Patient reports diabetes was diagnosed in 1995   Family/Social History: works on Designer, television/film set at the OR; patient fasts for religious reasons at certain times throughout the year, though notes the next holiday is not until next April  Insurance coverage/medication affordability: [facility] Employees  Patient reports adherence with medications.  Current diabetes medications include: metformin 1000 mg BID, Lantus 40 units daily, Humalog 5 units with meals Current hypertension medications include: lisinopril 10 mg daily   Patient reports one overnight hypoglycemic event; he woke up hungry and checked SMBG, result of 74. Patient denies hyperglycemic symptoms including polyuria, polydipsia, polyphagia, nocturia, neuropathy, blurred vision.   Patient reported dietary habits: Eats 2 meals/day Breakfast: Does not each breakfast Lunch: 3 pm; chicken, vegetables, bread; sometimes fruits Dinner: 9 pm; same as above Snacks: Mixed nuts Drinks: Energy drink; diet sodas; water   Patient reported exercise habits: walking, very active at work   O:  Physical Exam  Constitutional: He appears well-developed and well-nourished.  Vitals reviewed.  Review of Systems  All other systems reviewed and are negative.  Lab Results  Component Value Date   HGBA1C 7.9 (A) 04/04/2018   Vitals:   05/02/18 1323  BP: 122/84  Pulse: 73    Basic Metabolic Panel BMP Latest Ref Rng & Units 02/02/2018 03/29/2017  12/21/2016  Glucose 70 - 99 mg/dL 557(D) 220(U) 542(H)  BUN 6 - 23 mg/dL 11 12 13   Creatinine 0.40 - 1.50 mg/dL 0.62 3.76 2.83  Sodium 135 - 145 mEq/L 135 136 134(L)  Potassium 3.5 - 5.1 mEq/L 4.6 4.0 3.8  Chloride 96 - 112 mEq/L 100 102 101  CO2 19 - 32 mEq/L 28 29 27   Calcium 8.4 - 10.5 mg/dL 9.7 9.3 9.2    Lipid Panel     Component Value Date/Time   CHOL 201 (H) 02/02/2018 1708   TRIG 193.0 (H) 02/02/2018 1708   HDL 45.40 02/02/2018 1708   CHOLHDL 4 02/02/2018 1708   VLDL 38.6 02/02/2018 1708   LDLCALC 117 (H) 02/02/2018 1708   LDLDIRECT 116.0 12/21/2016 1134    Fasting SMBG: 97 this morning; lowest was 75  2 hour post-prandial/random SMBG: 150s-200s.  Clinical ASCVD: No ;  ASCVD risk factors: age 67-75, family 10 year ASCVD risk: <15%  A/P: Following discussion and approval by Dr. Darrick Huntsman, the following medication changes were made:   #Diabetes - Currently uncontrolled, most recent A1c 7.9% on 04/04/2018, worsened from 9.1% on 02/02/2018; Goal A1c <8% Patient denies hypoglycemic events. Patient reports adherence with medication. Control is suboptimal d/t basal/bolus insulin mismatch. Appropriate to increase bolus insulin requirement at this time. - Increase Humalog to 10 units with meals. Asked patient to check SMBG 2 hours after lunch or supper every day - Continue Lantus 40 units and metformin 1000 mg BID - Discussed appropriate carbohydrate intake for meals and snacks, and appropriate insulin administration techniques  #Hypertension currently controlled. Goal BP <140/90 Patient reports adherence with medication.  - Continue  lisinopril 10 mg dialy  #ASCVD risk - primary prevention in patient aged 54-75 with DM, baseline LDL 70-189; ASCVD risk <20%; moderate intensity statin indicated; most recent LDL uncontrolled, >100 mg/dL - Continue atorvastatin 80 mg daily - Moving forward, consider addition of ezetimibe daily to target LDL <100 mg/dL for primary prevention with  diabetes  Written patient instructions provided.  Total time in face to face counseling 30 minutes.    Follow up in Pharmacist Clinic Visit 2-3 weeks.   Randy Simmondsatherine E Jamont Grant, PharmD PGY2 Ambulatory Care Pharmacy Resident Phone: [phone]   I have reviewed the above information and agree with above.   Duncan Dulleresa Tullo, MD

## 2018-05-02 NOTE — Assessment & Plan Note (Signed)
#  Diabetes - Currently uncontrolled, most recent A1c 7.9% on 04/04/2018, worsened from 9.1% on 02/02/2018; Goal A1c <8% Patient denies hypoglycemic events. Patient reports adherence with medication. Control is suboptimal d/t basal/bolus insulin mismatch. Appropriate to increase bolus insulin requirement at this time. - Increase Humalog to 10 units with meals. Asked patient to check SMBG 2 hours after lunch or supper every day - Continue Lantus 40 units and metformin 1000 mg BID - Discussed appropriate carbohydrate intake for meals and snacks, and appropriate insulin administration techniques

## 2018-05-02 NOTE — Assessment & Plan Note (Signed)
#  Hypertension currently controlled. Goal BP <140/90 Patient reports adherence with medication.  - Continue lisinopril 10 mg dialy

## 2018-05-02 NOTE — Assessment & Plan Note (Signed)
#  ASCVD risk - primary prevention in patient aged 54-75 with DM, baseline LDL 70-189; ASCVD risk <20%; moderate intensity statin indicated; most recent LDL uncontrolled, >100 mg/dL - Continue atorvastatin 80 mg daily - Moving forward, consider addition of ezetimibe daily to target LDL <100 mg/dL for primary prevention with diabetes

## 2018-05-02 NOTE — Patient Instructions (Addendum)
It was great to see you today!   We are going to make one medication change today: Increase Humalog to 10 units with meals. Only use the Humalog if you are going to eat a meal, it is not needed with snacks.   Continue Lantus 40 units daily and metformin 1000 mg twice daily.   For the next few weeks, please check blood sugar twice daily: 1) fasting, before you eat or drink anything in the morning and 2) 2 hours after a meal, alternating between lunch and dinner.   Please bring your blood sugar meter to our next appointment.  Schedule follow up with me in 2-3 weeks. Feel free to call with any questions!   Catie Feliz Beamravis, PharmD

## 2018-05-23 ENCOUNTER — Ambulatory Visit: Payer: 59 | Admitting: Pharmacist

## 2018-06-28 ENCOUNTER — Other Ambulatory Visit: Payer: Self-pay | Admitting: Internal Medicine

## 2018-07-18 ENCOUNTER — Ambulatory Visit: Payer: 59 | Admitting: Internal Medicine

## 2018-07-18 DIAGNOSIS — Z0289 Encounter for other administrative examinations: Secondary | ICD-10-CM

## 2018-08-22 ENCOUNTER — Telehealth: Payer: Self-pay

## 2018-08-22 NOTE — Telephone Encounter (Signed)
PA for [place] has been submitted on covermymeds.

## 2018-09-20 NOTE — Telephone Encounter (Signed)
PA has been approved through 08/26/2019.

## 2018-09-26 ENCOUNTER — Telehealth: Payer: Self-pay | Admitting: Internal Medicine

## 2018-09-26 MED ORDER — INSULIN LISPRO (1 UNIT DIAL) 100 UNIT/ML (KWIKPEN): 10.0000 [IU] | PEN_INJECTOR | Freq: Three times a day (TID) | SUBCUTANEOUS | 2 refills | Status: DC

## 2018-09-26 NOTE — Telephone Encounter (Signed)
Pt needs a refill for insulin lispro (HUMALOG KWIKPEN) 100 UNIT/ML KiwkPen.  Pt has one pen left for about two three days. Pharmacy is [facility] - [place], [place] - [address]   Thank you

## 2018-09-26 NOTE — Telephone Encounter (Signed)
Medication has been refilled.

## 2018-10-07 ENCOUNTER — Other Ambulatory Visit: Payer: Self-pay | Admitting: Internal Medicine

## 2018-10-07 MED ORDER — TELMISARTAN 20 MG PO TABS: 20.0000 mg | ORAL_TABLET | Freq: Every day | ORAL | 5 refills | Status: DC

## 2018-10-07 NOTE — Telephone Encounter (Signed)
Refilled: 12/22/2017 Last OV: 05/02/2018 Next OV: 10/17/2018

## 2018-10-07 NOTE — Progress Notes (Signed)
Lisinopril discontinued.  telmisartan prescribed

## 2018-10-07 NOTE — Telephone Encounter (Signed)
I am changing his lisinopril to a different medication called telmisartan.  Lisinopril has been causing very serious reactions in patients regardless of how long they have tolerated it, so I will no longer prescribe it.

## 2018-10-07 NOTE — Telephone Encounter (Signed)
Patient was informed.  Patient understood and no questions, comments, or concerns at this time.  

## 2018-10-17 ENCOUNTER — Encounter: Payer: Self-pay | Admitting: Internal Medicine

## 2018-10-17 ENCOUNTER — Ambulatory Visit: Payer: 59 | Admitting: Internal Medicine

## 2018-10-17 VITALS — BP 146/84 | HR 81 | Temp 98.2°F | Resp 15 | Ht 67.0 in | Wt 161.8 lb

## 2018-10-17 DIAGNOSIS — E11319 Type 2 diabetes mellitus with unspecified diabetic retinopathy without macular edema: Secondary | ICD-10-CM

## 2018-10-17 DIAGNOSIS — E1165 Type 2 diabetes mellitus with hyperglycemia: Secondary | ICD-10-CM

## 2018-10-17 DIAGNOSIS — I10 Essential (primary) hypertension: Secondary | ICD-10-CM

## 2018-10-17 DIAGNOSIS — E782 Mixed hyperlipidemia: Secondary | ICD-10-CM

## 2018-10-17 DIAGNOSIS — IMO0002 Reserved for concepts with insufficient information to code with codable children: Secondary | ICD-10-CM

## 2018-10-17 LAB — COMPREHENSIVE METABOLIC PANEL
ALK PHOS: 69 U/L (ref 39–117)
ALT: 31 U/L (ref 0–53)
AST: 19 U/L (ref 0–37)
Albumin: 4.4 g/dL (ref 3.5–5.2)
BILIRUBIN TOTAL: 0.3 mg/dL (ref 0.2–1.2)
BUN: 12 mg/dL (ref 6–23)
CO2: 25 mEq/L (ref 19–32)
CREATININE: 0.66 mg/dL (ref 0.40–1.50)
Calcium: 9.2 mg/dL (ref 8.4–10.5)
Chloride: 100 mEq/L (ref 96–112)
GFR: 125.5 mL/min (ref >60.00)
GLUCOSE: 118 mg/dL — AB (ref 70–99)
Potassium: 3.9 mEq/L (ref 3.5–5.1)
Sodium: 135 mEq/L (ref 135–145)
TOTAL PROTEIN: 7.3 g/dL (ref 6.0–8.3)

## 2018-10-17 LAB — LIPID PANEL
CHOL/HDL RATIO: 5
Cholesterol: 203 mg/dL — ABNORMAL HIGH (ref 0–200)
HDL: 44.6 mg/dL (ref >39.00)
NonHDL: 158.22
TRIGLYCERIDES: 255 mg/dL — AB (ref 0.0–149.0)
VLDL: 51 mg/dL — ABNORMAL HIGH (ref 0.0–40.0)

## 2018-10-17 LAB — MICROALBUMIN / CREATININE URINE RATIO
CREATININE, U: 87.3 mg/dL
MICROALB/CREAT RATIO: 2 mg/g (ref 0.0–30.0)
Microalb, Ur: 1.8 mg/dL (ref 0.0–1.9)

## 2018-10-17 LAB — LDL CHOLESTEROL, DIRECT: LDL DIRECT: 127 mg/dL

## 2018-10-17 LAB — HEMOGLOBIN A1C: Hgb A1c MFr Bld: 8 % — ABNORMAL HIGH (ref 4.6–6.5)

## 2018-10-17 NOTE — Progress Notes (Signed)
Subjective:  Patient ID: Randy Grant, male    DOB: [birth year]  Age: 53 y.o. 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 Diagnoses of Mixed hyperlipidemia and HYPERTENSION, BENIGN ESSENTIAL were also pertinent to this visit.  HPI6 Randy Grant presents for 6 month follow up on diabetes.  Patient has no complaints today.  Patient is following a low glycemic index diet 50% of the time and taking all prescribed medications regularly without side effects.  His highest post prandial was 240 and 365 in the last month,  about 50% are at goal .  Has been indulging in sugar in his which he has noted raised  Using 40 units lantus and 10 units before meals of quick acting . Patient is exercising about 3 times per week and intentionally trying to lose weight .  Patient has had an eye exam in the last 12 months and checks feet regularly for signs of infection.  Patient does not walk barefoot outside,  And denies an numbness tingling or burning in feet. Patient is up to date on all recommended vaccinations   Outpatient Medications Prior to Visit  Medication Sig Dispense Refill  . ACCU-CHEK GUIDE test strip USE AS INSTRUCTED 100 each 5  . aspirin 81 MG tablet Take 81 mg by mouth daily.      Marland Kitchen atorvastatin (LIPITOR) 80 MG tablet Take 1 tablet (80 mg total) by mouth daily. 90 tablet 1  . B-D ULTRAFINE III SHORT PEN 31G X 8 MM MISC TEST BLOOD SUGARS 2 TIMES DAILY 100 each 6  . BAYER MICROLET LANCETS lancets USE AS DIRECTED 100 each 5  . docusate sodium (COLACE) 50 MG capsule Take 2 capsules (100 mg total) by mouth daily at 3 pm. 60180 capsule 3  . glucose blood (ONE TOUCH ULTRA TEST) test strip Use as instructed three times daily to check blood sugars 100 each 12  . Insulin Glargine (LANTUS SOLOSTAR) 100 UNIT/ML Solostar Pen Inject 40 Units into the skin daily at 10 pm. 15 mL 5  . insulin lispro (HUMALOG KWIKPEN) 100 UNIT/ML KwikPen  Inject 0.1 mLs (10 Units total) into the skin 3 (three) times daily. 15 mL 2  . Insulin Pen Needle (B-D ULTRAFINE III SHORT PEN) 31G X 8 MM MISC Test blood sugars 2 times daily 100 each 5  . metFORMIN (GLUCOPHAGE-XR) 500 MG 24 hr tablet Take 2 tablets (1,000 mg total) by mouth 2 (two) times daily. 360 tablet 2  . Multiple Vitamin (MULTIVITAMIN) tablet Take 1 tablet by mouth daily.      Marland Kitchen telmisartan (MICARDIS) 20 MG tablet Take 1 tablet (20 mg total) by mouth daily. Before bedtime 30 tablet 5   No facility-administered medications prior to visit.     Review of Systems;  Patient denies headache, fevers, malaise, unintentional weight loss, skin rash, eye pain, sinus congestion and sinus pain, sore throat, dysphagia,  hemoptysis , cough, dyspnea, wheezing, chest pain, palpitations, orthopnea, edema, abdominal pain, nausea, melena, diarrhea, constipation, flank pain, dysuria, hematuria, urinary  Frequency, nocturia, numbness, tingling, seizures,  Focal weakness, Loss of consciousness,  Tremor, insomnia, depression, anxiety, and suicidal ideation.      Objective:  BP (!) 146/84 (BP Location: Left Arm, Patient Position: Sitting, Cuff Size: Normal)   Pulse 81   Temp 98.2 F (36.8 C) (Oral)   Resp 15   Ht 5\' 7"  (1.702 m)   Wt 161 lb 12.8 oz (73.4 kg)  SpO2 99%   BMI 25.34 kg/m   BP Readings from Last 3 Encounters:  10/17/18 (!) 146/84  05/02/18 122/84  04/04/18 114/70    Wt Readings from Last 3 Encounters:  10/17/18 161 lb 12.8 oz (73.4 kg)  05/02/18 157 lb 9.6 oz (71.5 kg)  04/04/18 154 lb 9.6 oz (70.1 kg)    General appearance: alert, cooperative and appears stated age Ears: normal TM's and external ear canals both ears Throat: lips, mucosa, and tongue normal; teeth and gums normal Neck: no adenopathy, no carotid bruit, supple, symmetrical, trachea midline and thyroid not enlarged, symmetric, no tenderness/mass/nodules Back: symmetric, no curvature. ROM normal. No CVA  tenderness. Lungs: clear to auscultation bilaterally Heart: regular rate and rhythm, S1, S2 normal, no murmur, click, rub or gallop Abdomen: soft, non-tender; bowel sounds normal; no masses,  no organomegaly Pulses: 2+ and symmetric Skin: Skin color, texture, turgor normal. No rashes or lesions Lymph nodes: Cervical, supraclavicular, and axillary nodes normal.  Lab Results  Component Value Date   HGBA1C 8.0 (H) 10/17/2018   HGBA1C 7.9 (A) 04/04/2018   HGBA1C 9.1 (A) 02/02/2018    Lab Results  Component Value Date   CREATININE 0.66 10/17/2018   CREATININE 0.94 02/02/2018   CREATININE 0.68 03/29/2017    Lab Results  Component Value Date   WBC 4.6 02/02/2018   HGB 15.5 02/02/2018   HCT 46.7 02/02/2018   PLT 288.0 02/02/2018   GLUCOSE 118 (H) 10/17/2018   CHOL 203 (H) 10/17/2018   TRIG 255.0 (H) 10/17/2018   HDL 44.60 10/17/2018   LDLDIRECT 127.0 10/17/2018   LDLCALC 117 (H) 02/02/2018   ALT 31 10/17/2018   AST 19 10/17/2018   NA 135 10/17/2018   K 3.9 10/17/2018   CL 100 10/17/2018   CREATININE 0.66 10/17/2018   BUN 12 10/17/2018   CO2 25 10/17/2018   TSH 1.16 03/29/2017   PSA 0.67 02/02/2018   HGBA1C 8.0 (H) 10/17/2018   MICROALBUR 1.8 10/17/2018     Assessment & Plan:   Problem List Items Addressed This Visit    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  Advised to increase evening mealtime insulin to 12 units and check pst prandial evening CBG and the following AM fasting sugar.   Lab Results  Component Value Date   HGBA1C 8.0 (H) 10/17/2018   Lab Results  Component Value Date   MICROALBUR 1.8 10/17/2018          Relevant Orders   Comprehensive metabolic panel (Completed)   Hemoglobin A1c (Completed)   Microalbumin / creatinine urine ratio (Completed)   Hyperlipidemia    LDL is not at goal on maximal dose of atorvastatin .  Recommending the addition of zetia.   Lab Results  Component Value Date    CHOL 203 (H) 10/17/2018   HDL 44.60 10/17/2018   LDLCALC 117 (H) 02/02/2018   LDLDIRECT 127.0 10/17/2018   TRIG 255.0 (H) 10/17/2018   CHOLHDL 5 10/17/2018         Relevant Medications   ezetimibe (ZETIA) 10 MG tablet   Other Relevant Orders   Lipid panel (Completed)   HYPERTENSION, BENIGN ESSENTIAL    he reports compliance with medication regimen  but has an elevated reading today in office.  he is not using NSAIDs daily.  Discussed goal of 120/70  (130/80 for patients over 70)  to preserve renal function.  he has been asked to check her  BP  at home and  submit readings for evaluation. Renal function, electrolytes and screen for proteinuria are all normal       Relevant Medications   ezetimibe (ZETIA) 10 MG tablet     A total of 25 minutes of face to face time was spent with patient more than half of which was spent in counselling about the above mentioned conditions  and coordination of care   I am having Randy Grant start on ezetimibe. I am also having him maintain his multivitamin, aspirin, glucose blood, Insulin Pen Needle, Bayer Microlet Lancets, docusate sodium, B-D ULTRAFINE III SHORT PEN, atorvastatin, Insulin Glargine, metFORMIN, Accu-Chek Guide, insulin lispro, and telmisartan.  Meds ordered this encounter  Medications  . ezetimibe (ZETIA) 10 MG tablet    Sig: Take 1 tablet (10 mg total) by mouth daily.    Dispense:  90 tablet    Refill:  1    There are no discontinued medications.  Follow-up: No follow-ups on file.   Sherlene Shams, MD

## 2018-10-17 NOTE — Patient Instructions (Addendum)
You need  To have your your tetanus-diptheria-pertussis vaccine (TDaP) if you have not had  It since 2010.  Let me know because  you can get it for less $$$ at a local pharmacy with the script I have provided you.   Let's target the evening and meal and increase the mealtime insulin to 12 units.  Check sugar at 8 -9 pm..  Also check the next morning BEFORE EATING

## 2018-10-18 MED ORDER — EZETIMIBE 10 MG PO TABS: 10.0000 mg | ORAL_TABLET | Freq: Every day | ORAL | 1 refills | Status: DC

## 2018-10-18 NOTE — Assessment & Plan Note (Signed)
he reports compliance with medication regimen  but has an elevated reading today in office.  he is not using NSAIDs daily.  Discussed goal of 120/70  (130/80 for patients over 70)  to preserve renal function.  he has been asked to check her  BP  at home and  submit readings for evaluation. Renal function, electrolytes and screen for proteinuria are all normal 

## 2018-10-18 NOTE — Assessment & Plan Note (Signed)
LDL is not at goal on maximal dose of atorvastatin .  Recommending the addition of zetia.   Lab Results  Component Value Date   CHOL 203 (H) 10/17/2018   HDL 44.60 10/17/2018   LDLCALC 117 (H) 02/02/2018   LDLDIRECT 127.0 10/17/2018   TRIG 255.0 (H) 10/17/2018   CHOLHDL 5 10/17/2018

## 2018-10-18 NOTE — Assessment & Plan Note (Signed)
Worsening control.  Advised to increase evening mealtime insulin to 12 units and check pst prandial evening CBG and the following AM fasting sugar.   Lab Results  Component Value Date   HGBA1C 8.0 (H) 10/17/2018   Lab Results  Component Value Date   MICROALBUR 1.8 10/17/2018

## 2018-10-21 ENCOUNTER — Other Ambulatory Visit: Payer: Self-pay | Admitting: Internal Medicine

## 2018-10-27 ENCOUNTER — Other Ambulatory Visit: Payer: Self-pay | Admitting: Internal Medicine

## 2018-10-27 MED ORDER — INSULIN LISPRO (1 UNIT DIAL) 100 UNIT/ML (KWIKPEN): 12.0000 [IU] | PEN_INJECTOR | Freq: Three times a day (TID) | SUBCUTANEOUS | 2 refills | Status: DC

## 2018-11-04 ENCOUNTER — Other Ambulatory Visit: Payer: Self-pay | Admitting: Internal Medicine

## 2018-11-07 MED FILL — UNIFINE PENTIPS 8MM 31G: 31G X 8 MM | 50 days supply | Qty: 100 | Fill #0 | Status: TO

## 2018-11-07 MED FILL — LANTUS SOLOSTAR 100 UNITS/M: 100 | 30 days supply | Qty: 15 | Fill #0 | Status: TO

## 2018-12-28 ENCOUNTER — Other Ambulatory Visit: Payer: Self-pay | Admitting: Internal Medicine

## 2019-01-03 ENCOUNTER — Telehealth: Payer: Self-pay | Admitting: Internal Medicine

## 2019-01-03 NOTE — Telephone Encounter (Signed)
I spoke with patient & confirmed that he did not want to do a virtual visit. He would like a "in-office" visit. Please let me know if there is a time that I can add him. I do not see anything this week. I also asked if he had any additional BP readings and he did not. He said that you may want to adjust his medications, that's why he wants to come in.

## 2019-01-03 NOTE — Telephone Encounter (Signed)
Tell him to STOP the telmisartan until he is seen.    You can add him on Thursday at noon

## 2019-01-03 NOTE — Telephone Encounter (Signed)
CorrectioN:  He wants a face to face .  So 1:00 or 4:15 on Friday NOT THursday

## 2019-01-03 NOTE — Telephone Encounter (Signed)
Pt was in office and wanted Dr. Darrick Huntsman to know that his BP is running 91/52, as of Friday. Would like to see her.

## 2019-01-03 NOTE — Telephone Encounter (Signed)
Pt aware and has been scheduled with Dr. Darrick Huntsman on Friday per her request

## 2019-01-04 NOTE — Telephone Encounter (Signed)
He is scheduled for Friday at 1:00

## 2019-01-06 ENCOUNTER — Other Ambulatory Visit: Payer: Self-pay

## 2019-01-06 ENCOUNTER — Encounter: Payer: Self-pay | Admitting: Internal Medicine

## 2019-01-06 ENCOUNTER — Ambulatory Visit: Payer: 59 | Admitting: Internal Medicine

## 2019-01-06 VITALS — BP 160/86 | HR 104 | Temp 98.3°F | Resp 14 | Ht 67.0 in | Wt 168.6 lb

## 2019-01-06 DIAGNOSIS — E782 Mixed hyperlipidemia: Secondary | ICD-10-CM

## 2019-01-06 DIAGNOSIS — I10 Essential (primary) hypertension: Secondary | ICD-10-CM

## 2019-01-06 DIAGNOSIS — E1165 Type 2 diabetes mellitus with hyperglycemia: Secondary | ICD-10-CM | POA: Diagnosis not present

## 2019-01-06 DIAGNOSIS — IMO0002 Reserved for concepts with insufficient information to code with codable children: Secondary | ICD-10-CM

## 2019-01-06 DIAGNOSIS — E11319 Type 2 diabetes mellitus with unspecified diabetic retinopathy without macular edema: Secondary | ICD-10-CM | POA: Diagnosis not present

## 2019-01-06 MED ORDER — EZETIMIBE 10 MG PO TABS: 10.0000 mg | ORAL_TABLET | Freq: Every day | ORAL | 1 refills | Status: DC

## 2019-01-06 NOTE — Patient Instructions (Addendum)
Resume the telmisartan at 20 mg daily  In the evening    Take it Around 7 pm or at least by midnight   A normal range for blood pressure is 100 to 130  /  70 to 80  If your readings  Are below 100,  Cut the tablet I hlaf for your next dose  (reduce dose to 10 mg )   Send me readings after one week   Your cholesterol was not at goal on atorvastatin alone in March so I ADDED ezitimibe .  Please confirm WHICH cholesterol medication you are taking currently (atorvastatin vs ezitimibe) .  We will recheck your cholesterol in a month   Please return for  fasting labs in late June.   I will request your vaccination record from [employer] to see if your tetanus shot is up to date  ALL of the optometrists at Dr [facility] office are good.,  I have been to all of them,  So go ahead and schedule your annual eye exam

## 2019-01-06 NOTE — Progress Notes (Signed)
Subjective:  Patient ID: Randy Grant, male    DOB: 11/26/1965  Age: 53 y.o. 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 Diagnoses of Mixed hyperlipidemia and HYPERTENSION, BENIGN ESSENTIAL were also pertinent to this visit.  HPI Randy Grant presents for management of medication induced hypotension.  Patient was prescribed telmisartan at last visit as an alternative to lisinopril 10 mg daily .  Several days ago he called with reports of hypotension and was advised to suspend the telmisartan several days ago,, since then his readings have not .  Fasting s 102 and post prandials 145 when taking 12 units tid and 40 units lantus at betime   He has stopped the atorvastatin (mistakenly) and say he is taking Zetia   Needs eye exan,   Foot exam  Done    Outpatient Medications Prior to Visit  Medication Sig Dispense Refill  . ACCU-CHEK GUIDE test strip USE AS INSTRUCTED 100 each 5  . aspirin 81 MG tablet Take 81 mg by mouth daily.      . B-D ULTRAFINE III SHORT PEN 31G X 8 MM MISC TEST BLOOD SUGARS 2 TIMES DAILY 100 each 6  . BAYER MICROLET LANCETS lancets USE AS DIRECTED 100 each 5  . glucose blood (ONE TOUCH ULTRA TEST) test strip Use as instructed three times daily to check blood sugars 100 each 12  . insulin lispro (HUMALOG KWIKPEN) 100 UNIT/ML KwikPen Inject 0.12 mLs (12 Units total) into the skin 3 (three) times daily. 15 mL 2  . Insulin Pen Needle (B-D ULTRAFINE III SHORT PEN) 31G X 8 MM MISC Test blood sugars 2 times daily 100 each 5  . LANTUS SOLOSTAR 100 UNIT/ML Solostar Pen INJECT 40 UNITS INTO THE SKIN DAILY AT 10 PM. 15 mL 5  . metFORMIN (GLUCOPHAGE-XR) 500 MG 24 hr tablet Take 2 tablets (1,000 mg total) by mouth 2 (two) times daily. 360 tablet 2  . telmisartan (MICARDIS) 20 MG tablet Take 1 tablet (20 mg total) by mouth daily. Before bedtime 30 tablet 5  . UNIFINE PENTIPS 31G X 8 MM MISC TEST BLOOD SUGARS  2 TIMES DAILY 100 each 6  . atorvastatin (LIPITOR) 80 MG tablet TAKE 1 TABLET BY MOUTH DAILY. 90 tablet 1  . docusate sodium (COLACE) 50 MG capsule Take 2 capsules (100 mg total) by mouth daily at 3 pm. (Patient not taking: Reported on 01/06/2019) 60180 capsule 3  . Multiple Vitamin (MULTIVITAMIN) tablet Take 1 tablet by mouth daily.      Marland Kitchen. ezetimibe (ZETIA) 10 MG tablet Take 1 tablet (10 mg total) by mouth daily. (Patient not taking: Reported on 01/06/2019) 90 tablet 1   No facility-administered medications prior to visit.     Review of Systems;  Patient denies headache, fevers, malaise, unintentional weight loss, skin rash, eye pain, sinus congestion and sinus pain, sore throat, dysphagia,  hemoptysis , cough, dyspnea, wheezing, chest pain, palpitations, orthopnea, edema, abdominal pain, nausea, melena, diarrhea, constipation, flank pain, dysuria, hematuria, urinary  Frequency, nocturia, numbness, tingling, seizures,  Focal weakness, Loss of consciousness,  Tremor, insomnia, depression, anxiety, and suicidal ideation.      Objective:  BP (!) 160/86 (BP Location: Left Arm, Patient Position: Sitting, Cuff Size: Normal)   Pulse (!) 104   Temp 98.3 F (36.8 C) (Oral)   Resp 14   Ht 5\' 7"  (1.702 m)   Wt 168 lb 9.6 oz (76.5 kg)   SpO2 99%  BMI 26.41 kg/m   BP Readings from Last 3 Encounters:  01/06/19 (!) 160/86  10/17/18 (!) 146/84  05/02/18 122/84    Wt Readings from Last 3 Encounters:  01/06/19 168 lb 9.6 oz (76.5 kg)  10/17/18 161 lb 12.8 oz (73.4 kg)  05/02/18 157 lb 9.6 oz (71.5 kg)    General appearance: alert, cooperative and appears stated age Ears: normal TM's and external ear canals both ears Throat: lips, mucosa, and tongue normal; teeth and gums normal Neck: no adenopathy, no carotid bruit, supple, symmetrical, trachea midline and thyroid not enlarged, symmetric, no tenderness/mass/nodules Back: symmetric, no curvature. ROM normal. No CVA tenderness. Lungs: clear  to auscultation bilaterally Heart: regular rate and rhythm, S1, S2 normal, no murmur, click, rub or gallop Abdomen: soft, non-tender; bowel sounds normal; no masses,  no organomegaly Pulses: 2+ and symmetric Skin: Skin color, texture, turgor normal. No rashes or lesions Lymph nodes: Cervical, supraclavicular, and axillary nodes normal.  Lab Results  Component Value Date   HGBA1C 8.0 (H) 10/17/2018   HGBA1C 7.9 (A) 04/04/2018   HGBA1C 9.1 (A) 02/02/2018    Lab Results  Component Value Date   CREATININE 0.66 10/17/2018   CREATININE 0.94 02/02/2018   CREATININE 0.68 03/29/2017    Lab Results  Component Value Date   WBC 4.6 02/02/2018   HGB 15.5 02/02/2018   HCT 46.7 02/02/2018   PLT 288.0 02/02/2018   GLUCOSE 118 (H) 10/17/2018   CHOL 203 (H) 10/17/2018   TRIG 255.0 (H) 10/17/2018   HDL 44.60 10/17/2018   LDLDIRECT 127.0 10/17/2018   LDLCALC 117 (H) 02/02/2018   ALT 31 10/17/2018   AST 19 10/17/2018   NA 135 10/17/2018   K 3.9 10/17/2018   CL 100 10/17/2018   CREATININE 0.66 10/17/2018   BUN 12 10/17/2018   CO2 25 10/17/2018   TSH 1.16 03/29/2017   PSA 0.67 02/02/2018   HGBA1C 8.0 (H) 10/17/2018   MICROALBUR 1.8 10/17/2018    Dg Hip Unilat With Pelvis 2-3 Views Right  Result Date: 10/21/2015 CLINICAL DATA:  Acute right hip pain. EXAM: DG HIP (WITH OR WITHOUT PELVIS) 2-3V RIGHT COMPARISON:  None. FINDINGS: There is no evidence of hip fracture or dislocation. There is no evidence of arthropathy or other focal bone abnormality. IMPRESSION: Normal right hip. Electronically Signed   By: Lupita Raider, M.D.   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   Lab Results  Component Value Date   HGBA1C 8.0 (H) 10/17/2018   Lab Results  Component Value Date   MICROALBUR 1.8  10/17/2018          Relevant Orders   Hemoglobin A1c   Comprehensive metabolic panel   Hyperlipidemia    He has stopped atorvastatin instead of combining with zetia.  Will repeat lipids in one month and readress       Relevant Medications   ezetimibe (ZETIA) 10 MG tablet   Other Relevant Orders   Lipid panel   HYPERTENSION, BENIGN ESSENTIAL    bp is elevated without telmisartan ; will resume medication in the evening        Relevant Medications   ezetimibe (ZETIA) 10 MG tablet      I have discontinued Zade E. Normoyle's atorvastatin. I  am also having him maintain his multivitamin, aspirin, glucose blood, Insulin Pen Needle, Bayer Microlet Lancets, docusate sodium, B-D ULTRAFINE III SHORT PEN, metFORMIN, Accu-Chek Guide, telmisartan, insulin lispro, Unifine Pentips, Lantus SoloStar, and ezetimibe.  Meds ordered this encounter  Medications  . ezetimibe (ZETIA) 10 MG tablet    Sig: Take 1 tablet (10 mg total) by mouth daily.    Dispense:  90 tablet    Refill:  1    Medications Discontinued During This Encounter  Medication Reason  . atorvastatin (LIPITOR) 80 MG tablet   . ezetimibe (ZETIA) 10 MG tablet Reorder    Follow-up: No follow-ups on file.   Sherlene Shams, MD

## 2019-01-08 NOTE — Assessment & Plan Note (Signed)
Improving control with increase in mealtime insulin to 12 units ad 40 units of Lantus.   Lab Results  Component Value Date   HGBA1C 8.0 (H) 10/17/2018   Lab Results  Component Value Date   MICROALBUR 1.8 10/17/2018

## 2019-01-08 NOTE — Assessment & Plan Note (Signed)
He has stopped atorvastatin instead of combining with zetia.  Will repeat lipids in one month and readress

## 2019-01-08 NOTE — Assessment & Plan Note (Addendum)
bp is elevated without telmisartan ; will resume medication in the evening

## 2019-02-06 ENCOUNTER — Other Ambulatory Visit: Payer: Self-pay

## 2019-02-06 ENCOUNTER — Other Ambulatory Visit (INDEPENDENT_AMBULATORY_CARE_PROVIDER_SITE_OTHER): Payer: 59

## 2019-02-06 DIAGNOSIS — E1165 Type 2 diabetes mellitus with hyperglycemia: Secondary | ICD-10-CM

## 2019-02-06 DIAGNOSIS — E782 Mixed hyperlipidemia: Secondary | ICD-10-CM | POA: Diagnosis not present

## 2019-02-06 DIAGNOSIS — E11319 Type 2 diabetes mellitus with unspecified diabetic retinopathy without macular edema: Secondary | ICD-10-CM | POA: Diagnosis not present

## 2019-02-06 DIAGNOSIS — IMO0002 Reserved for concepts with insufficient information to code with codable children: Secondary | ICD-10-CM

## 2019-02-06 LAB — COMPREHENSIVE METABOLIC PANEL
ALT: 21 U/L (ref 0–53)
AST: 15 U/L (ref 0–37)
Albumin: 3.9 g/dL (ref 3.5–5.2)
Alkaline Phosphatase: 62 U/L (ref 39–117)
BUN: 9 mg/dL (ref 6–23)
CO2: 25 mEq/L (ref 19–32)
Calcium: 8.4 mg/dL (ref 8.4–10.5)
Chloride: 101 mEq/L (ref 96–112)
Creatinine, Ser: 0.68 mg/dL (ref 0.40–1.50)
GFR: 121.11 mL/min (ref >60.00)
Glucose, Bld: 195 mg/dL — ABNORMAL HIGH (ref 70–99)
Potassium: 3.9 mEq/L (ref 3.5–5.1)
Sodium: 134 mEq/L — ABNORMAL LOW (ref 135–145)
Total Bilirubin: 0.4 mg/dL (ref 0.2–1.2)
Total Protein: 6.2 g/dL (ref 6.0–8.3)

## 2019-02-06 LAB — LIPID PANEL
Cholesterol: 260 mg/dL — ABNORMAL HIGH (ref 0–200)
HDL: 37.6 mg/dL — ABNORMAL LOW (ref >39.00)
Total CHOL/HDL Ratio: 7
Triglycerides: 472 mg/dL — ABNORMAL HIGH (ref 0.0–149.0)

## 2019-02-06 LAB — HEMOGLOBIN A1C: Hgb A1c MFr Bld: 8 % — ABNORMAL HIGH (ref 4.6–6.5)

## 2019-02-06 LAB — LDL CHOLESTEROL, DIRECT: Direct LDL: 149 mg/dL

## 2019-02-09 ENCOUNTER — Other Ambulatory Visit: Payer: Self-pay | Admitting: Internal Medicine

## 2019-02-09 MED ORDER — LANTUS SOLOSTAR 100 UNIT/ML ~~LOC~~ SOPN: 45.0000 [IU] | PEN_INJECTOR | Freq: Every day | SUBCUTANEOUS | 5 refills | Status: DC

## 2019-02-09 MED ORDER — INSULIN LISPRO (1 UNIT DIAL) 100 UNIT/ML (KWIKPEN): 14.0000 [IU] | PEN_INJECTOR | Freq: Three times a day (TID) | SUBCUTANEOUS | 2 refills | Status: DC

## 2019-02-14 ENCOUNTER — Other Ambulatory Visit: Payer: Self-pay | Admitting: Internal Medicine

## 2019-02-14 MED ORDER — FENOFIBRATE 160 MG PO TABS: 160.0000 mg | ORAL_TABLET | Freq: Every day | ORAL | 1 refills | Status: DC

## 2019-02-14 NOTE — Progress Notes (Signed)
tricor sent

## 2019-02-16 ENCOUNTER — Telehealth: Payer: Self-pay | Admitting: Internal Medicine

## 2019-02-16 NOTE — Telephone Encounter (Signed)
Copied from [place] [id number]. Topic: Quick Communication - Lab Results (Clinic Use ONLY) >> Feb 15, 2019  1:07 PM Adair Laundry, CMA wrote: Called patient to inform them of 02/09/2019 lab results. When patient returns call, triage nurse may disclose results.  [facility] may speak with pt.  Pt calling back for lab results.

## 2019-02-16 NOTE — Telephone Encounter (Signed)
Returned call to pt.  See result note 02/16/19.

## 2019-03-13 DIAGNOSIS — E113413 Type 2 diabetes mellitus with severe nonproliferative diabetic retinopathy with macular edema, bilateral: Secondary | ICD-10-CM | POA: Diagnosis not present

## 2019-03-13 DIAGNOSIS — H2513 Age-related nuclear cataract, bilateral: Secondary | ICD-10-CM | POA: Diagnosis not present

## 2019-03-13 DIAGNOSIS — H524 Presbyopia: Secondary | ICD-10-CM | POA: Diagnosis not present

## 2019-03-13 DIAGNOSIS — E083413 Diabetes mellitus due to underlying condition with severe nonproliferative diabetic retinopathy with macular edema, bilateral: Secondary | ICD-10-CM | POA: Diagnosis not present

## 2019-04-14 ENCOUNTER — Other Ambulatory Visit: Payer: Self-pay | Admitting: Internal Medicine

## 2019-07-03 ENCOUNTER — Other Ambulatory Visit: Payer: Self-pay | Admitting: Internal Medicine

## 2019-07-31 ENCOUNTER — Other Ambulatory Visit: Payer: Self-pay | Admitting: Internal Medicine

## 2019-08-14 ENCOUNTER — Other Ambulatory Visit: Payer: Self-pay

## 2019-08-14 ENCOUNTER — Emergency Department
Admission: EM | Admit: 2019-08-14 | Discharge: 2019-08-14 | Disposition: A | Discharge status: Home / Self Care | Payer: 59 | Attending: Emergency Medicine | Admitting: Emergency Medicine

## 2019-08-14 ENCOUNTER — Telehealth: Payer: Self-pay | Admitting: Internal Medicine

## 2019-08-14 DIAGNOSIS — Z794 Long term (current) use of insulin: Secondary | ICD-10-CM | POA: Insufficient documentation

## 2019-08-14 DIAGNOSIS — Z87891 Personal history of nicotine dependence: Secondary | ICD-10-CM | POA: Diagnosis not present

## 2019-08-14 DIAGNOSIS — H538 Other visual disturbances: Secondary | ICD-10-CM | POA: Diagnosis present

## 2019-08-14 DIAGNOSIS — Z79899 Other long term (current) drug therapy: Secondary | ICD-10-CM | POA: Diagnosis not present

## 2019-08-14 DIAGNOSIS — I10 Essential (primary) hypertension: Secondary | ICD-10-CM | POA: Diagnosis not present

## 2019-08-14 DIAGNOSIS — E119 Type 2 diabetes mellitus without complications: Secondary | ICD-10-CM | POA: Diagnosis not present

## 2019-08-14 DIAGNOSIS — Z7982 Long term (current) use of aspirin: Secondary | ICD-10-CM | POA: Diagnosis not present

## 2019-08-14 DIAGNOSIS — E113213 Type 2 diabetes mellitus with mild nonproliferative diabetic retinopathy with macular edema, bilateral: Secondary | ICD-10-CM | POA: Diagnosis not present

## 2019-08-14 LAB — CBC
HCT: 43.6 % (ref 39.0–52.0)
Hemoglobin: 14.7 g/dL (ref 13.0–17.0)
MCH: 26.6 pg (ref 26.0–34.0)
MCHC: 33.7 g/dL (ref 30.0–36.0)
MCV: 78.8 fL — ABNORMAL LOW (ref 80.0–100.0)
Platelets: 311 10*3/uL (ref 150–400)
RBC: 5.53 MIL/uL (ref 4.22–5.81)
RDW: 13.2 % (ref 11.5–15.5)
WBC: 5.1 10*3/uL (ref 4.0–10.5)
nRBC: 0 % (ref 0.0–0.2)

## 2019-08-14 LAB — COMPREHENSIVE METABOLIC PANEL
ALT: 26 U/L (ref 0–44)
AST: 21 U/L (ref 15–41)
Albumin: 4.7 g/dL (ref 3.5–5.0)
Alkaline Phosphatase: 54 U/L (ref 38–126)
Anion gap: 8 (ref 5–15)
BUN: 15 mg/dL (ref 6–20)
CO2: 25 mmol/L (ref 22–32)
Calcium: 9.6 mg/dL (ref 8.9–10.3)
Chloride: 102 mmol/L (ref 98–111)
Creatinine, Ser: 0.66 mg/dL (ref 0.61–1.24)
GFR calc Af Amer: >60 mL/min (ref >60)
GFR calc non Af Amer: >60 mL/min (ref >60)
Glucose, Bld: 197 mg/dL — ABNORMAL HIGH (ref 70–99)
Potassium: 4.1 mmol/L (ref 3.5–5.1)
Sodium: 135 mmol/L (ref 135–145)
Total Bilirubin: 0.7 mg/dL (ref 0.3–1.2)
Total Protein: 7.5 g/dL (ref 6.5–8.1)

## 2019-08-14 LAB — HM DIABETES EYE EXAM

## 2019-08-14 MED ORDER — SODIUM CHLORIDE 0.9% FLUSH: 3.0000 mL | Freq: Once | INTRAVENOUS | Status: DC

## 2019-08-14 NOTE — ED Notes (Signed)
Patient evaluated by Md discharged to home.

## 2019-08-14 NOTE — ED Notes (Signed)
Patient reports blurred vision x 1 week. Denies drainage or FB, reports feels like their is sand in his eyes, reports very itchy when he blinks at times and the other times feels like someone sticking a  Needle in his eye, sharp shooting pain comes and goes.

## 2019-08-14 NOTE — Telephone Encounter (Signed)
Patient came into office with complaint of Dizziness , blurrd vision , "Isee waves and feels like sand in my eyes", X 1 week. Patients pupils equal and reactive to light ,but patient complained light caused pain mor e in right eye then left.  Vital s BP 198/100 pulse 94 resp. 18 02 sats @ 99 on room air  T= 97.6  CBG= 237.  14 Units of humalog this morning and 44 units of lantus last night. Telmisartan  20 mg before bed last night.Temporal. Advised in office MD nurse recommendations were , patient to have wife drive him to ER  Office MD agreed.

## 2019-08-14 NOTE — Telephone Encounter (Signed)
Reviewed information.  Given blurred vision, elevated blood pressure, etc - agree with ER evaluation.    Dr Lorin Picket

## 2019-08-14 NOTE — ED Provider Notes (Signed)
[facility] Emergency Department Provider Note   ____________________________________________    I have reviewed the triage vital signs and the nursing notes.   HISTORY  Chief Complaint Hypertension    HPI Randy Grant is a 54 y.o. male with a history of high blood pressure, diabetes, hyperlipidemia who presents with complaints of high blood pressure.  Patient went to see his PCP because he has had intermittent blurry vision over the last 1 to 2 weeks and he was requesting referral to ophthalmology.  And his blood pressure was noted to be elevated and he was sent to the emergency department by nurse.  Patient denies chest pain.  No headache.  No nausea or vomiting.  Has been taking his medications as prescribed. Past Medical History:  Diagnosis Date  . Cataract 2017   left  . Diabetes mellitus   . GERD (gastroesophageal reflux disease)   . Hyperlipidemia   . Hypertension     Patient Active Problem List   Diagnosis Date Noted  . Anosmia 02/05/2018  . Constipation 12/20/2016  . Prostate cancer screening 10/17/2015  . Gastritis and gastroduodenitis 09/20/2014  . Encounter for preventive health examination 09/20/2014  . Need for immunization against malaria 01/20/2014  . Erectile dysfunction associated with type 2 diabetes mellitus ([place]) 01/20/2014  . Uncontrolled type 2 diabetes with retinopathy ([place]) 04/25/2007  . Hyperlipidemia 04/25/2007  . HYPERTENSION, BENIGN ESSENTIAL 04/25/1999    History reviewed. No pertinent surgical history.  Prior to Admission medications   Medication Sig Start Date End Date Taking? Authorizing Provider  ACCU-CHEK GUIDE test strip USE AS INSTRUCTED 08/01/19   Crecencio Mc, MD  aspirin 81 MG tablet Take 81 mg by mouth daily.      [provider]  B-D ULTRAFINE III SHORT PEN 31G X 8 MM MISC TEST BLOOD SUGARS 2 TIMES DAILY 10/11/17   Crecencio Mc, MD  BAYER MICROLET LANCETS lancets USE AS DIRECTED  11/20/16   Crecencio Mc, MD  docusate sodium (COLACE) 50 MG capsule Take 2 capsules (100 mg total) by mouth daily at 3 pm. Patient not taking: Reported on 01/06/2019 12/17/16   Crecencio Mc, MD  ezetimibe (ZETIA) 10 MG tablet Take 1 tablet (10 mg total) by mouth daily. 01/06/19   Crecencio Mc, MD  fenofibrate 160 MG tablet Take 1 tablet (160 mg total) by mouth at bedtime. 02/14/19   Crecencio Mc, MD  glucose blood (ONE TOUCH ULTRA TEST) test strip Use as instructed three times daily to check blood sugars 09/11/13   Crecencio Mc, MD  HUMALOG KWIKPEN 100 UNIT/ML KwikPen INJECT 14 UNITS TOTAL INTO THE SKIN 3 TIMES DAILY. 07/03/19   Crecencio Mc, MD  Insulin Glargine (LANTUS SOLOSTAR) 100 UNIT/ML Solostar Pen Inject 45 Units into the skin daily. 02/09/19   Crecencio Mc, MD  Insulin Pen Needle (B-D ULTRAFINE III SHORT PEN) 31G X 8 MM MISC Test blood sugars 2 times daily 01/17/14   Crecencio Mc, MD  metFORMIN (GLUCOPHAGE-XR) 500 MG 24 hr tablet TAKE 2 TABLETS (1,000 MG TOTAL) BY MOUTH 2 (TWO) TIMES DAILY. 04/14/19   Crecencio Mc, MD  Multiple Vitamin (MULTIVITAMIN) tablet Take 1 tablet by mouth daily.      [provider]  telmisartan (MICARDIS) 20 MG tablet TAKE 1 TABLET BY MOUTH DAILY BEFORE BEDTIME 07/03/19   Crecencio Mc, MD  UNIFINE PENTIPS 31G X 8 MM MISC TEST BLOOD SUGARS 2 TIMES DAILY  11/07/18   Sherlene Shams, MD     Allergies Patient has no known allergies.  Family History  Problem Relation Age of Onset  . Diabetes Mother   . Hyperlipidemia Father   . Diabetes Father   . Diabetes Sister   . Diabetes Sister     Social History Social History   Tobacco Use  . Smoking status: Former Smoker    Quit date: 06/25/2001    Years since quitting: 18.1  . Smokeless tobacco: Never Used  Substance Use Topics  . Alcohol use: No    Alcohol/week: 0.0 standard drinks  . Drug use: No    Review of Systems  Constitutional: No fever/chills Eyes: As above, currently  vision is normal ENT: No sore throat. Cardiovascular: Denies chest pain. Respiratory: Denies shortness of breath. Gastrointestinal: No abdominal pain.   Genitourinary: Negative for dysuria. Musculoskeletal: Negative for back pain. Skin: Negative for rash. Neurological: Negative for headaches or weakness   ____________________________________________   PHYSICAL EXAM:  VITAL SIGNS: ED Triage Vitals  Enc Vitals Group     BP 08/14/19 1147 (!) 187/85     Pulse Rate 08/14/19 1147 78     Resp 08/14/19 1147 18     Temp 08/14/19 1147 98.3 F (36.8 C)     Temp Source 08/14/19 1147 Oral     SpO2 08/14/19 1147 100 %     Weight 08/14/19 1148 76.2 kg (168 lb)     Height 08/14/19 1148 1.676 m (5\' 6" )     Head Circumference --      Peak Flow --      Pain Score 08/14/19 1148 3     Pain Loc --      Pain Edu? --      Excl. in GC? --     Constitutional: Alert and oriented.   Nose: No congestion/rhinnorhea. Mouth/Throat: Mucous membranes are moist.    Cardiovascular: Normal rate, regular rhythm.   Good peripheral circulation. Respiratory: Normal respiratory effort.  No retractions. Lungs CTAB. Gastrointestinal: Soft and nontender. No distention.  No CVA tenderness.  Musculoskeletal: No lower extremity tenderness nor edema.  Warm and well perfused Neurologic:  Normal speech and language. No gross focal neurologic deficits are appreciated.  Skin:  Skin is warm, dry and intact. No rash noted. Psychiatric: Mood and affect are normal. Speech and behavior are normal.  ____________________________________________   LABS (all labs ordered are listed, but only abnormal results are displayed)  Labs Reviewed  COMPREHENSIVE METABOLIC PANEL - Abnormal; Notable for the following components:      Result Value   Glucose, Bld 197 (*)    All other components within normal limits  CBC - Abnormal; Notable for the following components:   MCV 78.8 (*)    All other components within normal limits    ____________________________________________  EKG  None ____________________________________________  RADIOLOGY  None ____________________________________________   PROCEDURES  Procedure(s) performed: No  Procedures   Critical Care performed: No ____________________________________________   INITIAL IMPRESSION / ASSESSMENT AND PLAN / ED COURSE  Pertinent labs & imaging results that were available during my care of the patient were reviewed by me and considered in my medical decision making (see chart for details).  Patient well-appearing and asymptomatic at this time.  Elevated blood pressure but history of hypertension.  Reports compliance with his medication.  Review of medical records demonstrates frequent elevated blood pressures over the last year, blood pressure is trending down here checked while in the room and  systolic 166.  Recommend outpatient follow-up with PCP, will provide referral to refer the patient for ophthalmology follow-up as requested    ____________________________________________   FINAL CLINICAL IMPRESSION(S) / ED DIAGNOSES  Final diagnoses:  Essential hypertension        Note:  This document was prepared using Dragon voice recognition software and may include unintentional dictation errors.   Jene Every, MD 08/14/19 [phone]

## 2019-08-14 NOTE — ED Triage Notes (Signed)
Pt states he has had changes in the his vision "like blurry" for the past week with some dizziness. States he went to his PCP today and his b/p was elevated so they referred to the ED for tx. Pt he took his regular meds this morning.

## 2019-08-21 DIAGNOSIS — E113213 Type 2 diabetes mellitus with mild nonproliferative diabetic retinopathy with macular edema, bilateral: Secondary | ICD-10-CM | POA: Diagnosis not present

## 2019-08-22 ENCOUNTER — Other Ambulatory Visit: Payer: Self-pay

## 2019-08-22 ENCOUNTER — Telehealth: Payer: Self-pay | Admitting: Internal Medicine

## 2019-08-22 DIAGNOSIS — E782 Mixed hyperlipidemia: Secondary | ICD-10-CM

## 2019-08-22 DIAGNOSIS — I10 Essential (primary) hypertension: Secondary | ICD-10-CM

## 2019-08-22 DIAGNOSIS — E11319 Type 2 diabetes mellitus with unspecified diabetic retinopathy without macular edema: Secondary | ICD-10-CM

## 2019-08-22 DIAGNOSIS — IMO0002 Reserved for concepts with insufficient information to code with codable children: Secondary | ICD-10-CM

## 2019-08-22 MED ORDER — TELMISARTAN 40 MG PO TABS: 40.0000 mg | ORAL_TABLET | Freq: Every day | ORAL | 0 refills | Status: DC

## 2019-08-22 NOTE — Telephone Encounter (Signed)
He needs to increase his telmisartan dose to 40 mg daily ,.  Using the 20 mg tablets that he has.  New rx for higher dose sent to [facility] pharmacy   Needs RN AND LAB visit one week after dose change to recheck BP and get labs for diabetes follow up

## 2019-08-22 NOTE — Telephone Encounter (Signed)
Patient seen in ER for elevated BP with vision changes. on 08/14/19 was advised to make ER follow up appointment walked in to day wanting appt for increased BP reading, Attained BP while in office for virtual appt on Monday the 18th, 2021 BP left arm 168/86 pulse 89 , 02 sats 99 room air, weight 170.6,  Patient denied any symptoms at this time. Patient seeing Opthalmology  For the vision change he experiencing receiving injection interocular but could tell nurse medication or DX.

## 2019-08-22 NOTE — Telephone Encounter (Signed)
Patient walked into the office wanting an appt. BP 175/90. No available appts.

## 2019-08-23 NOTE — Telephone Encounter (Signed)
Patient advised of new medication dose and nurse visit scheduled with LB for one week.

## 2019-08-25 DIAGNOSIS — E113213 Type 2 diabetes mellitus with mild nonproliferative diabetic retinopathy with macular edema, bilateral: Secondary | ICD-10-CM | POA: Diagnosis not present

## 2019-08-28 ENCOUNTER — Encounter: Payer: Self-pay | Admitting: Internal Medicine

## 2019-08-28 ENCOUNTER — Other Ambulatory Visit: Payer: Self-pay

## 2019-08-28 ENCOUNTER — Ambulatory Visit: Payer: 59 | Admitting: Internal Medicine

## 2019-08-28 ENCOUNTER — Ambulatory Visit (INDEPENDENT_AMBULATORY_CARE_PROVIDER_SITE_OTHER): Payer: 59 | Admitting: Internal Medicine

## 2019-08-28 DIAGNOSIS — E11319 Type 2 diabetes mellitus with unspecified diabetic retinopathy without macular edema: Secondary | ICD-10-CM | POA: Diagnosis not present

## 2019-08-28 DIAGNOSIS — I10 Essential (primary) hypertension: Secondary | ICD-10-CM | POA: Diagnosis not present

## 2019-08-28 DIAGNOSIS — E1165 Type 2 diabetes mellitus with hyperglycemia: Secondary | ICD-10-CM | POA: Diagnosis not present

## 2019-08-28 DIAGNOSIS — IMO0002 Reserved for concepts with insufficient information to code with codable children: Secondary | ICD-10-CM

## 2019-08-28 NOTE — Patient Instructions (Signed)
Increase your humalog dose to 16 units before lunch'   Purchase a Blood pressure monitor and bring it with you to your  appt on Thrusday   Goal BP is 130/80 or less

## 2019-08-28 NOTE — Progress Notes (Signed)
Virtual Visit converted to telephone  Note  This visit type was conducted due to national recommendations for restrictions regarding the COVID-19 pandemic (e.g. social distancing).  This format is felt to be most appropriate for this patient at this time.  All issues noted in this document were discussed and addressed.  No physical exam was performed (except for noted visual exam findings with Video Visits).   I attempted to connect with@ on 08/28/19 at 10:30 AM EST by a video enabled telemedicine application and verified that I am speaking with the correct person using two identifiers. Interactive audio and video telecommunications were initially established beteen this provider and patient, however ultimately failed, due to patient having technical difficulties. We continued and completed visit with audio only  and verified that I am speaking with the correct person using two identifiers  Location patient: home Location provider: work or home office Persons participating in the virtual visit: patient, provider  I discussed the limitations, risks, security and privacy concerns of performing an evaluation and management service by telephone and the availability of in person appointments. I also discussed with the patient that there may be a patient responsible charge related to this service. The patient expressed understanding and agreed to proceed.   Reason for visit: Er followup   HPI:  54 yr old [place] male with T2DM, uncontrolled hypertension recently sent to  ER for hypertensive urgency after walking into office with BP 200 systolic and vision changes reported.  He was not seen but sent to ER for urgent management and imaging.  Was seen ,  Not treated since repeat BP was 160 systolic,  Sent directly to ophthalmology and treated with intraocular injections for unclear diagnosis (retinopathy history )   By Dr Dimas Aguas at [facility]. Has follow up in mid February.  Vision has improved slightly  , lines are less wavy and now able to read.   BP is "not good" so far  But has not checked since his dose of telmisartan was increased last week to 40 mg .   has follow up appt this week with RN and lab  for BP check . Does not own a BP machine  T2DM:  He reprts BS 90 to 102 fasting.  Does not check BS after a meal often,  Available infrequent  readings 180 to 200 . Takes lantus 45 and humalog 14 units tid  Misses the morning dose of humalog about  1-2 times per week    ROS: See pertinent positives and negatives per HPI.  Past Medical History:  Diagnosis Date  . Cataract 2017   left  . Diabetes mellitus   . GERD (gastroesophageal reflux disease)   . Hyperlipidemia   . Hypertension     No past surgical history on file.  Family History  Problem Relation Age of Onset  . Diabetes Mother   . Hyperlipidemia Father   . Diabetes Father   . Diabetes Sister   . Diabetes Sister     SOCIAL HX:  reports that he quit smoking about 18 years ago. He has never used smokeless tobacco. He reports that he does not drink alcohol or use drugs.   Current Outpatient Medications:  .  ACCU-CHEK GUIDE test strip, USE AS INSTRUCTED, Disp: 100 strip, Rfl: 5 .  aspirin 81 MG tablet, Take 81 mg by mouth daily.  , Disp: , Rfl:  .  B-D ULTRAFINE III SHORT PEN 31G X 8 MM MISC, TEST BLOOD SUGARS 2 TIMES DAILY,  Disp: 100 each, Rfl: 6 .  BAYER MICROLET LANCETS lancets, USE AS DIRECTED, Disp: 100 each, Rfl: 5 .  ezetimibe (ZETIA) 10 MG tablet, Take 1 tablet (10 mg total) by mouth daily., Disp: 90 tablet, Rfl: 1 .  fenofibrate 160 MG tablet, Take 1 tablet (160 mg total) by mouth at bedtime., Disp: 90 tablet, Rfl: 1 .  glucose blood (ONE TOUCH ULTRA TEST) test strip, Use as instructed three times daily to check blood sugars, Disp: 100 each, Rfl: 12 .  HUMALOG KWIKPEN 100 UNIT/ML KwikPen, INJECT 14 UNITS TOTAL INTO THE SKIN 3 TIMES DAILY., Disp: 15 mL, Rfl: 2 .  Insulin Glargine (LANTUS SOLOSTAR) 100 UNIT/ML  Solostar Pen, Inject 45 Units into the skin daily., Disp: 15 mL, Rfl: 5 .  Insulin Pen Needle (B-D ULTRAFINE III SHORT PEN) 31G X 8 MM MISC, Test blood sugars 2 times daily, Disp: 100 each, Rfl: 5 .  metFORMIN (GLUCOPHAGE-XR) 500 MG 24 hr tablet, TAKE 2 TABLETS (1,000 MG TOTAL) BY MOUTH 2 (TWO) TIMES DAILY., Disp: 360 tablet, Rfl: 1 .  Multiple Vitamin (MULTIVITAMIN) tablet, Take 1 tablet by mouth daily.  , Disp: , Rfl:  .  telmisartan (MICARDIS) 40 MG tablet, Take 1 tablet (40 mg total) by mouth daily., Disp: 90 tablet, Rfl: 0 .  UNIFINE PENTIPS 31G X 8 MM MISC, TEST BLOOD SUGARS 2 TIMES DAILY, Disp: 100 each, Rfl: 6 .  docusate sodium (COLACE) 50 MG capsule, Take 2 capsules (100 mg total) by mouth daily at 3 pm. (Patient not taking: Reported on 01/06/2019), Disp: 60180 capsule, Rfl: 3  EXAM:  VITALS per patient if applicable:  GENERAL: alert, oriented, appears well and in no acute distress  HEENT: atraumatic, conjunttiva clear, no obvious abnormalities on inspection of external nose and ears  NECK: normal movements of the head and neck  LUNGS: on inspection no signs of respiratory distress, breathing rate appears normal, no obvious gross SOB, gasping or wheezing  CV: no obvious cyanosis  MS: moves all visible extremities without noticeable abnormality  PSYCH/NEURO: pleasant and cooperative, no obvious depression or anxiety, speech and thought processing grossly intact  ASSESSMENT AND PLAN:  Discussed the following assessment and plan:  Malignant hypertension  Uncontrolled type 2 diabetes with retinopathy ([place])  Malignant hypertension He does not own a BP monitor.  His telmisartan dose was increased to 40 mg last week after presenting with systolic of 956 and retinal bleed.  He has an RN visit this week for BP recheck and has been strongly advised to purchase a home machine   Uncontrolled type 2 diabetes with retinopathy ([place]) Remains uncontrolled due to work schedule and  culture barriers including diet.  Advised to return for overdue labs and increase lunchtime humalog dose to 16 units . Continue 45 units lantus and 14 units humalog pre brekfast and dinner   Lab Results  Component Value Date   HGBA1C 8.0 (H) 02/06/2019       I discussed the assessment and treatment plan with the patient. The patient was provided an opportunity to ask questions and all were answered. The patient agreed with the plan and demonstrated an understanding of the instructions.   The patient was advised to call back or seek an in-person evaluation if the symptoms worsen or if the condition fails to improve as anticipated.  I provided  25 minutes of non-face-to-face time during this encounter reviewing patient's current problems, recent ER visit m  and coordination  of care .  Helene Kelp  Ether Griffins, MD

## 2019-08-29 NOTE — Assessment & Plan Note (Signed)
Remains uncontrolled due to work schedule and culture barriers including diet.  Advised to return for overdue labs and increase lunchtime humalog dose to 16 units . Continue 45 units lantus and 14 units humalog pre brekfast and dinner   Lab Results  Component Value Date   HGBA1C 8.0 (H) 02/06/2019

## 2019-08-29 NOTE — Assessment & Plan Note (Signed)
He does not own a BP monitor.  His telmisartan dose was increased to 40 mg last week after presenting with systolic of 200 and retinal bleed.  He has an RN visit this week for BP recheck and has been strongly advised to purchase a home machine

## 2019-08-31 ENCOUNTER — Other Ambulatory Visit: Payer: Self-pay

## 2019-08-31 ENCOUNTER — Telehealth: Payer: Self-pay

## 2019-08-31 ENCOUNTER — Other Ambulatory Visit (INDEPENDENT_AMBULATORY_CARE_PROVIDER_SITE_OTHER): Payer: 59

## 2019-08-31 ENCOUNTER — Ambulatory Visit (INDEPENDENT_AMBULATORY_CARE_PROVIDER_SITE_OTHER): Payer: 59

## 2019-08-31 VITALS — BP 100/60 | HR 68

## 2019-08-31 DIAGNOSIS — E11319 Type 2 diabetes mellitus with unspecified diabetic retinopathy without macular edema: Secondary | ICD-10-CM

## 2019-08-31 DIAGNOSIS — E1165 Type 2 diabetes mellitus with hyperglycemia: Secondary | ICD-10-CM | POA: Diagnosis not present

## 2019-08-31 DIAGNOSIS — IMO0002 Reserved for concepts with insufficient information to code with codable children: Secondary | ICD-10-CM

## 2019-08-31 DIAGNOSIS — I10 Essential (primary) hypertension: Secondary | ICD-10-CM

## 2019-08-31 DIAGNOSIS — E782 Mixed hyperlipidemia: Secondary | ICD-10-CM | POA: Diagnosis not present

## 2019-08-31 LAB — LIPID PANEL
Cholesterol: 226 mg/dL — ABNORMAL HIGH (ref 0–200)
HDL: 42.9 mg/dL (ref >39.00)
LDL Cholesterol: 157 mg/dL — ABNORMAL HIGH (ref 0–99)
NonHDL: 183.25
Total CHOL/HDL Ratio: 5
Triglycerides: 131 mg/dL (ref 0.0–149.0)
VLDL: 26.2 mg/dL (ref 0.0–40.0)

## 2019-08-31 LAB — COMPREHENSIVE METABOLIC PANEL
ALT: 26 U/L (ref 0–53)
AST: 20 U/L (ref 0–37)
Albumin: 4.2 g/dL (ref 3.5–5.2)
Alkaline Phosphatase: 54 U/L (ref 39–117)
BUN: 15 mg/dL (ref 6–23)
CO2: 30 mEq/L (ref 19–32)
Calcium: 9.7 mg/dL (ref 8.4–10.5)
Chloride: 101 mEq/L (ref 96–112)
Creatinine, Ser: 0.93 mg/dL (ref 0.40–1.50)
GFR: 84.21 mL/min (ref >60.00)
Glucose, Bld: 199 mg/dL — ABNORMAL HIGH (ref 70–99)
Potassium: 4.2 mEq/L (ref 3.5–5.1)
Sodium: 137 mEq/L (ref 135–145)
Total Bilirubin: 0.4 mg/dL (ref 0.2–1.2)
Total Protein: 7 g/dL (ref 6.0–8.3)

## 2019-08-31 LAB — MICROALBUMIN / CREATININE URINE RATIO
Creatinine,U: 88.1 mg/dL
Microalb Creat Ratio: 1.7 mg/g (ref 0.0–30.0)
Microalb, Ur: 1.5 mg/dL (ref 0.0–1.9)

## 2019-08-31 LAB — HEMOGLOBIN A1C: Hgb A1c MFr Bld: 8.3 % — ABNORMAL HIGH (ref 4.6–6.5)

## 2019-08-31 NOTE — Telephone Encounter (Signed)
Great,  bp is perfect

## 2019-08-31 NOTE — Progress Notes (Addendum)
Patient here for nurse visit BP check per order from [facility]   Patient reports compliance with prescribed BP medications: yes  Last dose of BP medication: today   BP Readings from Last 3 Encounters:  08/31/19 100/60  08/28/19 (!) 168/86  08/14/19 (!) 175/89   Pulse Readings from Last 3 Encounters:  08/31/19 68  08/28/19 89  08/14/19 90    Per Dr. Lorin Picket   Patient was here for BP reading and stated he is not having any dizziness, lightheadedness or any symptoms at all he states he is taking only one BP medication (Micardis) and he scheduled at the front upon leaving a 2 week f/up with Dr. Darrick Huntsman.    Patient verbalized understanding of instructions.   Allean Found, CMA   Reviewed.  Pt taking micardis.  Blood pressure much improved.  Will continue current blood pressure medication.  F/u with Dr Darrick Huntsman.    Dr Lorin Picket

## 2019-09-03 ENCOUNTER — Other Ambulatory Visit: Payer: Self-pay | Admitting: Internal Medicine

## 2019-09-03 DIAGNOSIS — IMO0002 Reserved for concepts with insufficient information to code with codable children: Secondary | ICD-10-CM

## 2019-09-03 DIAGNOSIS — E11319 Type 2 diabetes mellitus with unspecified diabetic retinopathy without macular edema: Secondary | ICD-10-CM

## 2019-09-04 ENCOUNTER — Telehealth: Payer: Self-pay | Admitting: Internal Medicine

## 2019-09-04 NOTE — Chronic Care Management (AMB) (Signed)
  Care Management   Note  09/04/2019 Name: TRAMAR BRUECKNER MRN: [id number] DOB: 01/30/1966  [name] is a 54 y.o. year old male who is a primary care patient of Sherlene Shams, MD. I reached out to [name] by phone today in response to a referral sent by Mr. [facility] health plan.    Mr. Krenz was given information about care management services today including:  1. Care management services include personalized support from designated clinical staff supervised by his physician, including individualized plan of care and coordination with other care providers 2. 24/7 contact phone numbers for assistance for urgent and routine care needs. 3. The patient may stop care management services at any time by phone call to the office staff.  Patient agreed to services and verbal consent obtained.   Follow up plan: Telephone appointment with care management team member scheduled for:10/09/2019  Elisha Ponder, LPN Health Advisor, Embedded Care Coordination [facility] Health Care Management ??[email or web address] ??[phone]

## 2019-09-14 ENCOUNTER — Other Ambulatory Visit: Payer: Self-pay

## 2019-09-15 ENCOUNTER — Encounter: Payer: Self-pay | Admitting: Internal Medicine

## 2019-09-15 ENCOUNTER — Ambulatory Visit: Payer: 59 | Admitting: Internal Medicine

## 2019-09-15 DIAGNOSIS — E1165 Type 2 diabetes mellitus with hyperglycemia: Secondary | ICD-10-CM | POA: Diagnosis not present

## 2019-09-15 DIAGNOSIS — E782 Mixed hyperlipidemia: Secondary | ICD-10-CM | POA: Diagnosis not present

## 2019-09-15 DIAGNOSIS — E11319 Type 2 diabetes mellitus with unspecified diabetic retinopathy without macular edema: Secondary | ICD-10-CM

## 2019-09-15 DIAGNOSIS — I10 Essential (primary) hypertension: Secondary | ICD-10-CM

## 2019-09-15 DIAGNOSIS — IMO0002 Reserved for concepts with insufficient information to code with codable children: Secondary | ICD-10-CM

## 2019-09-15 MED ORDER — HYDROCHLOROTHIAZIDE 25 MG PO TABS: 25.0000 mg | ORAL_TABLET | Freq: Every day | ORAL | 3 refills | Status: DC

## 2019-09-15 MED ORDER — INSULIN LISPRO (1 UNIT DIAL) 100 UNIT/ML (KWIKPEN): 16.0000 [IU] | PEN_INJECTOR | Freq: Three times a day (TID) | SUBCUTANEOUS | 2 refills | Status: DC

## 2019-09-15 MED ORDER — LANTUS SOLOSTAR 100 UNIT/ML ~~LOC~~ SOPN: 48.0000 [IU] | PEN_INJECTOR | Freq: Every day | SUBCUTANEOUS | 5 refills | Status: DC

## 2019-09-15 MED ORDER — DOCUSATE SODIUM 50 MG PO CAPS: 100.0000 mg | ORAL_CAPSULE | Freq: Every day | ORAL | 3 refills | Status: DC

## 2019-09-15 NOTE — Patient Instructions (Addendum)
Please start taking HCTZ 25 mg once daily with your telmisartan for your blood pressure  [place] blood pressue is 130/80 or less (ideally 120/70)  Increase your Lantus dose to 48 units  Bring your blood sugar log or glucometer to your appt with Catie Feliz Beam    Your insulin may be cheaper without insurance using [facility] or looking at [facility].  I have printed the prescriptions for you  Catie may be able to help you get the meds for less $$$

## 2019-09-15 NOTE — Progress Notes (Signed)
Subjective:  Patient ID: Randy Grant, male    DOB: Mar 02, 1966  Age: 54 y.o. 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  HPI Randy Grant presents for follow up on uncontrolled hypertension, diabetes   This visit occurred during the SARS-CoV-2 public health emergency.  Safety protocols were in place, including screening questions prior to the visit, additional usage of staff PPE, and extensive cleaning of exam room while observing appropriate contact time as indicated for disinfecting solutions.    HTN:  He has been taking telmisartan at 2 pm when we wakes up  (he works third shift).   40 mg dose.  Home readings recalled 150/85. Not sure if accurate,  Has not been calibrated   T2DM:  Sugars have been "good"  But did [employer] log of sugars .  Checks fastings have been 150 and under.  Does not check post prandials.  Taking lantus 45 units and and meal time insulin  16 units Humalog tid     Outpatient Medications Prior to Visit  Medication Sig Dispense Refill  . ACCU-CHEK GUIDE test strip USE AS INSTRUCTED 100 strip 5  . aspirin 81 MG tablet Take 81 mg by mouth daily.      . B-D ULTRAFINE III SHORT PEN 31G X 8 MM MISC TEST BLOOD SUGARS 2 TIMES DAILY 100 each 6  . BAYER MICROLET LANCETS lancets USE AS DIRECTED 100 each 5  . ezetimibe (ZETIA) 10 MG tablet Take 1 tablet (10 mg total) by mouth daily. 90 tablet 1  . fenofibrate 160 MG tablet Take 1 tablet (160 mg total) by mouth at bedtime. 90 tablet 1  . glucose blood (ONE TOUCH ULTRA TEST) test strip Use as instructed three times daily to check blood sugars 100 each 12  . Insulin Pen Needle (B-D ULTRAFINE III SHORT PEN) 31G X 8 MM MISC Test blood sugars 2 times daily 100 each 5  . metFORMIN (GLUCOPHAGE-XR) 500 MG 24 hr tablet TAKE 2 TABLETS (1,000 MG TOTAL) BY MOUTH 2 (TWO) TIMES DAILY. 360 tablet 1  . Multiple  Vitamin (MULTIVITAMIN) tablet Take 1 tablet by mouth daily.      Marland Kitchen telmisartan (MICARDIS) 40 MG tablet Take 1 tablet (40 mg total) by mouth daily. 90 tablet 0  . UNIFINE PENTIPS 31G X 8 MM MISC TEST BLOOD SUGARS 2 TIMES DAILY 100 each 6  . docusate sodium (COLACE) 50 MG capsule Take 2 capsules (100 mg total) by mouth daily at 3 pm. 60180 capsule 3  . HUMALOG KWIKPEN 100 UNIT/ML KwikPen INJECT 14 UNITS TOTAL INTO THE SKIN 3 TIMES DAILY. 15 mL 2  . Insulin Glargine (LANTUS SOLOSTAR) 100 UNIT/ML Solostar Pen Inject 45 Units into the skin daily. 15 mL 5   No facility-administered medications prior to visit.    Review of Systems;  Patient denies headache, fevers, malaise, unintentional weight loss, skin rash, eye pain, sinus congestion and sinus pain, sore throat, dysphagia,  hemoptysis , cough, dyspnea, wheezing, chest pain, palpitations, orthopnea, edema, abdominal pain, nausea, melena, diarrhea, constipation, flank pain, dysuria, hematuria, urinary  Frequency, nocturia, numbness, tingling, seizures,  Focal weakness, Loss of consciousness,  Tremor, insomnia, depression, anxiety, and suicidal ideation.      Objective:  BP (!) 144/80 (BP Location: Left Arm, Patient Position: Sitting, Cuff Size: Normal)   Pulse 72   Temp (!) 95.1 F (35.1 C) (Temporal)   Resp  15   Ht 5\' 6"  (1.676 m)   Wt 172 lb 6.4 oz (78.2 kg)   SpO2 99%   BMI 27.83 kg/m   BP Readings from Last 3 Encounters:  09/15/19 (!) 144/80  08/31/19 100/60  08/28/19 (!) 168/86    Wt Readings from Last 3 Encounters:  09/15/19 172 lb 6.4 oz (78.2 kg)  08/28/19 170 lb 9.6 oz (77.4 kg)  08/14/19 168 lb (76.2 kg)    General appearance: alert, cooperative and appears stated age Ears: normal TM's and external ear canals both ears Throat: lips, mucosa, and tongue normal; teeth and gums normal Neck: no adenopathy, no carotid bruit, supple, symmetrical, trachea midline and thyroid not enlarged, symmetric, no  tenderness/mass/nodules Back: symmetric, no curvature. ROM normal. No CVA tenderness. Lungs: clear to auscultation bilaterally Heart: regular rate and rhythm, S1, S2 normal, no murmur, click, rub or gallop Abdomen: soft, non-tender; bowel sounds normal; no masses,  no organomegaly Pulses: 2+ and symmetric Skin: Skin color, texture, turgor normal. No rashes or lesions Lymph nodes: Cervical, supraclavicular, and axillary nodes normal. Foot exam:  Nails are well trimmed,  No callouses,  Sensation intact to microfilament  Lab Results  Component Value Date   HGBA1C 8.3 (H) 08/31/2019   HGBA1C 8.0 (H) 02/06/2019   HGBA1C 8.0 (H) 10/17/2018    Lab Results  Component Value Date   CREATININE 0.93 08/31/2019   CREATININE 0.66 08/14/2019   CREATININE 0.68 02/06/2019    Lab Results  Component Value Date   WBC 5.1 08/14/2019   HGB 14.7 08/14/2019   HCT 43.6 08/14/2019   PLT 311 08/14/2019   GLUCOSE 199 (H) 08/31/2019   CHOL 226 (H) 08/31/2019   TRIG 131.0 08/31/2019   HDL 42.90 08/31/2019   LDLDIRECT 149.0 02/06/2019   LDLCALC 157 (H) 08/31/2019   ALT 26 08/31/2019   AST 20 08/31/2019   NA 137 08/31/2019   K 4.2 08/31/2019   CL 101 08/31/2019   CREATININE 0.93 08/31/2019   BUN 15 08/31/2019   CO2 30 08/31/2019   TSH 1.16 03/29/2017   PSA 0.67 02/02/2018   HGBA1C 8.3 (H) 08/31/2019   MICROALBUR 1.5 08/31/2019    No results found.  Assessment & Plan:   Problem List Items Addressed This Visit      Unprioritized   Uncontrolled type 2 diabetes with retinopathy ([place])    Remains uncontrolled despite reports of "good readings."  Taking 4 insulin doses daily (basal plus mealtime).  He would benefit from the [facility] monitor to improve monitoring frequency at meal time. Also needs assistance with meds as his copays have increased significantly  Increase lantus to 48 units,  Follow up with Catie.   Lab Results  Component Value Date   HGBA1C 8.3 (H) 08/31/2019          Relevant Medications   Insulin Glargine (LANTUS SOLOSTAR) 100 UNIT/ML Solostar Pen   insulin lispro (HUMALOG KWIKPEN) 100 UNIT/ML KwikPen   atorvastatin (LIPITOR) 20 MG tablet   Malignant hypertension    Not at goal on 40 mg telmisartan.  Adding hctz 25 mg ..  Return for BP check with RN      Relevant Medications   hydrochlorothiazide (HYDRODIURIL) 25 MG tablet   atorvastatin (LIPITOR) 20 MG tablet   Hyperlipidemia    He has stopped atorvastatin instead of combining with zetia.  Lipids are  Not at goal  Resume atorvastatin  Lab Results  Component Value Date   CHOL 226 (H) 08/31/2019  HDL 42.90 08/31/2019   LDLCALC 157 (H) 08/31/2019   LDLDIRECT 149.0 02/06/2019   TRIG 131.0 08/31/2019   CHOLHDL 5 08/31/2019         Relevant Medications   hydrochlorothiazide (HYDRODIURIL) 25 MG tablet   atorvastatin (LIPITOR) 20 MG tablet      I have changed Randy Grant's HumaLOG KwikPen to insulin lispro. I have also changed his docusate sodium and Lantus SoloStar. I am also having him start on hydrochlorothiazide and atorvastatin. Additionally, I am having him maintain his multivitamin, aspirin, glucose blood, Insulin Pen Needle, Bayer Microlet Lancets, B-D ULTRAFINE III SHORT PEN, Unifine Pentips, ezetimibe, fenofibrate, metFORMIN, Accu-Chek Guide, and telmisartan.  Meds ordered this encounter  Medications  . hydrochlorothiazide (HYDRODIURIL) 25 MG tablet    Sig: Take 1 tablet (25 mg total) by mouth daily.    Dispense:  60 tablet    Refill:  3  . docusate sodium (COLACE) 50 MG capsule    Sig: Take 2 capsules (100 mg total) by mouth daily in the afternoon.    Dispense:  180 capsule    Refill:  3  . Insulin Glargine (LANTUS SOLOSTAR) 100 UNIT/ML Solostar Pen    Sig: Inject 48 Units into the skin daily.    Dispense:  15 mL    Refill:  5  . insulin lispro (HUMALOG KWIKPEN) 100 UNIT/ML KwikPen    Sig: Inject 0.16 mLs (16 Units total) into the skin 3 (three) times daily.     Dispense:  15 mL    Refill:  2  . atorvastatin (LIPITOR) 20 MG tablet    Sig: Take 1 tablet (20 mg total) by mouth daily.    Dispense:  90 tablet    Refill:  3    Medications Discontinued During This Encounter  Medication Reason  . docusate sodium (COLACE) 50 MG capsule   . Insulin Glargine (LANTUS SOLOSTAR) 100 UNIT/ML Solostar Pen   . HUMALOG KWIKPEN 100 UNIT/ML KwikPen     Follow-up: Return in about 3 months (around 12/13/2019).   Sherlene Shams, MD

## 2019-09-17 MED ORDER — ATORVASTATIN CALCIUM 20 MG PO TABS: 20.0000 mg | ORAL_TABLET | Freq: Every day | ORAL | 3 refills | Status: DC

## 2019-09-17 NOTE — Assessment & Plan Note (Addendum)
Remains uncontrolled despite reports of "good readings."  Taking 4 insulin doses daily (basal plus mealtime).  He would benefit from the [facility] monitor to improve monitoring frequency at meal time. Also needs assistance with meds as his copays have increased significantly  Increase lantus to 48 units,  Follow up with Catie.   Lab Results  Component Value Date   HGBA1C 8.3 (H) 08/31/2019

## 2019-09-17 NOTE — Assessment & Plan Note (Addendum)
He has stopped atorvastatin instead of combining with zetia.  Lipids are  Not at goal  Resume atorvastatin  Lab Results  Component Value Date   CHOL 226 (H) 08/31/2019   HDL 42.90 08/31/2019   LDLCALC 157 (H) 08/31/2019   LDLDIRECT 149.0 02/06/2019   TRIG 131.0 08/31/2019   CHOLHDL 5 08/31/2019

## 2019-09-17 NOTE — Assessment & Plan Note (Signed)
Not at goal on 40 mg telmisartan.  Adding hctz 25 mg ..  Return for BP check with RN

## 2019-09-25 DIAGNOSIS — E113213 Type 2 diabetes mellitus with mild nonproliferative diabetic retinopathy with macular edema, bilateral: Secondary | ICD-10-CM | POA: Diagnosis not present

## 2019-10-09 ENCOUNTER — Telehealth: Payer: 59

## 2019-10-09 ENCOUNTER — Ambulatory Visit: Payer: Self-pay | Admitting: Pharmacist

## 2019-10-09 NOTE — Chronic Care Management (AMB) (Addendum)
  Chronic Care Management   Note  10/09/2019 Name: SAN LOHMEYER MRN: [id number] DOB: [birth year]  [name] is a 54 y.o. year old male who is a primary care patient of Tullo, Mar Daring, MD. The CCM team was consulted for assistance with chronic disease management and care coordination needs.    Contacted patient for medication management review. Left HIPAA compliant message for patient to return my call at his convenience.   Of note, will encourage patient to contact [facility] Diabetes program, as they should be able to help with medication costs AND can help obtain DexCom G6 CGM for him. He can enroll or learn more at [email or web address].info/ or call [phone]  Follow up plan: - Will collaborate w/ Care Guide to outreach to reschedule visit w/ me.   Catie Feliz Beam, PharmD, [place], CPP Clinical Pharmacist [facility] [phone]   I have reviewed the above information and agree with above.   Duncan Dull, MD

## 2019-10-10 ENCOUNTER — Telehealth: Payer: Self-pay | Admitting: Internal Medicine

## 2019-10-10 NOTE — Chronic Care Management (AMB) (Signed)
  Care Management   Note  10/10/2019 Name: Randy Grant MRN: [id number] DOB: Apr 24, 1966  Randy Grant is a 54 y.o. year old male who is a primary care patient of Darrick Huntsman, Mar Daring, MD and is actively engaged with the care management team. I reached out to Randy Grant by phone today to assist with re-scheduling an initial visit with the Pharmacist  Follow up plan: Unsuccessful telephone outreach attempt made. A HIPPA compliant phone message was left for the patient providing contact information and requesting a return call.  The care management team will reach out to the patient again over the next 7 days.  If patient returns call to provider office, please advise to call Embedded Care Management Care Guide Penne Lash  at [phone]  Penne Lash, RMA Care Guide, Embedded Care Coordination [facility]  [place], [place] [address] Direct Dial: [phone] [email or web address] Website: [email or web address]

## 2019-10-16 NOTE — Chronic Care Management (AMB) (Signed)
  Care Management   Note  10/16/2019 Name: Randy Grant MRN: [id number] DOB: 14-Oct-1965  Randy Grant is a 54 y.o. year old male who is a primary care patient of Darrick Huntsman, Mar Daring, MD and is actively engaged with the care management team. I reached out to Randy Grant by phone today to assist with re-scheduling an initial visit with the Pharmacist  Follow up plan: Second Unsuccessful telephone outreach attempt made. A HIPPA compliant phone message was left for the patient providing contact information and requesting a return call.  The care management team will reach out to the patient again over the next 7 days.  If patient returns call to provider office, please advise to call Embedded Care Management Care Guide Penne Lash  at [phone]  Penne Lash, RMA Care Guide, Embedded Care Coordination [facility]  [place], [place] [address] Direct Dial: [phone] [email or web address] Website: [email or web address]

## 2019-10-19 NOTE — Chronic Care Management (AMB) (Signed)
°  Care Management   Note  10/19/2019 Name: [name] MRN: [id number] DOB: 05/22/1966  Randy Grant is a 54 y.o. year old male who is a primary care patient of Tullo, Mar Daring, MD and is actively engaged with the care management team. I reached out to Randy Grant by phone today to assist with re-scheduling an initial visit with the Pharmacist  Follow up plan: Telephone appointment with care management team member scheduled for:11/13/2019  Penne Lash, RMA Care Guide, Embedded Care Coordination [facility]  [place], [place] [address] Direct Dial: [phone] [email or web address] Website: [email or web address]

## 2019-11-13 ENCOUNTER — Ambulatory Visit: Payer: Self-pay | Admitting: Pharmacist

## 2019-11-13 ENCOUNTER — Telehealth: Payer: 59

## 2019-11-13 NOTE — Patient Instructions (Addendum)
Randy Grant,   We had a phone visit scheduled today at 11 am. I attempted to call, but could not leave a voicemail.   If you are still interested in working together regarding your medications as Dr. Darrick Huntsman recommended, please feel free to give me a call back, or call the office to schedule an appointment with me.   Additionally, I do recommend you reach out to the Diabetes Program available through [facility] that will help with glucose monitoring supplies and the cost of your medications. Enroll or Learn more at [email or web address].info/ , or you can call [phone].   Thanks!  Catie Feliz Beam, PharmD [phone]

## 2019-11-13 NOTE — Chronic Care Management (AMB) (Signed)
  Chronic Care Management   Note  11/13/2019 Name: Randy Grant MRN: [id number] DOB: [birth year]  Randy Grant is a 54 y.o. year old male who is a primary care patient of Tullo, Mar Daring, MD. The CCM team was consulted for assistance with chronic disease management and care coordination needs.    Attempted to contact patient as scheduled for medication management review. Contacted x 2; unable to leave voicemail as mailbox was full.   Will mail patient my contact information; will collaborate w/ patient if he contacts me to express interested in CCM program.  Catie Feliz Beam, PharmD, [place], CPP Clinical Pharmacist [facility] [employer] [phone]

## 2019-12-11 ENCOUNTER — Other Ambulatory Visit: Payer: Self-pay | Admitting: Internal Medicine

## 2019-12-12 ENCOUNTER — Telehealth: Payer: Self-pay

## 2019-12-12 MED ORDER — BASAGLAR KWIKPEN 100 UNIT/ML ~~LOC~~ SOPN: 45.0000 [IU] | PEN_INJECTOR | Freq: Every day | SUBCUTANEOUS | 1 refills | Status: DC

## 2019-12-12 NOTE — Telephone Encounter (Signed)
[facility] pharmacy called and stated that Lantus is no longer covered under [employer]. They are wanting to know if it is okay if we send in a new rx for Basaglar since it is the preferred for [employer] insurance.

## 2019-12-12 NOTE — Telephone Encounter (Signed)
Done

## 2019-12-15 ENCOUNTER — Ambulatory Visit: Payer: 59 | Admitting: Internal Medicine

## 2019-12-25 ENCOUNTER — Other Ambulatory Visit: Payer: Self-pay

## 2019-12-27 ENCOUNTER — Encounter: Payer: Self-pay | Admitting: Internal Medicine

## 2019-12-27 ENCOUNTER — Ambulatory Visit: Payer: 59 | Admitting: Internal Medicine

## 2019-12-27 ENCOUNTER — Other Ambulatory Visit: Payer: Self-pay

## 2019-12-27 VITALS — BP 138/80 | HR 75 | Temp 97.4°F | Resp 16 | Ht 66.0 in | Wt 166.6 lb

## 2019-12-27 DIAGNOSIS — R0602 Shortness of breath: Secondary | ICD-10-CM

## 2019-12-27 DIAGNOSIS — E1165 Type 2 diabetes mellitus with hyperglycemia: Secondary | ICD-10-CM | POA: Diagnosis not present

## 2019-12-27 DIAGNOSIS — E782 Mixed hyperlipidemia: Secondary | ICD-10-CM | POA: Diagnosis not present

## 2019-12-27 DIAGNOSIS — E11319 Type 2 diabetes mellitus with unspecified diabetic retinopathy without macular edema: Secondary | ICD-10-CM | POA: Diagnosis not present

## 2019-12-27 DIAGNOSIS — IMO0002 Reserved for concepts with insufficient information to code with codable children: Secondary | ICD-10-CM

## 2019-12-27 NOTE — Progress Notes (Signed)
Subjective:  Patient ID: Randy Grant, male    DOB: 01-11-1966  Age: 54 y.o. 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 Diagnoses of SOBOE (shortness of breath on exertion) and Mixed hyperlipidemia were also pertinent to this visit.  HPI Randy Grant presents for follow up on uncontrolled type 2 DM with retinopathy,  Hypertension , and hyperlipidemia.  This visit occurred during the SARS-CoV-2 public health emergency.  Safety protocols were in place, including screening questions prior to the visit, additional usage of staff PPE, and extensive cleaning of exam room while observing appropriate contact time as indicated for disinfecting solutions.    Patient has received both doses of the available COVID 19 vaccine without complications.  Patient continues to mask when outside of the home except when walking in yard or at safe distances from others .  Patient denies any change in mood or development of unhealthy behaviors resuting from the pandemic's restriction of activities and socialization.     He feels generally well, is walking  several times per week and checking blood sugars once daily at variable times.  BS have been around  160 fasting ,  Which is most of the time since he is observing Ramadan.    Denies any recent hypoglyemic events.  Taking his medications as directed. Not Following a carbohydrate modified diet due to his cultural cuisine. . Denies numbness, burning and tingling of extremities. Appetite is good.  However he reports Exertional tachycardia brought on by : running up a single flight of stairs that does not occur with walking . He denies chest pain,  But states that it makes him uncomfortable,  And he is clearly worried about heart disease as the cause.  He does not smoke    Hypertension: patient checks blood pressure twice weekly at home.  Readings have been for the most part about  140/80 at rest  . Patient is following a reduce salt diet most days and is taking medications as prescribed   Outpatient Medications Prior to Visit  Medication Sig Dispense Refill  . ACCU-CHEK GUIDE test strip USE AS INSTRUCTED 100 strip 5  . aspirin 81 MG tablet Take 81 mg by mouth daily.      Marland Kitchen atorvastatin (LIPITOR) 20 MG tablet Take 1 tablet (20 mg total) by mouth daily. 90 tablet 3  . B-D ULTRAFINE III SHORT PEN 31G X 8 MM MISC TEST BLOOD SUGARS 2 TIMES DAILY 100 each 6  . BAYER MICROLET LANCETS lancets USE AS DIRECTED 100 each 5  . docusate sodium (COLACE) 50 MG capsule Take 2 capsules (100 mg total) by mouth daily in the afternoon. 180 capsule 3  . glucose blood (ONE TOUCH ULTRA TEST) test strip Use as instructed three times daily to check blood sugars 100 each 12  . HUMALOG KWIKPEN 100 UNIT/ML KwikPen INJECT 14 UNITS TOTAL INTO THE SKIN 3 TIMES DAILY. 30 mL 2  . hydrochlorothiazide (HYDRODIURIL) 25 MG tablet Take 1 tablet (25 mg total) by mouth daily. 60 tablet 3  . Insulin Glargine (BASAGLAR KWIKPEN) 100 UNIT/ML Inject 0.45 mLs (45 Units total) into the skin daily. 15 pen 1  . Insulin Pen Needle (B-D ULTRAFINE III SHORT PEN) 31G X 8 MM MISC Test blood sugars 2 times daily 100 each 5  . metFORMIN (GLUCOPHAGE-XR) 500 MG 24 hr tablet TAKE 2 TABLETS BY MOUTH 2 TIMES DAILY. 360 tablet 1  . Multiple Vitamin (MULTIVITAMIN) tablet Take  1 tablet by mouth daily.      Marland Kitchen telmisartan (MICARDIS) 40 MG tablet TAKE 1 TABLET (40 MG TOTAL) BY MOUTH DAILY. 90 tablet 0  . UNIFINE PENTIPS 31G X 8 MM MISC TEST BLOOD SUGARS 2 TIMES DAILY 100 each 5  . ezetimibe (ZETIA) 10 MG tablet Take 1 tablet (10 mg total) by mouth daily. 90 tablet 1  . fenofibrate 160 MG tablet Take 1 tablet (160 mg total) by mouth at bedtime. 90 tablet 1   No facility-administered medications prior to visit.    Review of Systems;  Patient denies headache, fevers, malaise, unintentional weight loss, skin rash, eye pain, sinus congestion and sinus  pain, sore throat, dysphagia,  hemoptysis , cough, dyspnea, wheezing, chest pain, palpitations, orthopnea, edema, abdominal pain, nausea, melena, diarrhea, constipation, flank pain, dysuria, hematuria, urinary  Frequency, nocturia, numbness, tingling, seizures,  Focal weakness, Loss of consciousness,  Tremor, insomnia, depression, anxiety, and suicidal ideation.      Objective:  BP 138/80 (BP Location: Left Arm, Patient Position: Sitting, Cuff Size: Normal)   Pulse 75   Temp (!) 97.4 F (36.3 C) (Temporal)   Resp 16   Ht 5\' 6"  (1.676 m)   Wt 166 lb 9.6 oz (75.6 kg)   SpO2 98%   BMI 26.89 kg/m   BP Readings from Last 3 Encounters:  12/27/19 138/80  09/15/19 (!) 144/80  08/31/19 100/60    Wt Readings from Last 3 Encounters:  12/27/19 166 lb 9.6 oz (75.6 kg)  09/15/19 172 lb 6.4 oz (78.2 kg)  08/28/19 170 lb 9.6 oz (77.4 kg)    General appearance: alert, cooperative and appears stated age Ears: normal TM's and external ear canals both ears Throat: lips, mucosa, and tongue normal; teeth and gums normal Neck: no adenopathy, no carotid bruit, supple, symmetrical, trachea midline and thyroid not enlarged, symmetric, no tenderness/mass/nodules Back: symmetric, no curvature. ROM normal. No CVA tenderness. Lungs: clear to auscultation bilaterally Heart: regular rate and rhythm, S1, S2 normal, no murmur, click, rub or gallop Abdomen: soft, non-tender; bowel sounds normal; no masses,  no organomegaly Pulses: 2+ and symmetric Skin: Skin color, texture, turgor normal. No rashes or lesions Lymph nodes: Cervical, supraclavicular, and axillary nodes normal.  Lab Results  Component Value Date   HGBA1C 8.3 (H) 08/31/2019   HGBA1C 8.0 (H) 02/06/2019   HGBA1C 8.0 (H) 10/17/2018    Lab Results  Component Value Date   CREATININE 0.93 08/31/2019   CREATININE 0.66 08/14/2019   CREATININE 0.68 02/06/2019    Lab Results  Component Value Date   WBC 5.1 08/14/2019   HGB 14.7 08/14/2019    HCT 43.6 08/14/2019   PLT 311 08/14/2019   GLUCOSE 199 (H) 08/31/2019   CHOL 226 (H) 08/31/2019   TRIG 131.0 08/31/2019   HDL 42.90 08/31/2019   LDLDIRECT 149.0 02/06/2019   LDLCALC 157 (H) 08/31/2019   ALT 26 08/31/2019   AST 20 08/31/2019   NA 137 08/31/2019   K 4.2 08/31/2019   CL 101 08/31/2019   CREATININE 0.93 08/31/2019   BUN 15 08/31/2019   CO2 30 08/31/2019   TSH 1.16 03/29/2017   PSA 0.67 02/02/2018   HGBA1C 8.3 (H) 08/31/2019   MICROALBUR 1.5 08/31/2019    No results found.  Assessment & Plan:   Problem List Items Addressed This Visit      Unprioritized   Hyperlipidemia    At last visit he had stopped atorvastatin instead of combining with zetia  And fenofibrate.,  and lipids were not  At goal. Repeat levels are needed on combined therapy  Lab Results  Component Value Date   CHOL 226 (H) 08/31/2019   HDL 42.90 08/31/2019   LDLCALC 157 (H) 08/31/2019   LDLDIRECT 149.0 02/06/2019   TRIG 131.0 08/31/2019   CHOLHDL 5 08/31/2019         Relevant Medications   ezetimibe (ZETIA) 10 MG tablet   fenofibrate 160 MG tablet   SOBOE (shortness of breath on exertion)    I am concerned that this may be an anginal equivalent  Given his multiple CRF's.  Cardiology referral for stress testing advised and accepted.       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   humalog 14 units tid qac and metformin.  He will return for fasting labs . He has in the past deferred referral to Vanice Sarah, Pharm D, but after discussing the utility of meeting with her virtually,  He agrees.   Lab Results  Component Value Date   HGBA1C 8.3 (H) 08/31/2019         Relevant Orders   Hemoglobin A1c   Lipid panel   Comprehensive metabolic panel   Microalbumin / creatinine urine ratio   Ambulatory referral to Chronic Care Management  Services     A total of 30 minutes of face to face time was spent with patient more than half of which was spent in counselling about the above mentioned conditions  and coordination of care  I am having Randy Grant maintain his multivitamin, aspirin, glucose blood, Insulin Pen Needle, Bayer Microlet Lancets, B-D ULTRAFINE III SHORT PEN, Accu-Chek Guide, hydrochlorothiazide, docusate sodium, atorvastatin, HumaLOG KwikPen, metFORMIN, telmisartan, Unifine Pentips, Basaglar KwikPen, ezetimibe, and fenofibrate.  Meds ordered this encounter  Medications  . ezetimibe (ZETIA) 10 MG tablet    Sig: Take 1 tablet (10 mg total) by mouth daily.    Dispense:  90 tablet    Refill:  1  . fenofibrate 160 MG tablet    Sig: Take 1 tablet (160 mg total) by mouth at bedtime.    Dispense:  90 tablet    Refill:  1    Medications Discontinued During This Encounter  Medication Reason  . ezetimibe (ZETIA) 10 MG tablet Reorder  . fenofibrate 160 MG tablet Reorder    Follow-up: Return in about 3 months (around 03/28/2020).   Sherlene Shams, MD

## 2019-12-27 NOTE — Assessment & Plan Note (Addendum)
Fasting sugars remain 160 on current regimen of Glargin 45 units daily.   humalog 14 units tid qac and metformin.  He will return for fasting labs . He has in the past deferred referral to Vanice Sarah, Pharm D, but after discussing the utility of meeting with her virtually,  He agrees.   Lab Results  Component Value Date   HGBA1C 8.3 (H) 08/31/2019

## 2019-12-27 NOTE — Patient Instructions (Signed)
I will repeat the referral to Catie Feliz Beam our pharmacist,  To help managed your diabetes  Referral to cardiology for stress testing is in progres s

## 2019-12-28 ENCOUNTER — Encounter: Payer: Self-pay | Admitting: Internal Medicine

## 2019-12-28 ENCOUNTER — Other Ambulatory Visit: Payer: Self-pay | Admitting: Internal Medicine

## 2019-12-28 DIAGNOSIS — R0600 Dyspnea, unspecified: Secondary | ICD-10-CM | POA: Insufficient documentation

## 2019-12-28 DIAGNOSIS — R0609 Other forms of dyspnea: Secondary | ICD-10-CM | POA: Insufficient documentation

## 2019-12-28 MED ORDER — FENOFIBRATE 160 MG PO TABS: 160.0000 mg | ORAL_TABLET | Freq: Every day | ORAL | 1 refills | Status: DC

## 2019-12-28 MED ORDER — EZETIMIBE 10 MG PO TABS: 10.0000 mg | ORAL_TABLET | Freq: Every day | ORAL | 1 refills | Status: DC

## 2019-12-28 NOTE — Assessment & Plan Note (Addendum)
At last visit he had stopped atorvastatin instead of combining with zetia  And fenofibrate., and lipids were not  At goal. Repeat levels are needed on combined therapy  Lab Results  Component Value Date   CHOL 226 (H) 08/31/2019   HDL 42.90 08/31/2019   LDLCALC 157 (H) 08/31/2019   LDLDIRECT 149.0 02/06/2019   TRIG 131.0 08/31/2019   CHOLHDL 5 08/31/2019

## 2019-12-28 NOTE — Assessment & Plan Note (Signed)
I am concerned that this may be an anginal equivalent  Given his multiple CRF's.  Cardiology referral for stress testing advised and accepted.

## 2019-12-29 ENCOUNTER — Telehealth: Payer: Self-pay | Admitting: Internal Medicine

## 2019-12-29 NOTE — Chronic Care Management (AMB) (Signed)
   Care Management   Note  12/29/2019 Name: Randy Grant MRN: [id number] DOB: 1965-10-25  [name] is a 54 y.o. year old male who is a primary care patient of Darrick Huntsman, Mar Daring, MD. Randy Grant is currently enrolled in care management services. An additional referral for Pharm D was placed.   Follow up plan: Telephone appointment with care management team member scheduled for:03/04/2020  Elisha Ponder, LPN Health Advisor, Embedded Care Coordination [facility] Health Care Management ??[email or web address] ??[phone]

## 2020-01-01 ENCOUNTER — Other Ambulatory Visit (INDEPENDENT_AMBULATORY_CARE_PROVIDER_SITE_OTHER): Payer: 59

## 2020-01-01 ENCOUNTER — Other Ambulatory Visit: Payer: Self-pay

## 2020-01-01 DIAGNOSIS — E1165 Type 2 diabetes mellitus with hyperglycemia: Secondary | ICD-10-CM | POA: Diagnosis not present

## 2020-01-01 DIAGNOSIS — E11319 Type 2 diabetes mellitus with unspecified diabetic retinopathy without macular edema: Secondary | ICD-10-CM

## 2020-01-01 DIAGNOSIS — IMO0002 Reserved for concepts with insufficient information to code with codable children: Secondary | ICD-10-CM

## 2020-01-01 LAB — LIPID PANEL
Cholesterol: 230 mg/dL — ABNORMAL HIGH (ref 0–200)
HDL: 38.6 mg/dL — ABNORMAL LOW (ref >39.00)
NonHDL: 191.53
Total CHOL/HDL Ratio: 6
Triglycerides: 375 mg/dL — ABNORMAL HIGH (ref 0.0–149.0)
VLDL: 75 mg/dL — ABNORMAL HIGH (ref 0.0–40.0)

## 2020-01-01 LAB — COMPREHENSIVE METABOLIC PANEL
ALT: 24 U/L (ref 0–53)
AST: 17 U/L (ref 0–37)
Albumin: 4.3 g/dL (ref 3.5–5.2)
Alkaline Phosphatase: 82 U/L (ref 39–117)
BUN: 7 mg/dL (ref 6–23)
CO2: 29 mEq/L (ref 19–32)
Calcium: 9.5 mg/dL (ref 8.4–10.5)
Chloride: 97 mEq/L (ref 96–112)
Creatinine, Ser: 0.76 mg/dL (ref 0.40–1.50)
GFR: 106.17 mL/min (ref >60.00)
Glucose, Bld: 146 mg/dL — ABNORMAL HIGH (ref 70–99)
Potassium: 4.2 mEq/L (ref 3.5–5.1)
Sodium: 135 mEq/L (ref 135–145)
Total Bilirubin: 0.3 mg/dL (ref 0.2–1.2)
Total Protein: 6.7 g/dL (ref 6.0–8.3)

## 2020-01-01 LAB — MICROALBUMIN / CREATININE URINE RATIO
Creatinine,U: 97.7 mg/dL
Microalb Creat Ratio: 1.6 mg/g (ref 0.0–30.0)
Microalb, Ur: 1.6 mg/dL (ref 0.0–1.9)

## 2020-01-01 LAB — HEMOGLOBIN A1C: Hgb A1c MFr Bld: 9.3 % — ABNORMAL HIGH (ref 4.6–6.5)

## 2020-01-01 LAB — LDL CHOLESTEROL, DIRECT: Direct LDL: 130 mg/dL

## 2020-01-02 ENCOUNTER — Telehealth: Payer: Self-pay

## 2020-01-02 NOTE — Telephone Encounter (Signed)
LMTCB for lab results.  

## 2020-01-04 NOTE — Telephone Encounter (Signed)
Pt returned call for lab results.

## 2020-01-09 NOTE — Telephone Encounter (Signed)
Spoke with pt and informed him of his lab results. Pt has both an appt schedule with Dr. Darrick Huntsman and Catie Feliz Beam.

## 2020-01-15 ENCOUNTER — Ambulatory Visit: Payer: 59 | Admitting: Cardiology

## 2020-01-29 ENCOUNTER — Encounter: Payer: Self-pay | Admitting: Internal Medicine

## 2020-01-29 ENCOUNTER — Ambulatory Visit: Payer: 59 | Admitting: Internal Medicine

## 2020-01-29 ENCOUNTER — Other Ambulatory Visit: Payer: Self-pay

## 2020-01-29 VITALS — BP 138/74 | HR 83 | Temp 97.3°F | Resp 16 | Wt 170.0 lb

## 2020-01-29 DIAGNOSIS — Z0000 Encounter for general adult medical examination without abnormal findings: Secondary | ICD-10-CM

## 2020-01-29 DIAGNOSIS — Z1211 Encounter for screening for malignant neoplasm of colon: Secondary | ICD-10-CM

## 2020-01-29 DIAGNOSIS — E782 Mixed hyperlipidemia: Secondary | ICD-10-CM | POA: Diagnosis not present

## 2020-01-29 DIAGNOSIS — Z125 Encounter for screening for malignant neoplasm of prostate: Secondary | ICD-10-CM

## 2020-01-29 DIAGNOSIS — E1165 Type 2 diabetes mellitus with hyperglycemia: Secondary | ICD-10-CM

## 2020-01-29 DIAGNOSIS — G63 Polyneuropathy in diseases classified elsewhere: Secondary | ICD-10-CM | POA: Diagnosis not present

## 2020-01-29 DIAGNOSIS — E349 Endocrine disorder, unspecified: Secondary | ICD-10-CM | POA: Diagnosis not present

## 2020-01-29 DIAGNOSIS — IMO0002 Reserved for concepts with insufficient information to code with codable children: Secondary | ICD-10-CM

## 2020-01-29 DIAGNOSIS — E11319 Type 2 diabetes mellitus with unspecified diabetic retinopathy without macular edema: Secondary | ICD-10-CM

## 2020-01-29 LAB — COMPREHENSIVE METABOLIC PANEL
ALT: 16 U/L (ref 0–53)
AST: 13 U/L (ref 0–37)
Albumin: 4.4 g/dL (ref 3.5–5.2)
Alkaline Phosphatase: 57 U/L (ref 39–117)
BUN: 12 mg/dL (ref 6–23)
CO2: 27 mEq/L (ref 19–32)
Calcium: 9.5 mg/dL (ref 8.4–10.5)
Chloride: 101 mEq/L (ref 96–112)
Creatinine, Ser: 0.8 mg/dL (ref 0.40–1.50)
GFR: 100.04 mL/min (ref >60.00)
Glucose, Bld: 178 mg/dL — ABNORMAL HIGH (ref 70–99)
Potassium: 4.4 mEq/L (ref 3.5–5.1)
Sodium: 135 mEq/L (ref 135–145)
Total Bilirubin: 0.5 mg/dL (ref 0.2–1.2)
Total Protein: 6.7 g/dL (ref 6.0–8.3)

## 2020-01-29 LAB — LIPID PANEL
Cholesterol: 170 mg/dL (ref 0–200)
HDL: 40.8 mg/dL (ref >39.00)
LDL Cholesterol: 103 mg/dL — ABNORMAL HIGH (ref 0–99)
NonHDL: 129.11
Total CHOL/HDL Ratio: 4
Triglycerides: 132 mg/dL (ref 0.0–149.0)
VLDL: 26.4 mg/dL (ref 0.0–40.0)

## 2020-01-29 LAB — PSA: PSA: 0.55 ng/mL (ref 0.10–4.00)

## 2020-01-29 NOTE — Assessment & Plan Note (Signed)
age appropriate education and counseling updated, referrals for colonoscopy  and immunizations addressed, dietary and smoking counseling addressed, most recent labs reviewed.  I have personally reviewed and have noted:  1) the patient's medical and social history 2) The pt's use of alcohol, tobacco, and illicit drugs 3) The patient's current medications and supplements 4) Functional ability including ADL's, fall risk, home safety risk, hearing and visual impairment 5) Diet and physical activities 6) Evidence for depression or mood disorder 7) The patient's height, weight, and BMI have been recorded in the chart  I have made referrals, and provided counseling and education based on review of the above. HE IS DUE FOR TETANUS BOOSTER

## 2020-01-29 NOTE — Assessment & Plan Note (Signed)
He has  Been taking atorvastatin,  zetia and fenofibrate for goal LDL > 100.  Repeat lipids due

## 2020-01-29 NOTE — Assessment & Plan Note (Signed)
Control has been difficult due to his work schedule and cultural dietary lifestyle.  Today we increased his mealtime insulin for his largest meal at 3:30 pm to 18 units and advised him to continue 16 units at his other 2 meals and 45 units Basaglar

## 2020-01-29 NOTE — Progress Notes (Signed)
Patient ID: Randy Grant, male    DOB: 02-04-1966  Age: 54 y.o. MRN: [id number]  The patient is here for annual  wellness examination and management of other chronic and acute problems.   The risk factors are reflected in the social history.  The roster of all physicians providing medical care to patient - is listed in the Snapshot section of the chart.  Activities of daily living:  The patient is 100% independent in all ADLs: dressing, toileting, feeding as well as independent mobility  Home safety : The patient has smoke detectors in the home. They wear seatbelts.  There are no firearms at home. There is no violence in the home.   There is no risks for hepatitis, STDs or HIV. There is no   history of blood transfusion. They have no travel history to infectious disease endemic areas of the world.  The patient has seen their dentist in the last six month. They have seen their eye doctor in the last year.  HE DENIES hearing difficulty with regard to whispered voices and some television programs.    They do not  have excessive sun exposure. Discussed the need for sun protection: hats, long sleeves and use of sunscreen if there is significant sun exposure.   Diet: the importance of a healthy diet is discussed. They do have a healthy diet.  The benefits of regular aerobic exercise were discussed.  He walks 4 times per week ,  20 minutes.   Depression screen: there are no signs or vegative symptoms of depression- irritability, change in appetite, anhedonia, sadness/tearfullness.   The following portions of the patient's history were reviewed and updated as appropriate: allergies, current medications, past family history, past medical history,  past surgical history, past social history  and problem list.  Visual acuity was not assessed per patient preference since she has regular follow up with her ophthalmologist. Hearing and body mass index were assessed and reviewed.    recommended  immunizations.    CC: The primary encounter diagnosis was Mixed hyperlipidemia. Diagnoses of Prostate cancer ([place]) and Prostate cancer screening were also pertinent to this visit.  1) DM  One month follow up: Did not bring log of BS.  Patient works 3rd shift and observes Ramadan.  BS upon waking at 2 pm  have been  100 to 120.  (sleeps from 4:30 am to 2 pm ) . Eats a light dinner after work at 4:30 am,  Takes 45 units of basaglar then along with 16 units of humalog, which he takes three times daily with meals.  2 hr post prandials are reportedly  Ranging from 195 to 230. Lunch is at 3:30 pm  He notes that after physical exertion his BS drops to 60 to 70. This occurs around 9-10 pm . Does not occur if he has a snack at 6 pm.   Using 16 units three times daily .     History Randy Grant has a past medical history of Cataract (2017), Diabetes mellitus, GERD (gastroesophageal reflux disease), Hyperlipidemia, and Hypertension.   He has no past surgical history on file.   His family history includes Diabetes in his father, mother, sister, and sister; Hyperlipidemia in his father.He reports that he quit smoking about 18 years ago. He has never used smokeless tobacco. He reports that he does not drink alcohol and does not use drugs.  Outpatient Medications Prior to Visit  Medication Sig Dispense Refill  . ACCU-CHEK GUIDE test strip USE AS INSTRUCTED  100 strip 5  . aspirin 81 MG tablet Take 81 mg by mouth daily.      Marland Kitchen atorvastatin (LIPITOR) 20 MG tablet Take 1 tablet (20 mg total) by mouth daily. 90 tablet 3  . B-D ULTRAFINE III SHORT PEN 31G X 8 MM MISC TEST BLOOD SUGARS 2 TIMES DAILY 100 each 6  . BAYER MICROLET LANCETS lancets USE AS DIRECTED 100 each 5  . docusate sodium (COLACE) 50 MG capsule Take 2 capsules (100 mg total) by mouth daily in the afternoon. 180 capsule 3  . ezetimibe (ZETIA) 10 MG tablet Take 1 tablet (10 mg total) by mouth daily. 90 tablet 1  . fenofibrate 160 MG tablet Take 1 tablet  (160 mg total) by mouth at bedtime. 90 tablet 1  . glucose blood (ONE TOUCH ULTRA TEST) test strip Use as instructed three times daily to check blood sugars 100 each 12  . HUMALOG KWIKPEN 100 UNIT/ML KwikPen INJECT 14 UNITS TOTAL INTO THE SKIN 3 TIMES DAILY. 30 mL 2  . hydrochlorothiazide (HYDRODIURIL) 25 MG tablet Take 1 tablet (25 mg total) by mouth daily. 60 tablet 3  . Insulin Glargine (BASAGLAR KWIKPEN) 100 UNIT/ML Inject 0.45 mLs (45 Units total) into the skin daily. 15 pen 1  . Insulin Pen Needle (B-D ULTRAFINE III SHORT PEN) 31G X 8 MM MISC Test blood sugars 2 times daily 100 each 5  . metFORMIN (GLUCOPHAGE-XR) 500 MG 24 hr tablet TAKE 2 TABLETS BY MOUTH 2 TIMES DAILY. 360 tablet 1  . Multiple Vitamin (MULTIVITAMIN) tablet Take 1 tablet by mouth daily.      Marland Kitchen telmisartan (MICARDIS) 40 MG tablet TAKE 1 TABLET (40 MG TOTAL) BY MOUTH DAILY. 90 tablet 0  . UNIFINE PENTIPS 31G X 8 MM MISC TEST BLOOD SUGARS 2 TIMES DAILY 100 each 5   No facility-administered medications prior to visit.    Review of Systems   Patient denies headache, fevers, malaise, unintentional weight loss, skin rash, eye pain, sinus congestion and sinus pain, sore throat, dysphagia,  hemoptysis , cough, dyspnea, wheezing, chest pain, palpitations, orthopnea, edema, abdominal pain, nausea, melena, diarrhea, constipation, flank pain, dysuria, hematuria, urinary  Frequency, nocturia, numbness, tingling, seizures,  Focal weakness, Loss of consciousness,  Tremor, insomnia, depression, anxiety, and suicidal ideation.      Objective:  BP 138/74   Pulse 83   Temp (!) 97.3 F (36.3 C)   Resp 16   Wt 170 lb (77.1 kg)   SpO2 99%   BMI 27.44 kg/m   Physical Exam  General appearance: alert, cooperative and appears stated age Ears: normal TM's and external ear canals both ears Throat: lips, mucosa, and tongue normal; teeth and gums normal Neck: no adenopathy, no carotid bruit, supple, symmetrical, trachea midline and  thyroid not enlarged, symmetric, no tenderness/mass/nodules Back: symmetric, no curvature. ROM normal. No CVA tenderness. Lungs: clear to auscultation bilaterally Heart: regular rate and rhythm, S1, S2 normal, no murmur, click, rub or gallop Abdomen: soft, non-tender; bowel sounds normal; no masses,  no organomegaly Pulses: 2+ and symmetric Skin: Skin color, texture, turgor normal. No rashes or lesions Lymph nodes: Cervical, supraclavicular, and axillary nodes normal.   Assessment & Plan:   Problem List Items Addressed This Visit      Unprioritized   Hyperlipidemia - Primary   Relevant Orders   Lipid panel   Comprehensive metabolic panel   Prostate cancer screening   Relevant Orders   PSA    Other Visit Diagnoses  Prostate cancer (HCC)          I am having Radek E. Finkler maintain his multivitamin, aspirin, glucose blood, Insulin Pen Needle, Bayer Microlet Lancets, B-D ULTRAFINE III SHORT PEN, Accu-Chek Guide, hydrochlorothiazide, docusate sodium, atorvastatin, HumaLOG KwikPen, metFORMIN, telmisartan, Unifine Pentips, Basaglar KwikPen, ezetimibe, and fenofibrate.  No orders of the defined types were placed in this encounter.   There are no discontinued medications.  Follow-up: No follow-ups on file.   Sherlene Shams, MD

## 2020-01-29 NOTE — Patient Instructions (Addendum)
Increase the mealtime insulin at lunchtime (3:30 pm) to 18 units  Continue the lantus and other mealtime insulins at their current dose   You are on 3 medications to lower your cholesterol.  Continue all 3    Health Maintenance, Male Adopting a healthy lifestyle and getting preventive care are important in promoting health and wellness. Ask your health care provider about:  The right schedule for you to have regular tests and exams.  Things you can do on your own to prevent diseases and keep yourself healthy. What should I know about diet, weight, and exercise? Eat a healthy diet   Eat a diet that includes plenty of vegetables, fruits, low-fat dairy products, and lean protein.  Do not eat a lot of foods that are high in solid fats, added sugars, or sodium. Maintain a healthy weight Body mass index (BMI) is a measurement that can be used to identify possible weight problems. It estimates body fat based on height and weight. Your health care provider can help determine your BMI and help you achieve or maintain a healthy weight. Get regular exercise Get regular exercise. This is one of the most important things you can do for your health. Most adults should:  Exercise for at least 150 minutes each week. The exercise should increase your heart rate and make you sweat (moderate-intensity exercise).  Do strengthening exercises at least twice a week. This is in addition to the moderate-intensity exercise.  Spend less time sitting. Even light physical activity can be beneficial. Watch cholesterol and blood lipids Have your blood tested for lipids and cholesterol at 56 years of age, then have this test every 5 years. You may need to have your cholesterol levels checked more often if:  Your lipid or cholesterol levels are high.  You are older than 56 years of age.  You are at high risk for heart disease. What should I know about cancer screening? Many types of cancers can be detected  early and may often be prevented. Depending on your health history and family history, you may need to have cancer screening at various ages. This may include screening for:  Colorectal cancer.  Prostate cancer.  Skin cancer.  Lung cancer. What should I know about heart disease, diabetes, and high blood pressure? Blood pressure and heart disease  High blood pressure causes heart disease and increases the risk of stroke. This is more likely to develop in people who have high blood pressure readings, are of African descent, or are overweight.  Talk with your health care provider about your target blood pressure readings.  Have your blood pressure checked: ? Every 3-5 years if you are 56-56 years of age. ? Every year if you are 56 years old or older.  If you are between the ages of 56 and 18 and are a current or former smoker, ask your health care provider if you should have a one-time screening for abdominal aortic aneurysm (AAA). Diabetes Have regular diabetes screenings. This checks your fasting blood sugar level. Have the screening done:  Once every three years after age 56 if you are at a normal weight and have a low risk for diabetes.  More often and at a younger age if you are overweight or have a high risk for diabetes. What should I know about preventing infection? Hepatitis B If you have a higher risk for hepatitis B, you should be screened for this virus. Talk with your health care provider to find out if you  are at risk for hepatitis B infection. Hepatitis C Blood testing is recommended for:  Everyone born from [birth year] through 1967.  Anyone with known risk factors for hepatitis C. Sexually transmitted infections (STIs)  You should be screened each year for STIs, including gonorrhea and chlamydia, if: ? You are sexually active and are younger than 56 years of age. ? You are older than 56 years of age and your health care provider tells you that you are at risk for this  type of infection. ? Your sexual activity has changed since you were last screened, and you are at increased risk for chlamydia or gonorrhea. Ask your health care provider if you are at risk.  Ask your health care provider about whether you are at high risk for HIV. Your health care provider may recommend a prescription medicine to help prevent HIV infection. If you choose to take medicine to prevent HIV, you should first get tested for HIV. You should then be tested every 3 months for as long as you are taking the medicine. Follow these instructions at home: Lifestyle  Do not use any products that contain nicotine or tobacco, such as cigarettes, e-cigarettes, and chewing tobacco. If you need help quitting, ask your health care provider.  Do not use street drugs.  Do not share needles.  Ask your health care provider for help if you need support or information about quitting drugs. Alcohol use  Do not drink alcohol if your health care provider tells you not to drink.  If you drink alcohol: ? Limit how much you have to 0-2 drinks a day. ? Be aware of how much alcohol is in your drink. In the [place] one drink equals one 12 oz bottle of beer (355 mL), one 5 oz glass of wine (148 mL), or one 1 oz glass of hard liquor (44 mL). General instructions  Schedule regular health, dental, and eye exams.  Stay current with your vaccines.  Tell your health care provider if: ? You often feel depressed. ? You have ever been abused or do not feel safe at home. Summary  Adopting a healthy lifestyle and getting preventive care are important in promoting health and wellness.  Follow your health care provider's instructions about healthy diet, exercising, and getting tested or screened for diseases.  Follow your health care provider's instructions on monitoring your cholesterol and blood pressure. This information is not intended to replace advice given to you by your health care provider. Make sure  you discuss any questions you have with your health care provider. Document Revised: 07/20/2018 Document Reviewed: 07/20/2018 Elsevier Patient Education  2020 [employer].

## 2020-01-30 NOTE — Progress Notes (Signed)
His cholesterol is finally acceptable after taking ALL 3 MEDS FOR CHOLESTEROL.  Continue all 3  and follow up in 3 months

## 2020-02-19 ENCOUNTER — Ambulatory Visit: Payer: 59 | Admitting: Cardiology

## 2020-02-20 NOTE — Progress Notes (Deleted)
New Outpatient Visit Date: 02/21/2020  Referring Provider: Sherlene Shams, MD [address] [place],  [place] [address]  Chief Complaint: ***  HPI:  [name] is a 54 y.o. male who is being seen today for the evaluation of exertional tachycardia and shortness of breath at the request of Dr. Darrick Huntsman. He has a history of hypertension, hyperlipidemia, type 2 diabetes mellitus, and GERD. ***  --------------------------------------------------------------------------------------------------  Cardiovascular History & Procedures: Cardiovascular Problems:  Tachycardia  Shortness of breath  Risk Factors:  Hypertension, hyperlipidemia, diabetes mellitus, male gender, age greater than 80, and prior tobacco use  Cath/PCI:  None  CV Surgery:  None  EP Procedures and Devices:  None  Non-Invasive Evaluation(s):  None  Recent CV Pertinent Labs: Lab Results  Component Value Date   CHOL 170 01/29/2020   HDL 40.80 01/29/2020   LDLCALC 103 (H) 01/29/2020   LDLDIRECT 130.0 01/01/2020   TRIG 132.0 01/29/2020   CHOLHDL 4 01/29/2020   K 4.4 01/29/2020   K 4.0 08/12/2011   BUN 12 01/29/2020   BUN 12 08/12/2011   CREATININE 0.80 01/29/2020   CREATININE 0.70 08/12/2011    --------------------------------------------------------------------------------------------------  Past Medical History:  Diagnosis Date   Cataract 2017   left   Diabetes mellitus    GERD (gastroesophageal reflux disease)    Hyperlipidemia    Hypertension     No past surgical history on file.  No outpatient medications have been marked as taking for the 02/21/20 encounter (Appointment) with Jilene Spohr, Cristal Deer, MD.    Allergies: Patient has no known allergies.  Social History   Tobacco Use   Smoking status: Former Smoker    Quit date: 06/25/2001    Years since quitting: 18.6   Smokeless tobacco: Never Used  Substance Use Topics   Alcohol use: No    Alcohol/week: 0.0  standard drinks   Drug use: No    Family History  Problem Relation Age of Onset   Diabetes Mother    Hyperlipidemia Father    Diabetes Father    Diabetes Sister    Diabetes Sister     Review of Systems: A 12-system review of systems was performed and was negative except as noted in the HPI.  --------------------------------------------------------------------------------------------------  Physical Exam: There were no vitals taken for this visit.  General:  *** HEENT: No conjunctival pallor or scleral icterus. Facemask in place. Neck: Supple without lymphadenopathy, thyromegaly, JVD, or HJR. No carotid bruit. Lungs: Normal work of breathing. Clear to auscultation bilaterally without wheezes or crackles. Heart: Regular rate and rhythm without murmurs, rubs, or gallops. Non-displaced PMI. Abd: Bowel sounds present. Soft, NT/ND without hepatosplenomegaly Ext: No lower extremity edema. Radial, PT, and DP pulses are 2+ bilaterally Skin: Warm and dry without rash. Neuro: CNIII-XII intact. Strength and fine-touch sensation intact in upper and lower extremities bilaterally. Psych: Normal mood and affect.  EKG:  ***  Lab Results  Component Value Date   WBC 5.1 08/14/2019   HGB 14.7 08/14/2019   HCT 43.6 08/14/2019   MCV 78.8 (L) 08/14/2019   PLT 311 08/14/2019    Lab Results  Component Value Date   NA 135 01/29/2020   K 4.4 01/29/2020   CL 101 01/29/2020   CO2 27 01/29/2020   BUN 12 01/29/2020   CREATININE 0.80 01/29/2020   GLUCOSE 178 (H) 01/29/2020   ALT 16 01/29/2020    Lab Results  Component Value Date   CHOL 170 01/29/2020   HDL 40.80 01/29/2020  LDLCALC 103 (H) 01/29/2020   LDLDIRECT 130.0 01/01/2020   TRIG 132.0 01/29/2020   CHOLHDL 4 01/29/2020     --------------------------------------------------------------------------------------------------  ASSESSMENT AND PLAN: Yvonne Kendall, MD 02/20/2020 7:36 PM

## 2020-02-21 ENCOUNTER — Ambulatory Visit: Payer: 59 | Admitting: Internal Medicine

## 2020-02-21 NOTE — Progress Notes (Signed)
New Outpatient Visit Date: 02/23/2020  Referring Provider: Sherlene Shams, MD [address] [place],  [place] [address]  Chief Complaint: Elevated heart rates  HPI:  Randy Grant is a 54 y.o. male who is being seen today for the evaluation of exertional tachycardia and shortness of breath at the request of Dr. Darrick Huntsman. He has a history of hypertension, hyperlipidemia, type 2 diabetes mellitus, and GERD. Randy Grant states that his heart seems to beat faster and pump harder with activity than he is accustomed to.  This began about 2 weeks ago and actually seems to be getting a little better.  He denies chest pain, shortness of breath, lightheadedness, and edema.  Exertional dyspnea was previously described by Randy Grant PCP, though he denies this today.  He does not have a history of prior cardiac disease or testing.  He is asymptomatic at rest.  He has longstanding heartburn but states that his current symptoms are different than what he has felt in the past with heartburn.  Randy Grant is concerned that his elevated heart rates with exercise may have been due to sleep deprivation, as he was only sleeping 4-5 hours/day when his symptoms began.  --------------------------------------------------------------------------------------------------  Cardiovascular History & Procedures: Cardiovascular Problems:  Tachycardia  Dyspnea on exertion  Risk Factors:  Hypertension, hyperlipidemia, diabetes mellitus, male gender, age greater than 92, and prior tobacco use  Cath/PCI:  None  CV Surgery:  None  EP Procedures and Devices:  None  Non-Invasive Evaluation(s):  None  Recent CV Pertinent Labs: Lab Results  Component Value Date   CHOL 170 01/29/2020   HDL 40.80 01/29/2020   LDLCALC 103 (H) 01/29/2020   LDLDIRECT 130.0 01/01/2020   TRIG 132.0 01/29/2020   CHOLHDL 4 01/29/2020   K 4.4 01/29/2020   K 4.0 08/12/2011   BUN 12 01/29/2020   BUN 12  08/12/2011   CREATININE 0.80 01/29/2020   CREATININE 0.70 08/12/2011    --------------------------------------------------------------------------------------------------  Past Medical History:  Diagnosis Date  . Cataract 2017   left  . Diabetes mellitus   . GERD (gastroesophageal reflux disease)   . Hyperlipidemia   . Hypertension     History reviewed. No pertinent surgical history.  Current Meds  Medication Sig  . ACCU-CHEK GUIDE test strip USE AS INSTRUCTED  . aspirin 81 MG tablet Take 81 mg by mouth daily.    Marland Kitchen atorvastatin (LIPITOR) 20 MG tablet Take 1 tablet (20 mg total) by mouth daily.  . B-D ULTRAFINE III SHORT PEN 31G X 8 MM MISC TEST BLOOD SUGARS 2 TIMES DAILY  . BAYER MICROLET LANCETS lancets USE AS DIRECTED  . ezetimibe (ZETIA) 10 MG tablet Take 1 tablet (10 mg total) by mouth daily.  . fenofibrate 160 MG tablet Take 1 tablet (160 mg total) by mouth at bedtime.  Marland Kitchen glucose blood (ONE TOUCH ULTRA TEST) test strip Use as instructed three times daily to check blood sugars  . HUMALOG KWIKPEN 100 UNIT/ML KwikPen INJECT 14 UNITS TOTAL INTO THE SKIN 3 TIMES DAILY.  . hydrochlorothiazide (HYDRODIURIL) 25 MG tablet Take 1 tablet (25 mg total) by mouth daily.  . Insulin Glargine (BASAGLAR KWIKPEN) 100 UNIT/ML Inject 0.45 mLs (45 Units total) into the skin daily.  . Insulin Pen Needle (B-D ULTRAFINE III SHORT PEN) 31G X 8 MM MISC Test blood sugars 2 times daily  . metFORMIN (GLUCOPHAGE-XR) 500 MG 24 hr tablet TAKE 2 TABLETS BY MOUTH 2 TIMES DAILY.  . Multiple Vitamin (MULTIVITAMIN) tablet Take  1 tablet by mouth daily.    Marland Kitchen telmisartan (MICARDIS) 40 MG tablet TAKE 1 TABLET (40 MG TOTAL) BY MOUTH DAILY.  Marland Kitchen UNIFINE PENTIPS 31G X 8 MM MISC TEST BLOOD SUGARS 2 TIMES DAILY    Allergies: Patient has no known allergies.  Social History   Tobacco Use  . Smoking status: Former Smoker    Quit date: 06/25/2001    Years since quitting: 18.6  . Smokeless tobacco: Never Used  Vaping  Use  . Vaping Use: Never used  Substance Use Topics  . Alcohol use: No    Alcohol/week: 0.0 standard drinks  . Drug use: No    Family History  Problem Relation Age of Onset  . Diabetes Mother   . Hyperlipidemia Father   . Diabetes Father   . Diabetes Sister   . Diabetes Sister     Review of Systems: A 12-system review of systems was performed and was negative except as noted in the HPI.  --------------------------------------------------------------------------------------------------  Physical Exam: BP (!) 160/80 (BP Location: Right Arm, Patient Position: Sitting, Cuff Size: Normal)   Pulse 94   Ht 5\' 6"  (1.676 m)   Wt 163 lb 2 oz (74 kg)   SpO2 99%   BMI 26.33 kg/m   General:  NAD HEENT: No conjunctival pallor or scleral icterus. Facemask in place. Neck: Supple without lymphadenopathy, thyromegaly, JVD, or HJR. No carotid bruit. Lungs: Normal work of breathing. Clear to auscultation bilaterally without wheezes or crackles. Heart: Regular rate and rhythm without murmurs, rubs, or gallops. Non-displaced PMI. Abd: Bowel sounds present. Soft, NT/ND without hepatosplenomegaly Ext: No lower extremity edema. Radial, PT, and DP pulses are 2+ bilaterally Skin: Warm and dry without rash. Neuro: CNIII-XII intact. Strength and fine-touch sensation intact in upper and lower extremities bilaterally. Psych: Normal mood and affect.  EKG:  NSR with bifascicular block (LAFB and RBBB).  Lab Results  Component Value Date   WBC 5.1 08/14/2019   HGB 14.7 08/14/2019   HCT 43.6 08/14/2019   MCV 78.8 (L) 08/14/2019   PLT 311 08/14/2019    Lab Results  Component Value Date   NA 135 01/29/2020   K 4.4 01/29/2020   CL 101 01/29/2020   CO2 27 01/29/2020   BUN 12 01/29/2020   CREATININE 0.80 01/29/2020   GLUCOSE 178 (H) 01/29/2020   ALT 16 01/29/2020    Lab Results  Component Value Date   CHOL 170 01/29/2020   HDL 40.80 01/29/2020   LDLCALC 103 (H) 01/29/2020   LDLDIRECT  130.0 01/01/2020   TRIG 132.0 01/29/2020   CHOLHDL 4 01/29/2020     --------------------------------------------------------------------------------------------------  ASSESSMENT AND PLAN: Tachycardia, abnormal EKG, and dyspnea on exertion: Randy Grant notes only elevated heart rates when exercising, though prior notes mention exertional dyspnea.  He is asymptomatic at rest.  Examination today is unrevealing.  EKG shows upper normal resting heart rate with bifascicular block of uncertain chronicity.  Given his numerous cardiac risk factors (hypertension, hyperlipidemia, longstanding diabetes mellitus, male gender, and prior tobacco use), we have to entertain ischemic heart disease despite atypical symptoms.  I have recommended that we obtain a transthoracic echocardiogram and exercise myocardial perfusion stress test.  We will also check a CBC and TSH when Randy Grant presents for his stress test.  Randy Grant should continue aspirin 81 mg daily.  We will defer other medication changes at this time.  Hyperlipidemia: LDL mildly elevated at 103 (goal < 100 given history of diabetes mellituis).  For now,  we will continue atorvastatin 20 mg daily.  Further escalation will need to be considered, particularly if there is evidence on ASCVD on planned testing.  Hypertension: Blood pressure poorly controlled, though Randy Grant reports much better readings at home.  We will defer medication changes today, though if BP at followup remains above 130/80, escalation of antihypertensive regimen will need to be considered.  Uncontrolled diabetes mellitus: Last A1c in 12/2019 was 9.3%.  Continued follow-up with Dr. Darrick Huntsman.  Follow-up: Return to clinic in 6 weeks.  Yvonne Kendall, MD 02/24/2020 1:49 PM

## 2020-02-23 ENCOUNTER — Ambulatory Visit (INDEPENDENT_AMBULATORY_CARE_PROVIDER_SITE_OTHER): Payer: 59 | Admitting: Internal Medicine

## 2020-02-23 ENCOUNTER — Encounter: Payer: Self-pay | Admitting: Internal Medicine

## 2020-02-23 ENCOUNTER — Other Ambulatory Visit: Payer: Self-pay

## 2020-02-23 VITALS — BP 160/80 | HR 94 | Ht 66.0 in | Wt 163.1 lb

## 2020-02-23 DIAGNOSIS — E782 Mixed hyperlipidemia: Secondary | ICD-10-CM | POA: Diagnosis not present

## 2020-02-23 DIAGNOSIS — R000 Tachycardia, unspecified: Secondary | ICD-10-CM

## 2020-02-23 DIAGNOSIS — R0600 Dyspnea, unspecified: Secondary | ICD-10-CM | POA: Diagnosis not present

## 2020-02-23 DIAGNOSIS — R9431 Abnormal electrocardiogram [ECG] [EKG]: Secondary | ICD-10-CM | POA: Diagnosis not present

## 2020-02-23 DIAGNOSIS — E1165 Type 2 diabetes mellitus with hyperglycemia: Secondary | ICD-10-CM

## 2020-02-23 DIAGNOSIS — R0609 Other forms of dyspnea: Secondary | ICD-10-CM

## 2020-02-23 DIAGNOSIS — I10 Essential (primary) hypertension: Secondary | ICD-10-CM | POA: Diagnosis not present

## 2020-02-23 NOTE — Patient Instructions (Signed)
Medication Instructions:  Your physician recommends that you continue on your current medications as directed. Please refer to the Current Medication list given to you today.  *If you need a refill on your cardiac medications before your next appointment, please call your pharmacy*   Lab Work: none If you have labs (blood work) drawn today and your tests are completely normal, you will receive your results only by:  MyChart Message (if you have MyChart) OR  A paper copy in the mail If you have any lab test that is abnormal or we need to change your treatment, we will call you to review the results.   Testing/Procedures: 1- ECHOCARDIOGRAM- Your physician has requested that you have an echocardiogram. Echocardiography is a painless test that uses sound waves to create images of your heart. It provides your doctor with information about the size and shape of your heart and how well your hearts chambers and valves are working. This procedure takes approximately one hour. There are no restrictions for this procedure. You may get an IV, if needed, to receive an ultrasound enhancing agent through to better visualize your heart.    2- EXERCISE STRESS MYOVIEW STRESS TEST Your physician has requested that you have en exercise stress myoview.  [facility] MYOVIEW  Your caregiver has ordered a Stress Test with nuclear imaging. The purpose of this test is to evaluate the blood supply to your heart muscle. This procedure is referred to as a "Non-Invasive Stress Test." This is because other than having an IV started in your vein, nothing is inserted or "invades" your body. Cardiac stress tests are done to find areas of poor blood flow to the heart by determining the extent of coronary artery disease (CAD). Some patients exercise on a treadmill, which naturally increases the blood flow to your heart, while others who are  unable to walk on a treadmill due to physical limitations have a pharmacologic/chemical  stress agent called Lexiscan . This medicine will mimic walking on a treadmill by temporarily increasing your coronary blood flow.   Please note: these test may take anywhere between 2-4 hours to complete  PLEASE REPORT TO [facility] ENTRANCE  THE VOLUNTEERS AT THE FIRST DESK WILL DIRECT YOU WHERE TO GO  Date of Procedure:_____________________________________  Arrival Time for Procedure:______________________________  Instructions regarding medication:   _xx_ : Hold diabetes medication morning of procedure - Metformin  Take 1/2 dose of night insulin the night before.   No insulin the morning of.   PLEASE NOTIFY THE OFFICE AT LEAST 24 HOURS IN ADVANCE IF YOU ARE UNABLE TO KEEP YOUR APPOINTMENT.  [phone] AND  PLEASE NOTIFY NUCLEAR MEDICINE AT [facility] AT LEAST 24 HOURS IN ADVANCE IF YOU ARE UNABLE TO KEEP YOUR APPOINTMENT. [phone]  How to prepare for your Myoview test:  1. Do not eat or drink after midnight 2. No caffeine for 24 hours prior to test 3. No smoking 24 hours prior to test. 4. Your medication may be taken with water.  If your doctor stopped a medication because of this test, do not take that medication. 5. Please wear a short sleeve shirt. 6. No perfume, cologne or lotion. 7. Wear comfortable walking shoes.   Follow-Up: At [facility], you and your health needs are our priority.  As part of our continuing mission to provide you with exceptional heart care, we have created designated Provider Care Teams.  These Care Teams include your primary Cardiologist (physician) and Advanced Practice Providers (APPs -  Physician  Assistants and Nurse Practitioners) who all work together to provide you with the care you need, when you need it.  We recommend signing up for the patient portal called "MyChart".  Sign up information is provided on this After Visit Summary.  MyChart is used to connect with patients for Virtual Visits (Telemedicine).  Patients are able to view  lab/test results, encounter notes, upcoming appointments, etc.  Non-urgent messages can be sent to your provider as well.   To learn more about what you can do with MyChart, go to [email or web address].    Your next appointment:   6 week(s)  The format for your next appointment:   In Person  Provider:    You may see DR Cristal Deer END or one of the following Advanced Practice Providers on your designated Care Team:    Nicolasa Ducking, NP  Eula Listen, PA-C  Marisue Ivan, PA-C    Cardiac Nuclear Scan A cardiac nuclear scan is a test that measures blood flow to the heart when a person is resting and when he or she is exercising. The test looks for problems such as:  Not enough blood reaching a portion of the heart.  The heart muscle not working normally. You may need this test if:  You have heart disease.  You have had abnormal lab results.  You have had heart surgery or a balloon procedure to open up blocked arteries (angioplasty).  You have chest pain.  You have shortness of breath. In this test, a radioactive dye (tracer) is injected into your bloodstream. After the tracer has traveled to your heart, an imaging device is used to measure how much of the tracer is absorbed by or distributed to various areas of your heart. This procedure is usually done at a hospital and takes 2-4 hours. Tell a health care provider about:  Any allergies you have.  All medicines you are taking, including vitamins, herbs, eye drops, creams, and over-the-counter medicines.  Any problems you or family members have had with anesthetic medicines.  Any blood disorders you have.  Any surgeries you have had.  Any medical conditions you have.  Whether you are pregnant or may be pregnant. What are the risks? Generally, this is a safe procedure. However, problems may occur, including:  Serious chest pain and heart attack. This is only a risk if the stress portion of the test is  done.  Rapid heartbeat.  Sensation of warmth in your chest. This usually passes quickly.  Allergic reaction to the tracer. What happens before the procedure?  Ask your health care provider about changing or stopping your regular medicines. This is especially important if you are taking diabetes medicines or blood thinners.  Follow instructions from your health care provider about eating or drinking restrictions.  Remove your jewelry on the day of the procedure. What happens during the procedure?  An IV will be inserted into one of your veins.  Your health care provider will inject a small amount of radioactive tracer through the IV.  You will wait for 20-40 minutes while the tracer travels through your bloodstream.  Your heart activity will be monitored with an electrocardiogram (ECG).  You will lie down on an exam table.  Images of your heart will be taken for about 15-20 minutes.  You may also have a stress test. For this test, one of the following may be done: ? You will exercise on a treadmill or stationary bike. While you exercise, your heart's activity will be monitored  with an ECG, and your blood pressure will be checked. ? You will be given medicines that will increase blood flow to parts of your heart. This is done if you are unable to exercise.  When blood flow to your heart has peaked, a tracer will again be injected through the IV.  After 20-40 minutes, you will get back on the exam table and have more images taken of your heart.  Depending on the type of tracer used, scans may need to be repeated 3-4 hours later.  Your IV line will be removed when the procedure is over. The procedure may vary among health care providers and hospitals. What happens after the procedure?  Unless your health care provider tells you otherwise, you may return to your normal schedule, including diet, activities, and medicines.  Unless your health care provider tells you otherwise,  you may increase your fluid intake. This will help to flush the contrast dye from your body. Drink enough fluid to keep your urine pale yellow.  Ask your health care provider, or the department that is doing the test: ? When will my results be ready? ? How will I get my results? Summary  A cardiac nuclear scan measures the blood flow to the heart when a person is resting and when he or she is exercising.  Tell your health care provider if you are pregnant.  Before the procedure, ask your health care provider about changing or stopping your regular medicines. This is especially important if you are taking diabetes medicines or blood thinners.  After the procedure, unless your health care provider tells you otherwise, increase your fluid intake. This will help flush the contrast dye from your body.  After the procedure, unless your health care provider tells you otherwise, you may return to your normal schedule, including diet, activities, and medicines. This information is not intended to replace advice given to you by your health care provider. Make sure you discuss any questions you have with your health care provider. Document Revised: 01/10/2018 Document Reviewed: 01/10/2018 Elsevier Patient Education  2020 [employer].

## 2020-02-24 ENCOUNTER — Encounter: Payer: Self-pay | Admitting: Internal Medicine

## 2020-02-24 DIAGNOSIS — R9431 Abnormal electrocardiogram [ECG] [EKG]: Secondary | ICD-10-CM | POA: Insufficient documentation

## 2020-02-24 DIAGNOSIS — R000 Tachycardia, unspecified: Secondary | ICD-10-CM | POA: Insufficient documentation

## 2020-02-24 DIAGNOSIS — E1165 Type 2 diabetes mellitus with hyperglycemia: Secondary | ICD-10-CM | POA: Insufficient documentation

## 2020-02-26 ENCOUNTER — Telehealth: Payer: Self-pay

## 2020-02-26 DIAGNOSIS — I10 Essential (primary) hypertension: Secondary | ICD-10-CM

## 2020-02-26 DIAGNOSIS — R000 Tachycardia, unspecified: Secondary | ICD-10-CM

## 2020-02-26 NOTE — Telephone Encounter (Signed)
-----   Message from Yvonne Kendall, MD sent at 02/24/2020  1:59 PM EDT ----- Hello,I forgot to ask Victorino Dike to arrange for Mr. Gully to have a CBC and TSH checked when he comes in for his stress test.  Would you be kind enough to help arrange this?  Let me know if any questions or concerns come up.  Thanks.Thayer Ohm

## 2020-02-26 NOTE — Telephone Encounter (Signed)
Lab orders have been placed for the Cbc and Tsh to be drawn at the medical mall on 03/11/20 the same day as his scheduled stress test. Patient made aware and verbalized understanding.

## 2020-03-04 ENCOUNTER — Telehealth: Payer: 59

## 2020-03-04 ENCOUNTER — Telehealth: Payer: Self-pay | Admitting: Pharmacist

## 2020-03-04 NOTE — Telephone Encounter (Signed)
  Chronic Care Management   Note  03/04/2020 Name: HYUN MARSALIS MRN: [id number] DOB: 1966-05-21  Attempted to contact patient for medication management review. Left HIPAA compliant message for patient to return my call at their convenience.   Plan: - Will collaborate with Care Guide to outreach to schedule follow up with me  Catie Feliz Beam, PharmD, [place], CPP Clinical Pharmacist [facility] [employer] [phone]

## 2020-03-05 ENCOUNTER — Telehealth: Payer: Self-pay

## 2020-03-05 NOTE — Chronic Care Management (AMB) (Signed)
  Care Management   Note  03/05/2020 Name: LEM PEARY MRN: [id number] DOB: [birth year]  [name] is a 54 y.o. year old male who is a primary care patient of Darrick Huntsman, Mar Daring, MD and is actively engaged with the care management team. I reached out to [name] by phone today to assist with re-scheduling an initial visit with the Pharmacist  Follow up plan: Unsuccessful telephone outreach attempt made. A HIPPA compliant phone message was left for the patient providing contact information and requesting a return call.  The care management team will reach out to the patient again over the next 7 days.  If patient returns call to provider office, please advise to call Embedded Care Management Care Guide Penne Lash  at [phone]  Penne Lash, RMA Care Guide, Embedded Care Coordination [facility]  [place], [place] [address] Direct Dial: [phone] [email or web address]: [email or web address]

## 2020-03-11 ENCOUNTER — Encounter: Admission: RE | Admit: 2020-03-11 | Payer: 59 | Source: Ambulatory Visit

## 2020-03-11 NOTE — Chronic Care Management (AMB) (Signed)
  Care Management   Note  03/11/2020 Name: [name] MRN: [id number] DOB: 1966-01-18  Randy Grant is a 54 y.o. year old male who is a primary care patient of [place], Mar Daring, MD and is actively engaged with the care management team. I reached out to Randy Grant by phone today to assist with re-scheduling an initial visit with the Pharmacist  Follow up plan: Telephone appointment with care management team member scheduled for:03/13/2020  Penne Lash, RMA Care Guide, Embedded Care Coordination [facility]  [place], [place] [address] Direct Dial: [phone] [email or web address] Website: [email or web address]

## 2020-03-11 NOTE — Telephone Encounter (Signed)
Pt has been r/s for 03/13/2020 °

## 2020-03-13 ENCOUNTER — Telehealth: Payer: Self-pay | Admitting: Pharmacist

## 2020-03-13 ENCOUNTER — Telehealth: Payer: 59

## 2020-03-13 NOTE — Telephone Encounter (Signed)
°  Chronic Care Management   Note  03/13/2020 Name: Randy Grant MRN: [id number] DOB: 12/28/1965   Attempted to contact patient for scheduled appointment for medication management support. He noted that he was getting ready to go for work, and did not have the full 60 minutes to talk. Needs an appointment time around 2 pm. Rescheduled for next available 2 pm appt in ~3 weeks  Catie Feliz Beam, PharmD, [place], CPP Clinical Pharmacist [facility] [employer] [phone]

## 2020-03-15 ENCOUNTER — Telehealth: Payer: Self-pay | Admitting: Internal Medicine

## 2020-03-15 NOTE — Telephone Encounter (Signed)
Rejection Reason - Patient did not respond - I have been unable to reach this patient by phone. A letter is being sent. Per Radene Knee 02/05/2020" [facility] said 7 days ago

## 2020-03-18 DIAGNOSIS — E113213 Type 2 diabetes mellitus with mild nonproliferative diabetic retinopathy with macular edema, bilateral: Secondary | ICD-10-CM | POA: Diagnosis not present

## 2020-03-19 ENCOUNTER — Other Ambulatory Visit: Payer: Self-pay | Admitting: Internal Medicine

## 2020-03-21 ENCOUNTER — Emergency Department: Payer: 59

## 2020-03-21 ENCOUNTER — Encounter: Payer: Self-pay | Admitting: *Deleted

## 2020-03-21 ENCOUNTER — Other Ambulatory Visit: Payer: Self-pay

## 2020-03-21 ENCOUNTER — Observation Stay
Admission: EM | Admit: 2020-03-21 | Discharge: 2020-03-23 | Disposition: A | Discharge status: Home / Self Care | Payer: 59 | Attending: Internal Medicine | Admitting: Internal Medicine

## 2020-03-21 DIAGNOSIS — Z7982 Long term (current) use of aspirin: Secondary | ICD-10-CM | POA: Diagnosis not present

## 2020-03-21 DIAGNOSIS — Z20822 Contact with and (suspected) exposure to covid-19: Secondary | ICD-10-CM | POA: Insufficient documentation

## 2020-03-21 DIAGNOSIS — I10 Essential (primary) hypertension: Secondary | ICD-10-CM | POA: Diagnosis not present

## 2020-03-21 DIAGNOSIS — R110 Nausea: Secondary | ICD-10-CM | POA: Diagnosis not present

## 2020-03-21 DIAGNOSIS — R1013 Epigastric pain: Secondary | ICD-10-CM | POA: Diagnosis not present

## 2020-03-21 DIAGNOSIS — R112 Nausea with vomiting, unspecified: Secondary | ICD-10-CM | POA: Insufficient documentation

## 2020-03-21 DIAGNOSIS — R9431 Abnormal electrocardiogram [ECG] [EKG]: Secondary | ICD-10-CM | POA: Diagnosis not present

## 2020-03-21 DIAGNOSIS — E785 Hyperlipidemia, unspecified: Secondary | ICD-10-CM | POA: Diagnosis present

## 2020-03-21 DIAGNOSIS — Z79899 Other long term (current) drug therapy: Secondary | ICD-10-CM | POA: Insufficient documentation

## 2020-03-21 DIAGNOSIS — Z8546 Personal history of malignant neoplasm of prostate: Secondary | ICD-10-CM | POA: Insufficient documentation

## 2020-03-21 DIAGNOSIS — E11319 Type 2 diabetes mellitus with unspecified diabetic retinopathy without macular edema: Secondary | ICD-10-CM | POA: Insufficient documentation

## 2020-03-21 DIAGNOSIS — Z87891 Personal history of nicotine dependence: Secondary | ICD-10-CM | POA: Diagnosis not present

## 2020-03-21 DIAGNOSIS — R079 Chest pain, unspecified: Secondary | ICD-10-CM | POA: Diagnosis present

## 2020-03-21 DIAGNOSIS — K219 Gastro-esophageal reflux disease without esophagitis: Secondary | ICD-10-CM | POA: Diagnosis not present

## 2020-03-21 DIAGNOSIS — Z794 Long term (current) use of insulin: Secondary | ICD-10-CM | POA: Diagnosis not present

## 2020-03-21 DIAGNOSIS — E1165 Type 2 diabetes mellitus with hyperglycemia: Secondary | ICD-10-CM | POA: Insufficient documentation

## 2020-03-21 DIAGNOSIS — IMO0002 Reserved for concepts with insufficient information to code with codable children: Secondary | ICD-10-CM | POA: Diagnosis present

## 2020-03-21 DIAGNOSIS — E782 Mixed hyperlipidemia: Secondary | ICD-10-CM | POA: Diagnosis not present

## 2020-03-21 LAB — CBC
HCT: 43.7 % (ref 39.0–52.0)
Hemoglobin: 14.2 g/dL (ref 13.0–17.0)
MCH: 26.7 pg (ref 26.0–34.0)
MCHC: 32.5 g/dL (ref 30.0–36.0)
MCV: 82.3 fL (ref 80.0–100.0)
Platelets: 447 10*3/uL — ABNORMAL HIGH (ref 150–400)
RBC: 5.31 MIL/uL (ref 4.22–5.81)
RDW: 13.6 % (ref 11.5–15.5)
WBC: 8.2 10*3/uL (ref 4.0–10.5)
nRBC: 0 % (ref 0.0–0.2)

## 2020-03-21 LAB — BASIC METABOLIC PANEL
Anion gap: 13 (ref 5–15)
BUN: 11 mg/dL (ref 6–20)
CO2: 26 mmol/L (ref 22–32)
Calcium: 9.6 mg/dL (ref 8.9–10.3)
Chloride: 102 mmol/L (ref 98–111)
Creatinine, Ser: 0.74 mg/dL (ref 0.61–1.24)
GFR calc Af Amer: >60 mL/min (ref >60)
GFR calc non Af Amer: >60 mL/min (ref >60)
Glucose, Bld: 84 mg/dL (ref 70–99)
Potassium: 3.7 mmol/L (ref 3.5–5.1)
Sodium: 141 mmol/L (ref 135–145)

## 2020-03-21 LAB — PROTIME-INR
INR: 1 (ref 0.8–1.2)
Prothrombin Time: 12.9 seconds (ref 11.4–15.2)

## 2020-03-21 LAB — LIPID PANEL
Cholesterol: 211 mg/dL — ABNORMAL HIGH (ref 0–200)
HDL: 50 mg/dL (ref >40)
LDL Cholesterol: 131 mg/dL — ABNORMAL HIGH (ref 0–99)
Total CHOL/HDL Ratio: 4.2 RATIO
Triglycerides: 150 mg/dL — ABNORMAL HIGH (ref <150)
VLDL: 30 mg/dL (ref 0–40)

## 2020-03-21 LAB — TROPONIN I (HIGH SENSITIVITY)
Troponin I (High Sensitivity): <2 ng/L (ref <18)
Troponin I (High Sensitivity): 5 ng/L (ref <18)

## 2020-03-21 LAB — APTT: aPTT: 25 seconds (ref 24–36)

## 2020-03-21 MED ORDER — IRBESARTAN 150 MG PO TABS
150.0000 mg | ORAL_TABLET | Freq: Every day | ORAL | Status: DC
  Administered 2020-03-22 – 2020-03-23 (×2): 150 mg via ORAL
  Filled 2020-03-21 (×3): qty 1

## 2020-03-21 MED ORDER — EZETIMIBE 10 MG PO TABS
10.0000 mg | ORAL_TABLET | Freq: Every day | ORAL | Status: DC
  Administered 2020-03-22 – 2020-03-23 (×2): 10 mg via ORAL
  Filled 2020-03-21 (×3): qty 1

## 2020-03-21 MED ORDER — HEPARIN (PORCINE) 25000 UT/250ML-% IV SOLN: 10.0000 [IU]/kg/h | INTRAVENOUS | Status: DC

## 2020-03-21 MED ORDER — LIDOCAINE VISCOUS HCL 2 % MT SOLN
15.0000 mL | Freq: Once | OROMUCOSAL | Status: AC
  Administered 2020-03-21: 15 mL via ORAL
  Filled 2020-03-21: qty 15

## 2020-03-21 MED ORDER — PANTOPRAZOLE SODIUM 40 MG IV SOLR
40.0000 mg | Freq: Every day | INTRAVENOUS | Status: DC
  Administered 2020-03-22: 40 mg via INTRAVENOUS
  Filled 2020-03-21: qty 40

## 2020-03-21 MED ORDER — NITROGLYCERIN 2 % TD OINT
0.5000 [in_us] | TOPICAL_OINTMENT | Freq: Once | TRANSDERMAL | Status: AC
  Administered 2020-03-21: 0.5 [in_us] via TOPICAL
  Filled 2020-03-21: qty 1

## 2020-03-21 MED ORDER — INSULIN ASPART 100 UNIT/ML ~~LOC~~ SOLN
10.0000 [IU] | Freq: Three times a day (TID) | SUBCUTANEOUS | Status: DC
  Administered 2020-03-22 – 2020-03-23 (×2): 10 [IU] via SUBCUTANEOUS
  Filled 2020-03-21 (×3): qty 1

## 2020-03-21 MED ORDER — INSULIN ASPART 100 UNIT/ML ~~LOC~~ SOLN
0.0000 [IU] | Freq: Three times a day (TID) | SUBCUTANEOUS | Status: DC
  Administered 2020-03-22: 5 [IU] via SUBCUTANEOUS
  Administered 2020-03-23: 2 [IU] via SUBCUTANEOUS
  Filled 2020-03-21 (×2): qty 1

## 2020-03-21 MED ORDER — HEPARIN BOLUS VIA INFUSION
4000.0000 [IU] | Freq: Once | INTRAVENOUS | Status: AC
  Administered 2020-03-21: 4000 [IU] via INTRAVENOUS
  Filled 2020-03-21: qty 4000

## 2020-03-21 MED ORDER — INSULIN GLARGINE 100 UNIT/ML ~~LOC~~ SOLN
30.0000 [IU] | Freq: Every day | SUBCUTANEOUS | Status: DC
  Administered 2020-03-22: 30 [IU] via SUBCUTANEOUS
  Filled 2020-03-21 (×2): qty 0.3

## 2020-03-21 MED ORDER — ATORVASTATIN CALCIUM 20 MG PO TABS
20.0000 mg | ORAL_TABLET | Freq: Every day | ORAL | Status: DC
  Administered 2020-03-22: 20 mg via ORAL
  Filled 2020-03-21: qty 1

## 2020-03-21 MED ORDER — ASPIRIN 81 MG PO CHEW
243.0000 mg | CHEWABLE_TABLET | Freq: Once | ORAL | Status: AC
  Administered 2020-03-21: 243 mg via ORAL
  Filled 2020-03-21: qty 3

## 2020-03-21 MED ORDER — ONDANSETRON HCL 4 MG/2ML IJ SOLN
4.0000 mg | Freq: Four times a day (QID) | INTRAMUSCULAR | Status: DC | PRN
  Administered 2020-03-22: 4 mg via INTRAVENOUS
  Filled 2020-03-21: qty 2

## 2020-03-21 MED ORDER — INSULIN GLARGINE 100 UNIT/ML ~~LOC~~ SOLN: 35.0000 [IU] | Freq: Every day | SUBCUTANEOUS | Status: DC

## 2020-03-21 MED ORDER — ACETAMINOPHEN 325 MG PO TABS
650.0000 mg | ORAL_TABLET | ORAL | Status: DC | PRN
  Administered 2020-03-22: 650 mg via ORAL
  Filled 2020-03-21: qty 2

## 2020-03-21 MED ORDER — HEPARIN SODIUM (PORCINE) 5000 UNIT/ML IJ SOLN: 4000.0000 [IU] | Freq: Once | INTRAMUSCULAR | Status: DC

## 2020-03-21 MED ORDER — HEPARIN (PORCINE) 25000 UT/250ML-% IV SOLN
900.0000 [IU]/h | INTRAVENOUS | Status: DC
  Administered 2020-03-21 – 2020-03-22 (×2): 900 [IU]/h via INTRAVENOUS
  Filled 2020-03-21 (×2): qty 250

## 2020-03-21 MED ORDER — ASPIRIN EC 81 MG PO TBEC
81.0000 mg | DELAYED_RELEASE_TABLET | Freq: Every day | ORAL | Status: DC
  Administered 2020-03-22 – 2020-03-23 (×2): 81 mg via ORAL
  Filled 2020-03-21 (×2): qty 1

## 2020-03-21 MED ORDER — FENOFIBRATE 160 MG PO TABS
160.0000 mg | ORAL_TABLET | Freq: Every day | ORAL | Status: DC
  Administered 2020-03-22: 160 mg via ORAL
  Filled 2020-03-21 (×3): qty 1

## 2020-03-21 MED ORDER — INSULIN ASPART 100 UNIT/ML ~~LOC~~ SOLN: 0.0000 [IU] | Freq: Every day | SUBCUTANEOUS | Status: DC

## 2020-03-21 MED ORDER — ALUM & MAG HYDROXIDE-SIMETH 200-200-20 MG/5ML PO SUSP
30.0000 mL | Freq: Once | ORAL | Status: AC
  Administered 2020-03-21: 30 mL via ORAL
  Filled 2020-03-21: qty 30

## 2020-03-21 NOTE — ED Notes (Signed)
Pt dry heaving and then vomited 200cc in triage.

## 2020-03-21 NOTE — ED Notes (Addendum)
Admitting MD at the bedside for pt evaluation.  

## 2020-03-21 NOTE — ED Notes (Signed)
X-ray at bedside

## 2020-03-21 NOTE — ED Provider Notes (Signed)
[facility] Emergency Department Provider Note   ____________________________________________   None    (approximate)  I have reviewed the triage vital signs and the nursing notes.   HISTORY  Chief Complaint Abdominal Pain and Nausea    HPI Randy Grant is a 54 y.o. male who is working here in the hospital and begin to have severe epigastric pain and nausea.  Patient has a history of hyperlipidemia hypertension diabetes with neuropathy.  today he developed epigastric pain which was severe and vomiting.  It got worse when he walked.  And got better here in the emergency room after he got a GI cocktail.  He was not short of breath with it.  Did not seem to radiate.      Past Medical History:  Diagnosis Date  . Cataract 2017   left  . Diabetes mellitus   . GERD (gastroesophageal reflux disease)   . Hyperlipidemia   . Hypertension     Patient Active Problem List   Diagnosis Date Noted  . Tachycardia 02/24/2020  . Abnormal electrocardiogram 02/24/2020  . Uncontrolled type 2 diabetes mellitus with hyperglycemia (HCC) 02/24/2020  . Neuropathy associated with endocrine disorder (HCC) 01/29/2020  . Dyspnea on exertion 12/28/2019  . Anosmia 02/05/2018  . Constipation 12/20/2016  . Prostate cancer screening 10/17/2015  . Encounter for preventive health examination 09/20/2014  . Need for immunization against malaria 01/20/2014  . Erectile dysfunction associated with type 2 diabetes mellitus (HCC) 01/20/2014  . 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 Mixed hyperlipidemia 04/25/2007  . Essential hypertension 04/25/1999    No past surgical history on file.  Prior to Admission medications   Medication Sig Start Date End Date Taking? Authorizing Provider  ACCU-CHEK GUIDE test strip USE AS INSTRUCTED 08/01/19   Sherlene Shams, MD  aspirin 81 MG tablet Take 81 mg by mouth daily.      [provider]    atorvastatin (LIPITOR) 20 MG tablet Take 1 tablet (20 mg total) by mouth daily. 09/17/19   Sherlene Shams, MD  B-D ULTRAFINE III SHORT PEN 31G X 8 MM MISC TEST BLOOD SUGARS 2 TIMES DAILY 10/11/17   Sherlene Shams, MD  BAYER MICROLET LANCETS lancets USE AS DIRECTED 11/20/16   Sherlene Shams, MD  ezetimibe (ZETIA) 10 MG tablet Take 1 tablet (10 mg total) by mouth daily. 12/28/19   Sherlene Shams, MD  fenofibrate 160 MG tablet Take 1 tablet (160 mg total) by mouth at bedtime. 12/28/19   Sherlene Shams, MD  glucose blood (ONE TOUCH ULTRA TEST) test strip Use as instructed three times daily to check blood sugars 09/11/13   Sherlene Shams, MD  HUMALOG KWIKPEN 100 UNIT/ML KwikPen INJECT 14 UNITS TOTAL INTO THE SKIN 3 TIMES DAILY. 12/11/19   Sherlene Shams, MD  hydrochlorothiazide (HYDRODIURIL) 25 MG tablet Take 1 tablet (25 mg total) by mouth daily. 09/15/19   Sherlene Shams, MD  Insulin Glargine (BASAGLAR KWIKPEN) 100 UNIT/ML Inject 0.45 mLs (45 Units total) into the skin daily. 12/12/19   Sherlene Shams, MD  Insulin Pen Needle (B-D ULTRAFINE III SHORT PEN) 31G X 8 MM MISC Test blood sugars 2 times daily 01/17/14   Sherlene Shams, MD  metFORMIN (GLUCOPHAGE-XR) 500 MG 24 hr tablet TAKE 2 TABLETS BY MOUTH 2 TIMES DAILY. 12/11/19   Sherlene Shams, MD  Multiple Vitamin (MULTIVITAMIN) tablet Take 1 tablet by mouth daily.  [provider]  telmisartan (MICARDIS) 40 MG tablet TAKE 1 TABLET (40 MG TOTAL) BY MOUTH DAILY. 03/19/20   Sherlene Shams, MD  UNIFINE PENTIPS 31G X 8 MM MISC TEST BLOOD SUGARS 2 TIMES DAILY 12/11/19   Sherlene Shams, MD    Allergies Patient has no known allergies.  Family History  Problem Relation Age of Onset  . Diabetes Mother   . Hyperlipidemia Father   . Diabetes Father   . Diabetes Sister   . Diabetes Sister     Social History Social History   Tobacco Use  . Smoking status: Former Smoker    Quit date: 06/25/2001    Years since quitting: 18.7  . Smokeless  tobacco: Never Used  Vaping Use  . Vaping Use: Never used  Substance Use Topics  . Alcohol use: No    Alcohol/week: 0.0 standard drinks  . Drug use: No    Review of Systems  Constitutional: No fever/chills Eyes: No visual changes. ENT: No sore throat. Cardiovascular: Denies chest pain. Respiratory: Denies shortness of breath. Gastrointestinal:  abdominal pain.   nausea,  vomiting.  No diarrhea.  No constipation. Genitourinary: Negative for dysuria. Musculoskeletal: Negative for back pain. Skin: Negative for rash. Neurological: Negative for headaches, focal weakness  ____________________________________________   PHYSICAL EXAM:  VITAL SIGNS: ED Triage Vitals  Enc Vitals Group     BP 03/21/20 2023 136/84     Pulse Rate 03/21/20 2023 81     Resp 03/21/20 2023 16     Temp 03/21/20 2023 97.8 F (36.6 C)     Temp Source 03/21/20 2023 Oral     SpO2 03/21/20 2023 100 %     Weight --      Height --      Head Circumference --      Peak Flow --      Pain Score 03/21/20 2018 9     Pain Loc --      Pain Edu? --      Excl. in GC? --     Constitutional: Alert and oriented.  He is uncomfortable.  In the lobby he was described as being in a lot of pain. Eyes: Conjunctivae are normal.  Head: Atraumatic. Nose: No congestion/rhinnorhea. Mouth/Throat: Mucous membranes are moist.  Oropharynx non-erythematous. Neck: No stridor.   Cardiovascular: Normal rate, regular rhythm. Grossly normal heart sounds.  Good peripheral circulation. Respiratory: Normal respiratory effort.  No retractions. Lungs CTAB. Gastrointestinal: Soft and nontender. No distention. No abdominal bruits. No CVA tenderness. Musculoskeletal: No lower extremity tenderness nor edema.   Neurologic:  Normal speech and language. No gross focal neurologic deficits are appreciated. No gait instability. Skin:  Skin is warm, dry and intact.    ____________________________________________   LABS (all labs ordered are  listed, but only abnormal results are displayed)  Labs Reviewed  CBC - Abnormal; Notable for the following components:      Result Value   Platelets 447 (*)    All other components within normal limits  BASIC METABOLIC PANEL  PROTIME-INR  APTT  TROPONIN I (HIGH SENSITIVITY)   ____________________________________________  EKG  EKG #1 read interpreted by me shows normal sinus rhythm rate of 78 left axis right bundle branch block and left anterior hemiblock.  No acute changes __________________________ ____EKG #2 read interpreted by me shows normal sinus rhythm 84 left axis he now has flipped T waves in V2 and 3.  I questioned the nurses and they reported the stickers are in  the same spot.  ______________  RADIOLOGY  ED MD interpretation: Chest x-ray read by radiology reviewed by me shows no acute changes  Official radiology report(s): DG Chest Port 1 View  Result Date: 03/21/2020 CLINICAL DATA:  Epigastric pain, nausea. EXAM: PORTABLE CHEST 1 VIEW COMPARISON:  None. FINDINGS: The heart size and mediastinal contours are within normal limits. Both lungs are clear. The visualized skeletal structures are unremarkable. IMPRESSION: Normal study. Electronically Signed   By: Charlett Nose M.D.   On: 03/21/2020 21:00    ____________________________________________   PROCEDURES  Procedure(s) performed (including Critical Care):  Procedures   ____________________________________________   INITIAL IMPRESSION / ASSESSMENT AND PLAN / ED COURSE Patient with epigastric pain which is fairly severe worse with exertion and dynamic EKG changes.  I have given him aspirin and heparin and Nitropaste.  He is better now.  We will have to admit him.  While this could be as simple as an ulcer or gastritis it also could be his heart.  Could be unstable or new angina.  I will contact the hospitalist.              ____________________________________________   FINAL CLINICAL IMPRESSION(S) /  ED DIAGNOSES  Final diagnoses:  Epigastric pain  Abnormal EKG     ED Discharge Orders    None       Note:  This document was prepared using Dragon voice recognition software and may include unintentional dictation errors.    Arnaldo Natal, MD 03/21/20 2146

## 2020-03-21 NOTE — Progress Notes (Signed)
ANTICOAGULATION CONSULT NOTE - Initial Consult  Pharmacy Consult for Heparin  Indication: chest pain/ACS  No Known Allergies  Patient Measurements: Height: 5\' 6"  (167.6 cm) Weight: 76.7 kg (169 lb) IBW/kg (Calculated) : 63.8 Heparin Dosing Weight:  76.7 kg   Vital Signs: Temp: 97.8 F (36.6 C) (08/12 2023) Temp Source: Oral (08/12 2023) BP: 129/80 (08/12 2050) Pulse Rate: 74 (08/12 2050)  Labs: Recent Labs    03/21/20 2024  HGB 14.2  HCT 43.7  PLT 447*  CREATININE 0.74  TROPONINIHS <2    Estimated Creatinine Clearance: 100.6 mL/min (by C-G formula based on SCr of 0.74 mg/dL).   Medical History: Past Medical History:  Diagnosis Date  . Cataract 2017   left  . Diabetes mellitus   . GERD (gastroesophageal reflux disease)   . Hyperlipidemia   . Hypertension     Medications:  (Not in a hospital admission)   Assessment: Pharmacy consulted to dose heparin in this 54 year old male admitted with ACS/NSTEMI.  No prior anticoag noted. CrCl = 100.6 ml/min   Goal of Therapy:  Heparin level 0.3-0.7 units/ml Monitor platelets by anticoagulation protocol: Yes   Plan:  Give 4000 units bolus x 1 Start heparin infusion at 900 units/hr Check anti-Xa level in 6 hours and daily while on heparin Continue to monitor H&H and platelets  Jyl Chico D 03/21/2020,9:21 PM

## 2020-03-21 NOTE — H&P (Signed)
History and Physical    Randy Grant:[id number] DOB: 08/02/1966 DOA: 03/21/2020  PCP: Sherlene Shams, MD  Patient coming from: Work  I have personally briefly reviewed patient's old medical records in [facility]  Chief Complaint: Epigastric pain  HPI: Randy Grant is a 54 y.o. male with medical history significant for hypertension, insulin-dependent type 2 diabetes with retinopathy, and hyperlipidemia who presents with concerns of epigastric pain, nausea and vomiting.  Patient has been having heartburn symptoms for the past 2 days and has been taking over-the-counter medication.  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  Also felt dizzy and unsteady and had to rest.  He denies any associated shortness of breath.  He has had issues with heartburn in the past but never this severe. Patient reports that he has had complaints of heart palpitation and had planned stress test a few weeks ago but he missed his appointment. Denies any history of surgeries.  Denies tobacco, alcohol or illicit drug use.  States last bowel movement was yesterday. No personal family history of cardiac issues.  ED Course: He was afebrile normotensive on room air.  Initial troponin of less than 2.  Initial EKG showed right bundle branch block and left anterior fascicular block similar to prior in July.  However a repeat second EKG shows new flipped T waves in V2 and V3. Patient received GI cocktail as well as nitroglycerin patch in the ED and feels his symptom is improved. Lab work otherwise unremarkable.  Chest x-ray negative.  Review of Systems:  Constitutional: No Weight Change, No Fever ENT/Mouth: No sore throat, No Rhinorrhea Eyes: No Eye Pain, No Vision Changes Cardiovascular: No Chest Pain, no SOB Respiratory: No Cough, No Sputum, No Wheezing, no Dyspnea  Gastrointestinal: No  Nausea, No Vomiting, No Diarrhea, No Constipation, + Pain Genitourinary: no Urinary Incontinence Musculoskeletal: No Arthralgias, No Myalgias Skin: No Skin Lesions, No Pruritus, Neuro: no Weakness, No Numbness Psych: No Anxiety/Panic, No Depression, no decrease appetite Heme/Lymph: No Bruising, No Bleeding  Past Medical History:  Diagnosis Date  . Cataract 2017   left  . Diabetes mellitus   . GERD (gastroesophageal reflux disease)   . Hyperlipidemia   . Hypertension     No past surgical history   reports that he quit smoking about 18 years ago. He has never used smokeless tobacco. He reports that he does not drink alcohol and does not use drugs. Social History  No Known Allergies  Family History  Problem Relation Age of Onset  . Diabetes Mother   . Hyperlipidemia Father   . Diabetes Father   . Diabetes Sister   . Diabetes Sister      Prior to Admission medications   Medication Sig Start Date End Date Taking? Authorizing Provider  ACCU-CHEK GUIDE test strip USE AS INSTRUCTED 08/01/19   Sherlene Shams, MD  aspirin 81 MG tablet Take 81 mg by mouth daily.      [provider]  atorvastatin (LIPITOR) 20 MG tablet Take 1 tablet (20 mg total) by mouth daily. 09/17/19   Sherlene Shams, MD  B-D ULTRAFINE III SHORT PEN 31G X 8 MM MISC TEST BLOOD SUGARS 2 TIMES DAILY 10/11/17   Sherlene Shams, MD  BAYER MICROLET LANCETS lancets USE AS DIRECTED 11/20/16   Sherlene Shams, MD  ezetimibe (ZETIA) 10 MG tablet Take  1 tablet (10 mg total) by mouth daily. 12/28/19   Sherlene Shams, MD  fenofibrate 160 MG tablet Take 1 tablet (160 mg total) by mouth at bedtime. 12/28/19   Sherlene Shams, MD  glucose blood (ONE TOUCH ULTRA TEST) test strip Use as instructed three times daily to check blood sugars 09/11/13   Sherlene Shams, MD  HUMALOG KWIKPEN 100 UNIT/ML KwikPen INJECT 14 UNITS TOTAL INTO THE SKIN 3 TIMES DAILY. 12/11/19   Sherlene Shams, MD  hydrochlorothiazide (HYDRODIURIL) 25 MG  tablet Take 1 tablet (25 mg total) by mouth daily. 09/15/19   Sherlene Shams, MD  Insulin Glargine (BASAGLAR KWIKPEN) 100 UNIT/ML Inject 0.45 mLs (45 Units total) into the skin daily. 12/12/19   Sherlene Shams, MD  Insulin Pen Needle (B-D ULTRAFINE III SHORT PEN) 31G X 8 MM MISC Test blood sugars 2 times daily 01/17/14   Sherlene Shams, MD  metFORMIN (GLUCOPHAGE-XR) 500 MG 24 hr tablet TAKE 2 TABLETS BY MOUTH 2 TIMES DAILY. 12/11/19   Sherlene Shams, MD  Multiple Vitamin (MULTIVITAMIN) tablet Take 1 tablet by mouth daily.      [provider]  telmisartan (MICARDIS) 40 MG tablet TAKE 1 TABLET (40 MG TOTAL) BY MOUTH DAILY. 03/19/20   Sherlene Shams, MD  UNIFINE PENTIPS 31G X 8 MM MISC TEST BLOOD SUGARS 2 TIMES DAILY 12/11/19   Sherlene Shams, MD    Physical Exam: Vitals:   03/21/20 2023 03/21/20 2050 03/21/20 2130 03/21/20 2200  BP: 136/84 129/80 117/76 112/71  Pulse: 81 74 67 64  Resp: 16 15 18 11   Temp: 97.8 F (36.6 C)     TempSrc: Oral     SpO2: 100% 99% 96% 98%  Weight:  76.7 kg    Height:  5\' 6"  (1.676 m)      Constitutional: NAD, calm, comfortable, male laying flat in bed Vitals:   03/21/20 2023 03/21/20 2050 03/21/20 2130 03/21/20 2200  BP: 136/84 129/80 117/76 112/71  Pulse: 81 74 67 64  Resp: 16 15 18 11   Temp: 97.8 F (36.6 C)     TempSrc: Oral     SpO2: 100% 99% 96% 98%  Weight:  76.7 kg    Height:  5\' 6"  (1.676 m)     Eyes: PERRL, lids and conjunctivae normal ENMT: Mucous membranes are moist.  Neck: normal, supple, no masses, no thyromegaly Respiratory: clear to auscultation bilaterally, no wheezing, no crackles. Normal respiratory effort. No accessory muscle use.  Cardiovascular: Regular rate and rhythm, no murmurs / rubs / gallops. No extremity edema.   Abdomen: Moderate pain with palpation of the lower mid sternum and epigastric area, no masses palpated.  Bowel sounds positive.  Musculoskeletal: no clubbing / cyanosis. No joint deformity upper and lower  extremities. Good ROM, no contractures. Normal muscle tone.  Skin: no rashes, lesions, ulcers. No induration Neurologic: CN 2-12 grossly intact. Sensation intact, DTR normal. Strength 5/5 in all 4.  Psychiatric: Normal judgment and insight. Alert and oriented x 3. Normal mood.     Labs on Admission: I have personally reviewed following labs and imaging studies  CBC: Recent Labs  Lab 03/21/20 2024  WBC 8.2  HGB 14.2  HCT 43.7  MCV 82.3  PLT 447*   Basic Metabolic Panel: Recent Labs  Lab 03/21/20 2024  NA 141  K 3.7  CL 102  CO2 26  GLUCOSE 84  BUN 11  CREATININE 0.74  CALCIUM 9.6  GFR: Estimated Creatinine Clearance: 100.6 mL/min (by C-G formula based on SCr of 0.74 mg/dL). Liver Function Tests: No results for input(s): AST, ALT, ALKPHOS, BILITOT, PROT, ALBUMIN in the last 168 hours. No results for input(s): LIPASE, AMYLASE in the last 168 hours. No results for input(s): AMMONIA in the last 168 hours. Coagulation Profile: Recent Labs  Lab 03/21/20 2103  INR 1.0   Cardiac Enzymes: No results for input(s): CKTOTAL, CKMB, CKMBINDEX, TROPONINI in the last 168 hours. BNP (last 3 results) No results for input(s): PROBNP in the last 8760 hours. HbA1C: No results for input(s): HGBA1C in the last 72 hours. CBG: No results for input(s): GLUCAP in the last 168 hours. Lipid Profile: No results for input(s): CHOL, HDL, LDLCALC, TRIG, CHOLHDL, LDLDIRECT in the last 72 hours. Thyroid Function Tests: No results for input(s): TSH, T4TOTAL, FREET4, T3FREE, THYROIDAB in the last 72 hours. Anemia Panel: No results for input(s): VITAMINB12, FOLATE, FERRITIN, TIBC, IRON, RETICCTPCT in the last 72 hours. Urine analysis:    Component Value Date/Time   COLORURINE LT. YELLOW 12/08/2012 0912   APPEARANCEUR CLEAR 12/08/2012 0912   LABSPEC 1.025 12/08/2012 0912   PHURINE 6.0 12/08/2012 0912   GLUCOSEU NEGATIVE 12/08/2012 0912   HGBUR NEGATIVE 12/08/2012 0912   BILIRUBINUR  NEGATIVE 12/08/2012 0912   KETONESUR NEGATIVE 12/08/2012 0912   UROBILINOGEN 0.2 12/08/2012 0912   NITRITE NEGATIVE 12/08/2012 0912   LEUKOCYTESUR NEGATIVE 12/08/2012 0912    Radiological Exams on Admission: DG Chest Port 1 View  Result Date: 03/21/2020 CLINICAL DATA:  Epigastric pain, nausea. EXAM: PORTABLE CHEST 1 VIEW COMPARISON:  None. FINDINGS: The heart size and mediastinal contours are within normal limits. Both lungs are clear. The visualized skeletal structures are unremarkable. IMPRESSION: Normal study. Electronically Signed   By: Charlett Nose M.D.   On: 03/21/2020 21:00      Assessment/Plan  Suspected NSTEMI Patient with dizziness, epigastric/lower midsternal pain with EKG changes in V2 to V3 with flipped T waves.  Troponin so far negative however symptoms are suspicious for NSTEMI.  Although peptic ulcer also on differential.  Will also obtain labs for Helicobacter pylori. We will keep on heparin infusion Continue to trend troponin PRN EKG for recurrent chest pain Likely cardiology consult in the morning.  Keep n.p.o. after midnight.  Insulin dependent type 2 diabetes with retinopathy Hemoglobin A1c of 9.3 in May Home regimen: Basaglar 45 units nightly, Humalog 18 units 3 times daily Start with Lantus 30 units here and 10 units Novolog at mealtime  Hypertension Continue home antihypertensives  Hyperlipidemia Continue statin  DVT prophylaxis:.Lovenox Code Status: Full Family Communication: Plan discussed with patient at bedside  disposition Plan: Home with observation Consults called:  Admission status: Observation  Status is: Observation  The patient remains OBS appropriate and will d/c before 2 midnights.  Dispo: The patient is from: Home              Anticipated d/c is to: Home              Anticipated d/c date is: 1 day              Patient currently is not medically stable to d/c.         Anselm Jungling DO [facility]   If 7PM-7AM,  please contact night-coverage [email or web address]   03/21/2020, 10:32 PM

## 2020-03-21 NOTE — ED Triage Notes (Signed)
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  Pt states that it began making him nauseated and he also worried about his blood sugar. Pt appears very uncomfortable at this time and he states that it feels like severe heartburn and his nausea is the biggest complaint.  No sob at this time.

## 2020-03-22 ENCOUNTER — Observation Stay (HOSPITAL_BASED_OUTPATIENT_CLINIC_OR_DEPARTMENT_OTHER)
Admit: 2020-03-22 | Discharge: 2020-03-22 | Disposition: A | Discharge status: Home / Self Care | Payer: 59 | Attending: Cardiovascular Disease | Admitting: Cardiovascular Disease

## 2020-03-22 ENCOUNTER — Encounter: Payer: Self-pay | Admitting: Family Medicine

## 2020-03-22 DIAGNOSIS — K219 Gastro-esophageal reflux disease without esophagitis: Secondary | ICD-10-CM | POA: Diagnosis not present

## 2020-03-22 DIAGNOSIS — I10 Essential (primary) hypertension: Secondary | ICD-10-CM | POA: Diagnosis not present

## 2020-03-22 DIAGNOSIS — Z87891 Personal history of nicotine dependence: Secondary | ICD-10-CM | POA: Diagnosis not present

## 2020-03-22 DIAGNOSIS — Z20822 Contact with and (suspected) exposure to covid-19: Secondary | ICD-10-CM | POA: Diagnosis not present

## 2020-03-22 DIAGNOSIS — Z794 Long term (current) use of insulin: Secondary | ICD-10-CM | POA: Diagnosis not present

## 2020-03-22 DIAGNOSIS — R42 Dizziness and giddiness: Secondary | ICD-10-CM | POA: Diagnosis not present

## 2020-03-22 DIAGNOSIS — R1013 Epigastric pain: Secondary | ICD-10-CM

## 2020-03-22 DIAGNOSIS — E11319 Type 2 diabetes mellitus with unspecified diabetic retinopathy without macular edema: Secondary | ICD-10-CM | POA: Diagnosis not present

## 2020-03-22 DIAGNOSIS — R079 Chest pain, unspecified: Secondary | ICD-10-CM | POA: Diagnosis not present

## 2020-03-22 DIAGNOSIS — E782 Mixed hyperlipidemia: Secondary | ICD-10-CM | POA: Diagnosis not present

## 2020-03-22 DIAGNOSIS — R112 Nausea with vomiting, unspecified: Secondary | ICD-10-CM | POA: Diagnosis not present

## 2020-03-22 DIAGNOSIS — E1165 Type 2 diabetes mellitus with hyperglycemia: Secondary | ICD-10-CM | POA: Diagnosis not present

## 2020-03-22 DIAGNOSIS — R9431 Abnormal electrocardiogram [ECG] [EKG]: Secondary | ICD-10-CM | POA: Diagnosis not present

## 2020-03-22 LAB — NM MYOCAR MULTI W/SPECT W/WALL MOTION / EF
Estimated workload: 1 METS
Exercise duration (min): 0 min
Exercise duration (sec): 0 s
LV dias vol: 82 mL (ref 62–150)
LV sys vol: 25 mL
MPHR: 164 {beats}/min
Peak HR: 116 {beats}/min
Percent HR: 70 %
Rest HR: 62 {beats}/min
SDS: 0
SRS: 4
SSS: 0
TID: 1.02

## 2020-03-22 LAB — ECHOCARDIOGRAM COMPLETE
AR max vel: 2.57 cm2
AV Area VTI: 2.94 cm2
AV Area mean vel: 2.79 cm2
AV Mean grad: 3 mmHg
AV Peak grad: 5.6 mmHg
Ao pk vel: 1.18 m/s
Area-P 1/2: 4.04 cm2
Height: 66 in
S' Lateral: 2.43 cm
Weight: 2529.6 oz

## 2020-03-22 LAB — GLUCOSE, CAPILLARY
Glucose-Capillary: 101 mg/dL — ABNORMAL HIGH (ref 70–99)
Glucose-Capillary: 92 mg/dL (ref 70–99)
Glucose-Capillary: 79 mg/dL (ref 70–99)
Glucose-Capillary: 227 mg/dL — ABNORMAL HIGH (ref 70–99)
Glucose-Capillary: 150 mg/dL — ABNORMAL HIGH (ref 70–99)

## 2020-03-22 LAB — HIV ANTIBODY (ROUTINE TESTING W REFLEX): HIV Screen 4th Generation wRfx: NONREACTIVE

## 2020-03-22 LAB — TROPONIN I (HIGH SENSITIVITY)
Troponin I (High Sensitivity): 3 ng/L (ref <18)
Troponin I (High Sensitivity): 2 ng/L (ref <18)

## 2020-03-22 LAB — HEPARIN LEVEL (UNFRACTIONATED)
Heparin Unfractionated: 0.53 IU/mL (ref 0.30–0.70)
Heparin Unfractionated: 0.61 IU/mL (ref 0.30–0.70)

## 2020-03-22 LAB — SARS CORONAVIRUS 2 BY RT PCR (HOSPITAL ORDER, PERFORMED IN ~~LOC~~ HOSPITAL LAB): SARS Coronavirus 2: NEGATIVE

## 2020-03-22 MED ORDER — TECHNETIUM TC 99M TETROFOSMIN IV KIT
10.0000 | PACK | Freq: Once | INTRAVENOUS | Status: AC | PRN
  Administered 2020-03-22: 10.003 via INTRAVENOUS

## 2020-03-22 MED ORDER — TECHNETIUM TC 99M TETROFOSMIN IV KIT
30.9900 | PACK | Freq: Once | INTRAVENOUS | Status: AC | PRN
  Administered 2020-03-22: 30.99 via INTRAVENOUS

## 2020-03-22 MED ORDER — REGADENOSON 0.4 MG/5ML IV SOLN
0.4000 mg | Freq: Once | INTRAVENOUS | Status: AC
  Administered 2020-03-22: 0.4 mg via INTRAVENOUS

## 2020-03-22 MED ORDER — MECLIZINE HCL 25 MG PO TABS
25.0000 mg | ORAL_TABLET | Freq: Three times a day (TID) | ORAL | Status: DC
  Administered 2020-03-22 – 2020-03-23 (×5): 25 mg via ORAL
  Filled 2020-03-22 (×7): qty 1

## 2020-03-22 NOTE — Progress Notes (Signed)
Patient arrived back to room from stress test. States he feels okay. Asking for food. Contacted MD Mayford Knife, she ordered carb modified.

## 2020-03-22 NOTE — Consult Note (Addendum)
Cardiology Consultation:   Patient ID: [name] MRN: [id number]; DOB: September 29, 1965  Admit date: 03/21/2020 Date of Consult: 03/22/2020  Primary Care Provider: Sherlene Shams, MD [facility] Cardiologist: [facility] Physician requesting consult: Dr. Mayford Knife Reason for consult: Chest pain   Patient Profile:   Randy Grant is a 54 y.o. 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  Documentation that he vomited in the ER  He received a GI cocktail No radiation of his pain Concern for abnormal EKG in the emergency room, was treated with aspirin heparin Nitropaste, admitted for rule out  Also reports having significant dizziness, unsteady on his feet Reports history of heart palpitations   Past Medical History:  Diagnosis Date  . Cataract 2017   left  . Diabetes mellitus   . GERD (gastroesophageal reflux disease)   . Hyperlipidemia   . Hypertension     History reviewed. No pertinent surgical history.   Home Medications:  Prior to Admission medications   Medication Sig Start Date End Date Taking? Authorizing Provider  aspirin 81 MG tablet Take 81 mg by mouth daily.     Yes [provider]  atorvastatin (LIPITOR) 20 MG tablet Take 1 tablet (20 mg total) by mouth daily. 09/17/19  Yes Sherlene Shams, MD  ezetimibe (ZETIA) 10 MG tablet Take 1 tablet (10 mg total) by mouth daily. 12/28/19  Yes Sherlene Shams, MD  fenofibrate 160 MG tablet Take 1 tablet (160 mg total) by mouth at bedtime. 12/28/19  Yes Sherlene Shams, MD  HUMALOG KWIKPEN 100 UNIT/ML KwikPen INJECT 14 UNITS TOTAL INTO THE SKIN 3 TIMES DAILY. 12/11/19  Yes Sherlene Shams, MD  Insulin Glargine (BASAGLAR KWIKPEN) 100 UNIT/ML Inject 0.45 mLs (45 Units total) into the skin daily. Patient taking differently: Inject 45 Units into the skin at bedtime.  12/12/19  Yes Sherlene Shams, MD  metFORMIN (GLUCOPHAGE-XR) 500 MG 24 hr tablet TAKE 2 TABLETS BY MOUTH 2 TIMES DAILY. 12/11/19  Yes Sherlene Shams, MD  Multiple Vitamin (MULTIVITAMIN) tablet Take 1 tablet by mouth daily.     Yes [provider]  telmisartan (MICARDIS) 40 MG tablet TAKE 1 TABLET (40 MG TOTAL) BY MOUTH DAILY. 03/19/20  Yes Sherlene Shams, MD  ACCU-CHEK GUIDE test strip USE AS INSTRUCTED 08/01/19   Sherlene Shams, MD  B-D ULTRAFINE III SHORT PEN 31G X 8 MM MISC TEST BLOOD SUGARS 2 TIMES DAILY 10/11/17   Sherlene Shams, MD  BAYER MICROLET LANCETS lancets USE AS DIRECTED 11/20/16   Sherlene Shams, MD  glucose blood (ONE TOUCH ULTRA TEST) test strip Use as instructed three times daily to check blood sugars 09/11/13   Sherlene Shams, MD  hydrochlorothiazide (HYDRODIURIL) 25 MG tablet Take 1 tablet (25 mg total) by mouth daily. Patient not taking: Reported on 03/21/2020 09/15/19   Sherlene Shams, MD  Insulin Pen Needle (B-D ULTRAFINE III SHORT PEN) 31G X 8 MM MISC Test blood  sugars 2 times daily 01/17/14   Sherlene Shams, MD  UNIFINE PENTIPS 31G X 8 MM MISC TEST BLOOD SUGARS 2 TIMES DAILY 12/11/19   Sherlene Shams, MD    Inpatient Medications: Scheduled Meds: . aspirin EC  81 mg Oral Daily  . atorvastatin  20 mg Oral Daily  . ezetimibe  10 mg Oral Daily  . fenofibrate  160 mg Oral QHS  . insulin aspart  0-15 Units Subcutaneous TID WC  . insulin aspart  0-5 Units Subcutaneous QHS  . insulin aspart  10 Units Subcutaneous TID WC  . insulin glargine  30 Units Subcutaneous  QHS  . irbesartan  150 mg Oral Daily  . meclizine  25 mg Oral TID  . pantoprazole (PROTONIX) IV  40 mg Intravenous QHS   Continuous Infusions: . heparin 900 Units/hr (03/21/20 2233)   PRN Meds: acetaminophen, ondansetron (ZOFRAN) IV  Allergies:   No Known Allergies  Social History:   Social History   Socioeconomic History  . Marital status: Married    Spouse name: Not on file  . Number of children: Not on file  . Years of education: Not on file  . Highest education level: Not on file  Occupational History  . Not on file  Tobacco Use  . Smoking status: Former Smoker    Quit date: 06/25/2001    Years since quitting: 18.7  . Smokeless tobacco: Never Used  Vaping Use  . Vaping Use: Never used  Substance and Sexual Activity  . Alcohol use: No    Alcohol/week: 0.0 standard drinks  . Drug use: No  . Sexual activity: Not on file  Other Topics Concern  . Not on file  Social History Narrative  . Not on file   Social Determinants of Health   Financial Resource Strain:   . Difficulty of Paying Living Expenses:   Food Insecurity:   . Worried About Programme researcher, broadcasting/film/video in the Last Year:   . Barista in the Last Year:   Transportation Needs:   . Freight forwarder (Medical):   Marland Kitchen Lack of Transportation (Non-Medical):   Physical Activity:   . Days of Exercise per Week:   . Minutes of Exercise per Session:   Stress:   . Feeling of Stress :   Social Connections:   . Frequency of Communication with Friends and Family:   . Frequency of Social Gatherings with Friends and Family:   . Attends Religious Services:   . Active Member of Clubs or Organizations:   . Attends Banker Meetings:   Marland Kitchen Marital Status:   Intimate Partner Violence:   . Fear of Current or Ex-Partner:   . Emotionally Abused:   Marland Kitchen Physically Abused:   . Sexually Abused:     Family History:    Family History  Problem Relation Age of Onset  . Diabetes Mother   . Hyperlipidemia  Father   . Diabetes Father   . Diabetes Sister   . Diabetes Sister      ROS:  Please see the history of present illness.  Review of Systems  Constitutional: Negative.   HENT: Negative.   Respiratory: Negative.   Cardiovascular: Positive for chest pain.  Gastrointestinal: Positive for nausea and vomiting.  Musculoskeletal: Negative.   Neurological: Positive for dizziness.  Psychiatric/Behavioral: Negative.   All other systems reviewed and are negative.    Physical Exam/Data:   Vitals:   03/22/20 0300 03/22/20 0439 03/22/20 0817 03/22/20  1131  BP:  111/71 117/78 127/75  Pulse:  64 60 (!) 57  Resp:  18 18 17   Temp:  97.7 F (36.5 C) 97.8 F (36.6 C) 97.8 F (36.6 C)  TempSrc:  Oral    SpO2:  98% 100% 100%  Weight: 71.7 kg     Height: 5\' 6"  (1.676 m)       Intake/Output Summary (Last 24 hours) at 03/22/2020 1310 Last data filed at 03/22/2020 1131 Gross per 24 hour  Intake 164.54 ml  Output 1100 ml  Net -935.46 ml   Last 3 Weights 03/22/2020 03/21/2020 02/23/2020  Weight (lbs) 158 lb 1.6 oz 169 lb 163 lb 2 oz  Weight (kg) 71.714 kg 76.658 kg 73.993 kg     Body mass index is 25.52 kg/m.  General:  Well nourished, well developed, in no acute distress HEENT: normal Lymph: no adenopathy Neck: no JVD Endocrine:  No thryomegaly Vascular: No carotid bruits; FA pulses 2+ bilaterally without bruits  Cardiac:  normal S1, S2; RRR; no murmur  Lungs:  clear to auscultation bilaterally, no wheezing, rhonchi or rales  Abd: soft, nontender, no hepatomegaly  Ext: no edema Musculoskeletal:  No deformities, BUE and BLE strength normal and equal Skin: warm and dry  Neuro:  CNs 2-12 intact, no focal abnormalities noted Psych:  Normal affect   EKG:  The EKG was personally reviewed and demonstrates:   Normal sinus rhythm with right bundle branch block, left anterior fascicular block  Telemetry:  Telemetry was personally reviewed and demonstrates: Normal sinus rhythm  Relevant CV  Studies:   Laboratory Data:  High Sensitivity Troponin:   Recent Labs  Lab 03/21/20 2024 03/21/20 2319 03/22/20 0820 03/22/20 1058  TROPONINIHS 2 5 3 2      Chemistry Recent Labs  Lab 03/21/20 2024  NA 141  K 3.7  CL 102  CO2 26  GLUCOSE 84  BUN 11  CREATININE 0.74  CALCIUM 9.6  GFRNONAA >60  GFRAA >60  ANIONGAP 13    No results for input(s): PROT, ALBUMIN, AST, ALT, ALKPHOS, BILITOT in the last 168 hours. Hematology Recent Labs  Lab 03/21/20 2024  WBC 8.2  RBC 5.31  HGB 14.2  HCT 43.7  MCV 82.3  MCH 26.7  MCHC 32.5  RDW 13.6  PLT 447*   BNPNo results for input(s): BNP, PROBNP in the last 168 hours.  DDimer No results for input(s): DDIMER in the last 168 hours.   Radiology/Studies:  [facility] Chest Port 1 View  Result Date: 03/21/2020 CLINICAL DATA:  Epigastric pain, nausea. EXAM: PORTABLE CHEST 1 VIEW COMPARISON:  None. FINDINGS: The heart size and mediastinal contours are within normal limits. Both lungs are clear. The visualized skeletal structures are unremarkable. IMPRESSION: Normal study. Electronically Signed   By: [facility] M.D.   On: 03/21/2020 21:00   {   Assessment and Plan:   1.  Chest pain Like enzymes negative x2 Resting EKG with right bundle branch block, left anterior fascicular block Risk factors including hyperlipidemia, diabetes Recently seen by outpatient cardiology, stress test and echocardiogram was recommended at that time He missed his appointment for my review Given some chest pain and upper epigastric symptoms, we will order stress test this admission Given dizziness/vertigo, not a good candidate for exercise/treadmill ---echo: ef >55%, no valve disease  ----Addendum Stress test with no ischemia, low risk study, normal ejection fraction Message sent to hospitalist service  2. Dizziness On further discussion, symptoms concerning for vertigo Worse after several  episodes of vomiting Consider use of meclizine If symptoms  persist may need ENT  3. Diabetes type 2 poorly controlled Hemoglobin A1c 9.3 Lifestyle modification recommended Close follow-up with primary care  4.  Essential hypertension On ARB, HCTZ blood pressure stable  5.  Hyperlipidemia On Lipitor, Zetia, fenofibrate   Total encounter time more than 110 minutes  Greater than 50% was spent in counseling and coordination of care with the patient   For questions or updates, please contact [facility] Please consult [email or web address] for contact info under    Signed, Julien Nordmannimothy Fidel Caggiano, MD  03/22/2020 1:10 PM

## 2020-03-22 NOTE — Progress Notes (Signed)
Patient assessed this afternoon. States he still feels dizzy and unsteady on feet at side of bed. MD Mayford Knife reported that Echo and Stress test came back clean. Plans to keep him today due to dizziness and re assess appropriateness of discharge on Saturday.

## 2020-03-22 NOTE — Progress Notes (Signed)
ANTICOAGULATION CONSULT NOTE   Pharmacy Consult for Heparin  Indication: chest pain/ACS  No Known Allergies  Patient Measurements: Height: 5\' 6"  (167.6 cm) Weight: 71.7 kg (158 lb 1.6 oz) IBW/kg (Calculated) : 63.8 Heparin Dosing Weight:  76.7 kg   Vital Signs: Temp: 97.8 F (36.6 C) (08/13 1131) Temp Source: Oral (08/13 0439) BP: 127/75 (08/13 1131) Pulse Rate: 57 (08/13 1131)  Labs: Recent Labs    03/21/20 2024 03/21/20 2024 03/21/20 2103 03/21/20 2319 03/22/20 0519 03/22/20 0820 03/22/20 1058  HGB 14.2  --   --   --   --   --   --   HCT 43.7  --   --   --   --   --   --   PLT 447*  --   --   --   --   --   --   APTT  --   --  25  --   --   --   --   LABPROT  --   --  12.9  --   --   --   --   INR  --   --  1.0  --   --   --   --   HEPARINUNFRC  --   --   --   --  0.53  --  0.61  CREATININE 0.74  --   --   --   --   --   --   TROPONINIHS <2   < >  --  5  --  3 2   < > = values in this interval not displayed.    Estimated Creatinine Clearance: 93 mL/min (by C-G formula based on SCr of 0.74 mg/dL).   Medical History: Past Medical History:  Diagnosis Date  . Cataract 2017   left  . Diabetes mellitus   . GERD (gastroesophageal reflux disease)   . Hyperlipidemia   . Hypertension     Medications:  Medications Prior to Admission  Medication Sig Dispense Refill Last Dose  . aspirin 81 MG tablet Take 81 mg by mouth daily.     03/21/2020 at Unknown time  . atorvastatin (LIPITOR) 20 MG tablet Take 1 tablet (20 mg total) by mouth daily. 90 tablet 3 03/21/2020 at 1500  . ezetimibe (ZETIA) 10 MG tablet Take 1 tablet (10 mg total) by mouth daily. 90 tablet 1 03/21/2020 at 1500  . fenofibrate 160 MG tablet Take 1 tablet (160 mg total) by mouth at bedtime. 90 tablet 1 03/20/2020 at Unknown time  . HUMALOG KWIKPEN 100 UNIT/ML KwikPen INJECT 14 UNITS TOTAL INTO THE SKIN 3 TIMES DAILY. 30 mL 2 03/21/2020 at 1500  . Insulin Glargine (BASAGLAR KWIKPEN) 100 UNIT/ML Inject 0.45  mLs (45 Units total) into the skin daily. (Patient taking differently: Inject 45 Units into the skin at bedtime. ) 15 pen 1 03/20/2020 at Unknown time  . metFORMIN (GLUCOPHAGE-XR) 500 MG 24 hr tablet TAKE 2 TABLETS BY MOUTH 2 TIMES DAILY. 360 tablet 1 03/21/2020 at 1500  . Multiple Vitamin (MULTIVITAMIN) tablet Take 1 tablet by mouth daily.     03/21/2020 at 1500  . telmisartan (MICARDIS) 40 MG tablet TAKE 1 TABLET (40 MG TOTAL) BY MOUTH DAILY. 90 tablet 0 03/20/2020 at Unknown time  . ACCU-CHEK GUIDE test strip USE AS INSTRUCTED 100 strip 5   . B-D ULTRAFINE III SHORT PEN 31G X 8 MM MISC TEST BLOOD SUGARS 2 TIMES DAILY 100 each 6   .  BAYER MICROLET LANCETS lancets USE AS DIRECTED 100 each 5   . glucose blood (ONE TOUCH ULTRA TEST) test strip Use as instructed three times daily to check blood sugars 100 each 12   . hydrochlorothiazide (HYDRODIURIL) 25 MG tablet Take 1 tablet (25 mg total) by mouth daily. (Patient not taking: Reported on 03/21/2020) 60 tablet 3 Not Taking at Unknown time  . Insulin Pen Needle (B-D ULTRAFINE III SHORT PEN) 31G X 8 MM MISC Test blood sugars 2 times daily 100 each 5   . UNIFINE PENTIPS 31G X 8 MM MISC TEST BLOOD SUGARS 2 TIMES DAILY 100 each 5     Assessment: Pharmacy consulted to dose heparin in this 54 year old male admitted with ACS/NSTEMI.  No prior anticoag noted. CrCl = 100.6 ml/min   8/13 0519 HL 0.53 8/13 1058 HL 0.61  Goal of Therapy:  Heparin level 0.3-0.7 units/ml Monitor platelets by anticoagulation protocol: Yes   Plan:  Heparin level is therapeutic. Will continue current rate (900 units/hr) and will recheck HL and CBC with AM labs.  Paschal Dopp, PharmD, BCPS Clinical Pharmacist 03/22/2020,12:23 PM

## 2020-03-22 NOTE — Progress Notes (Signed)
*  PRELIMINARY RESULTS* Echocardiogram 2D Echocardiogram has been performed.  Joanette Gula Audrianna Driskill 03/22/2020, 9:43 AM

## 2020-03-22 NOTE — Progress Notes (Signed)
ANTICOAGULATION CONSULT NOTE - Initial Consult  Pharmacy Consult for Heparin  Indication: chest pain/ACS  No Known Allergies  Patient Measurements: Height: 5\' 6"  (167.6 cm) Weight: 71.7 kg (158 lb 1.6 oz) IBW/kg (Calculated) : 63.8 Heparin Dosing Weight:  76.7 kg   Vital Signs: Temp: 97.7 F (36.5 C) (08/13 0439) Temp Source: Oral (08/13 0439) BP: 111/71 (08/13 0439) Pulse Rate: 64 (08/13 0439)  Labs: Recent Labs    03/21/20 2024 03/21/20 2103 03/21/20 2319 03/22/20 0519  HGB 14.2  --   --   --   HCT 43.7  --   --   --   PLT 447*  --   --   --   APTT  --  25  --   --   LABPROT  --  12.9  --   --   INR  --  1.0  --   --   HEPARINUNFRC  --   --   --  0.53  CREATININE 0.74  --   --   --   TROPONINIHS <2  --  5  --     Estimated Creatinine Clearance: 93 mL/min (by C-G formula based on SCr of 0.74 mg/dL).   Medical History: Past Medical History:  Diagnosis Date  . Cataract 2017   left  . Diabetes mellitus   . GERD (gastroesophageal reflux disease)   . Hyperlipidemia   . Hypertension     Medications:  Medications Prior to Admission  Medication Sig Dispense Refill Last Dose  . aspirin 81 MG tablet Take 81 mg by mouth daily.     03/21/2020 at Unknown time  . atorvastatin (LIPITOR) 20 MG tablet Take 1 tablet (20 mg total) by mouth daily. 90 tablet 3 03/21/2020 at 1500  . ezetimibe (ZETIA) 10 MG tablet Take 1 tablet (10 mg total) by mouth daily. 90 tablet 1 03/21/2020 at 1500  . fenofibrate 160 MG tablet Take 1 tablet (160 mg total) by mouth at bedtime. 90 tablet 1 03/20/2020 at Unknown time  . HUMALOG KWIKPEN 100 UNIT/ML KwikPen INJECT 14 UNITS TOTAL INTO THE SKIN 3 TIMES DAILY. 30 mL 2 03/21/2020 at 1500  . Insulin Glargine (BASAGLAR KWIKPEN) 100 UNIT/ML Inject 0.45 mLs (45 Units total) into the skin daily. (Patient taking differently: Inject 45 Units into the skin at bedtime. ) 15 pen 1 03/20/2020 at Unknown time  . metFORMIN (GLUCOPHAGE-XR) 500 MG 24 hr tablet TAKE 2  TABLETS BY MOUTH 2 TIMES DAILY. 360 tablet 1 03/21/2020 at 1500  . Multiple Vitamin (MULTIVITAMIN) tablet Take 1 tablet by mouth daily.     03/21/2020 at 1500  . telmisartan (MICARDIS) 40 MG tablet TAKE 1 TABLET (40 MG TOTAL) BY MOUTH DAILY. 90 tablet 0 03/20/2020 at Unknown time  . ACCU-CHEK GUIDE test strip USE AS INSTRUCTED 100 strip 5   . B-D ULTRAFINE III SHORT PEN 31G X 8 MM MISC TEST BLOOD SUGARS 2 TIMES DAILY 100 each 6   . BAYER MICROLET LANCETS lancets USE AS DIRECTED 100 each 5   . glucose blood (ONE TOUCH ULTRA TEST) test strip Use as instructed three times daily to check blood sugars 100 each 12   . hydrochlorothiazide (HYDRODIURIL) 25 MG tablet Take 1 tablet (25 mg total) by mouth daily. (Patient not taking: Reported on 03/21/2020) 60 tablet 3 Not Taking at Unknown time  . Insulin Pen Needle (B-D ULTRAFINE III SHORT PEN) 31G X 8 MM MISC Test blood sugars 2 times daily 100 each 5   .  UNIFINE PENTIPS 31G X 8 MM MISC TEST BLOOD SUGARS 2 TIMES DAILY 100 each 5     Assessment: Pharmacy consulted to dose heparin in this 54 year old male admitted with ACS/NSTEMI.  No prior anticoag noted. CrCl = 100.6 ml/min   Goal of Therapy:  Heparin level 0.3-0.7 units/ml Monitor platelets by anticoagulation protocol: Yes   Plan:  08/13 @ 0500 HL 0.53 therapeutic. Will continue current rate and will recheck HL at 1100 and will continue to monitor.  Thomasene Ripple, PharmD, BCPS Clinical Pharmacist 03/22/2020,6:55 AM

## 2020-03-22 NOTE — Progress Notes (Signed)
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 EKG changes shows flipped T waves. Continue on IV heparin. Continue on tele. Echo pending. Stress test ordered as per cardio.   Dizziness: etiology unclear, possibly BPPV. Started on meclizine   DM2: HbA1c 9.3, poorly controlled. Continue on lantus, novolog & SSI w/ accuchecks  HTN: continue on irbesartan   HLD: continue on statin   Thrombocytosis: etiology unclear. Will continue to monitor     DVT prophylaxis: heparin drip  Code Status: full  Family Communication:  Disposition Plan:  Depends on echo and stress test findings. Likely d/c back home    Consultants:   Cardio    Procedures:    Antimicrobials:   Subjective: Pt c/o dizziness   Objective: Vitals:   03/22/20 0300 03/22/20 0439 03/22/20 0817 03/22/20 1131  BP:  111/71 117/78 127/75  Pulse:  64 60 (!) 57  Resp:  18 18 17   Temp:  97.7 F (36.5 C) 97.8 F (36.6 C) 97.8 F (36.6 C)  TempSrc:  Oral    SpO2:  98% 100% 100%  Weight: 71.7 kg     Height: 5\' 6"  (1.676 m)       Intake/Output Summary (Last 24 hours) at 03/22/2020 1318 Last data filed at 03/22/2020 1131 Gross per 24 hour  Intake 164.54 ml  Output 1100 ml  Net -935.46 ml   Filed Weights   03/21/20 2050 03/22/20 0300  Weight: 76.7 kg 71.7 kg    Examination:  General exam: Appears calm and uncomfortable  Respiratory system: Clear to auscultation. Respiratory effort normal. No rales Cardiovascular system: S1 & S2 +. No JVD, murmurs, rubs, gallops or clicks. No pedal edema. Gastrointestinal system: Abdomen is nondistended, soft and nontender.  Normal bowel sounds heard. Central nervous system: Alert and oriented. Moves all 4  extremities Psychiatry: Judgement and insight appear normal. Flat mood and affect .     Data Reviewed: I have personally reviewed following labs and imaging studies  CBC: Recent Labs  Lab 03/21/20 2024  WBC 8.2  HGB 14.2  HCT 43.7  MCV 82.3  PLT 447*   Basic Metabolic Panel: Recent Labs  Lab 03/21/20 2024  NA 141  K 3.7  CL 102  CO2 26  GLUCOSE 84  BUN 11  CREATININE 0.74  CALCIUM 9.6   GFR: Estimated Creatinine Clearance: 93 mL/min (by C-G formula based on SCr of 0.74 mg/dL). Liver Function Tests: No results for input(s): AST, ALT, ALKPHOS, BILITOT, PROT, ALBUMIN in the last 168 hours. No results for input(s): LIPASE, AMYLASE in the last 168 hours. No results for input(s): AMMONIA in the last 168 hours. Coagulation Profile: Recent Labs  Lab 03/21/20 2103  INR 1.0   Cardiac Enzymes: No results for input(s): CKTOTAL, CKMB, CKMBINDEX, TROPONINI in the last 168 hours. BNP (last 3 results) No results for input(s): PROBNP in the last 8760 hours. HbA1C: No results for input(s): HGBA1C in the last 72 hours. CBG: Recent Labs  Lab 03/22/20 0025 03/22/20 0815 03/22/20 1130  GLUCAP 92 101* 79   Lipid Profile: Recent Labs    03/21/20 2024  CHOL 211*  HDL 50  LDLCALC 131*  TRIG 150*  CHOLHDL 4.2   Thyroid Function Tests:  No results for input(s): TSH, T4TOTAL, FREET4, T3FREE, THYROIDAB in the last 72 hours. Anemia Panel: No results for input(s): VITAMINB12, FOLATE, FERRITIN, TIBC, IRON, RETICCTPCT in the last 72 hours. Sepsis Labs: No results for input(s): PROCALCITON, LATICACIDVEN in the last 168 hours.  Recent Results (from the past 240 hour(s))  SARS Coronavirus 2 by RT PCR (hospital order, performed in [facility] hospital lab) Nasopharyngeal Nasopharyngeal Swab     Status: None   Collection Time: 03/21/20 11:19 PM   Specimen: Nasopharyngeal Swab  Result Value Ref Range Status   SARS Coronavirus 2 NEGATIVE NEGATIVE Final    Comment:  (NOTE) SARS-CoV-2 target nucleic acids are NOT DETECTED.  The SARS-CoV-2 RNA is generally detectable in upper and lower respiratory specimens during the acute phase of infection. The lowest concentration of SARS-CoV-2 viral copies this assay can detect is 250 copies / mL. A negative result does not preclude SARS-CoV-2 infection and should not be used as the sole basis for treatment or other patient management decisions.  A negative result may occur with improper specimen collection / handling, submission of specimen other than nasopharyngeal swab, presence of viral mutation(s) within the areas targeted by this assay, and inadequate number of viral copies (<250 copies / mL). A negative result must be combined with clinical observations, patient history, and epidemiological information.  Fact Sheet for Patients:   BoilerBrush.com.cy  Fact Sheet for Healthcare Providers: https://pope.com/  This test is not yet approved or  cleared by the [place] FDA and has been authorized for detection and/or diagnosis of SARS-CoV-2 by FDA under an Emergency Use Authorization (EUA).  This EUA will remain in effect (meaning this test can be used) for the duration of the COVID-19 declaration under Section 564(b)(1) of the Act, 21 U.S.C. section 360bbb-3(b)(1), unless the authorization is terminated or revoked sooner.  Performed at [facility], [address] [place], [place] [address]          Radiology Studies: [facility] Chest [place] 1 View  Result Date: 03/21/2020 CLINICAL DATA:  Epigastric pain, nausea. EXAM: PORTABLE CHEST 1 VIEW COMPARISON:  None. FINDINGS: The heart size and mediastinal contours are within normal limits. Both lungs are clear. The visualized skeletal structures are unremarkable. IMPRESSION: Normal study. Electronically Signed   By: Charlett Nose M.D.   On: 03/21/2020 21:00        Scheduled Meds: . aspirin EC  81  mg Oral Daily  . atorvastatin  20 mg Oral Daily  . ezetimibe  10 mg Oral Daily  . fenofibrate  160 mg Oral QHS  . insulin aspart  0-15 Units Subcutaneous TID WC  . insulin aspart  0-5 Units Subcutaneous QHS  . insulin aspart  10 Units Subcutaneous TID WC  . insulin glargine  30 Units Subcutaneous QHS  . irbesartan  150 mg Oral Daily  . meclizine  25 mg Oral TID  . pantoprazole (PROTONIX) IV  40 mg Intravenous QHS   Continuous Infusions: . heparin 900 Units/hr (03/21/20 2233)     LOS: 0 days    Time spent: 33 mins    Charise Killian, MD [facility] Pager [phone]  If 7PM-7AM, please contact night-coverage [email or web address] 03/22/2020, 1:18 PM

## 2020-03-22 NOTE — Progress Notes (Signed)
Report received on pt from Mac, RN. Awaiting pt arrival.

## 2020-03-22 NOTE — Progress Notes (Signed)
Pt stated feeling better and no pain anywhere at this time. Informed pt to remain NPO after midnight per MD order. Pt verbalizes understanding.

## 2020-03-22 NOTE — Progress Notes (Signed)
Pt arrived to unit and complained of having epigastric pain. Pt started projectile vomiting. Zofran 4 mg IVP obtained and given to pt immediately. Pt stated pain is only intermittent at times and is not radiating at this time. Will continue monitor pt closely. Pt placed on monitor and nothing abnormal noted at this time.

## 2020-03-23 DIAGNOSIS — R079 Chest pain, unspecified: Secondary | ICD-10-CM | POA: Diagnosis not present

## 2020-03-23 DIAGNOSIS — K219 Gastro-esophageal reflux disease without esophagitis: Secondary | ICD-10-CM | POA: Diagnosis not present

## 2020-03-23 DIAGNOSIS — R1013 Epigastric pain: Secondary | ICD-10-CM | POA: Diagnosis not present

## 2020-03-23 DIAGNOSIS — R9431 Abnormal electrocardiogram [ECG] [EKG]: Secondary | ICD-10-CM | POA: Diagnosis not present

## 2020-03-23 DIAGNOSIS — I10 Essential (primary) hypertension: Secondary | ICD-10-CM | POA: Diagnosis not present

## 2020-03-23 DIAGNOSIS — Z87891 Personal history of nicotine dependence: Secondary | ICD-10-CM | POA: Diagnosis not present

## 2020-03-23 DIAGNOSIS — Z794 Long term (current) use of insulin: Secondary | ICD-10-CM | POA: Diagnosis not present

## 2020-03-23 DIAGNOSIS — R42 Dizziness and giddiness: Secondary | ICD-10-CM | POA: Diagnosis not present

## 2020-03-23 DIAGNOSIS — Z20822 Contact with and (suspected) exposure to covid-19: Secondary | ICD-10-CM | POA: Diagnosis not present

## 2020-03-23 DIAGNOSIS — E1165 Type 2 diabetes mellitus with hyperglycemia: Secondary | ICD-10-CM | POA: Diagnosis not present

## 2020-03-23 DIAGNOSIS — R112 Nausea with vomiting, unspecified: Secondary | ICD-10-CM | POA: Diagnosis not present

## 2020-03-23 DIAGNOSIS — E11319 Type 2 diabetes mellitus with unspecified diabetic retinopathy without macular edema: Secondary | ICD-10-CM | POA: Diagnosis not present

## 2020-03-23 LAB — GLUCOSE, CAPILLARY: Glucose-Capillary: 149 mg/dL — ABNORMAL HIGH (ref 70–99)

## 2020-03-23 LAB — CBC
HCT: 41.2 % (ref 39.0–52.0)
Hemoglobin: 13.4 g/dL (ref 13.0–17.0)
MCH: 27 pg (ref 26.0–34.0)
MCHC: 32.5 g/dL (ref 30.0–36.0)
MCV: 82.9 fL (ref 80.0–100.0)
Platelets: 316 10*3/uL (ref 150–400)
RBC: 4.97 MIL/uL (ref 4.22–5.81)
RDW: 13.8 % (ref 11.5–15.5)
WBC: 5.1 10*3/uL (ref 4.0–10.5)
nRBC: 0 % (ref 0.0–0.2)

## 2020-03-23 LAB — HEPARIN LEVEL (UNFRACTIONATED): Heparin Unfractionated: 0.38 IU/mL (ref 0.30–0.70)

## 2020-03-23 MED ORDER — ONDANSETRON HCL 8 MG PO TABS
8.0000 mg | ORAL_TABLET | Freq: Three times a day (TID) | ORAL | 0 refills | Status: AC | PRN
Start: 2020-03-23 — End: 2020-04-06

## 2020-03-23 MED ORDER — PANTOPRAZOLE SODIUM 40 MG PO TBEC
40.0000 mg | DELAYED_RELEASE_TABLET | Freq: Every day | ORAL | 0 refills | Status: DC
Start: 2020-03-23 — End: 2020-09-16

## 2020-03-23 MED ORDER — MECLIZINE HCL 25 MG PO TABS: 25.0000 mg | ORAL_TABLET | Freq: Three times a day (TID) | ORAL | 0 refills | Status: AC | PRN

## 2020-03-23 NOTE — Progress Notes (Signed)
Randy Grant to be D/C'd Home per MD order.  Discussed prescriptions and follow up appointments with the patient. Prescriptions given to patient, medication list explained in detail. Pt verbalized understanding.  Allergies as of 03/23/2020   No Known Allergies     Medication List    TAKE these medications   aspirin 81 MG tablet Take 81 mg by mouth daily.   atorvastatin 20 MG tablet Commonly known as: LIPITOR Take 1 tablet (20 mg total) by mouth daily.   Basaglar KwikPen 100 UNIT/ML Inject 0.45 mLs (45 Units total) into the skin daily. What changed: when to take this   Bayer Microlet Lancets lancets USE AS DIRECTED   ezetimibe 10 MG tablet Commonly known as: ZETIA Take 1 tablet (10 mg total) by mouth daily.   fenofibrate 160 MG tablet Take 1 tablet (160 mg total) by mouth at bedtime.   glucose blood test strip Commonly known as: ONE TOUCH ULTRA TEST Use as instructed three times daily to check blood sugars   Accu-Chek Guide test strip Generic drug: glucose blood USE AS INSTRUCTED   HumaLOG KwikPen 100 UNIT/ML KwikPen Generic drug: insulin lispro INJECT 14 UNITS TOTAL INTO THE SKIN 3 TIMES DAILY.   hydrochlorothiazide 25 MG tablet Commonly known as: HYDRODIURIL Take 1 tablet (25 mg total) by mouth daily.   Insulin Pen Needle 31G X 8 MM Misc Commonly known as: B-D ULTRAFINE III SHORT PEN Test blood sugars 2 times daily   B-D ULTRAFINE III SHORT PEN 31G X 8 MM Misc Generic drug: Insulin Pen Needle TEST BLOOD SUGARS 2 TIMES DAILY   Unifine Pentips 31G X 8 MM Misc Generic drug: Insulin Pen Needle TEST BLOOD SUGARS 2 TIMES DAILY   meclizine 25 MG tablet Commonly known as: ANTIVERT Take 1 tablet (25 mg total) by mouth 3 (three) times daily as needed for dizziness.   metFORMIN 500 MG 24 hr tablet Commonly known as: GLUCOPHAGE-XR TAKE 2 TABLETS BY MOUTH 2 TIMES DAILY.   multivitamin tablet Take 1 tablet by mouth daily.   ondansetron 8 MG  tablet Commonly known as: Zofran Take 1 tablet (8 mg total) by mouth every 8 (eight) hours as needed for up to 14 days for nausea or vomiting.   pantoprazole 40 MG tablet Commonly known as: Protonix Take 1 tablet (40 mg total) by mouth daily.   telmisartan 40 MG tablet Commonly known as: MICARDIS TAKE 1 TABLET (40 MG TOTAL) BY MOUTH DAILY.       Vitals:   03/23/20 0403 03/23/20 0759  BP: 105/63 (!) 107/51  Pulse: 66 60  Resp: 16   Temp: 98.2 F (36.8 C) 98 F (36.7 C)  SpO2: 97% 97%    Skin clean, dry and intact without evidence of skin break down, no evidence of skin tears noted. IV catheter discontinued intact. Site without signs and symptoms of complications. Dressing and pressure applied. Pt denies pain at this time. No complaints noted.  An After Visit Summary was printed and given to the patient. Patient to be discharged home waiting on ride   [address]

## 2020-03-23 NOTE — Progress Notes (Signed)
ANTICOAGULATION CONSULT NOTE   Pharmacy Consult for Heparin  Indication: chest pain/ACS  No Known Allergies  Patient Measurements: Height: 5\' 6"  (167.6 cm) Weight: 71.5 kg (157 lb 9.6 oz) IBW/kg (Calculated) : 63.8 Heparin Dosing Weight:  71.7 kg   Vital Signs: Temp: 98 F (36.7 C) (08/14 0759) Temp Source: Oral (08/14 0759) BP: 107/51 (08/14 0759) Pulse Rate: 60 (08/14 0759)  Labs: Recent Labs    03/21/20 2024 03/21/20 2024 03/21/20 2103 03/21/20 2319 03/22/20 0519 03/22/20 0820 03/22/20 1058 03/23/20 0614  HGB 14.2  --   --   --   --   --   --  13.4  HCT 43.7  --   --   --   --   --   --  41.2  PLT 447*  --   --   --   --   --   --  316  APTT  --   --  25  --   --   --   --   --   LABPROT  --   --  12.9  --   --   --   --   --   INR  --   --  1.0  --   --   --   --   --   HEPARINUNFRC  --   --   --   --  0.53  --  0.61 0.38  CREATININE 0.74  --   --   --   --   --   --   --   TROPONINIHS <2   < >  --  5  --  3 2  --    < > = values in this interval not displayed.    Estimated Creatinine Clearance: 93 mL/min (by C-G formula based on SCr of 0.74 mg/dL).   Medical History: Past Medical History:  Diagnosis Date  . Cataract 2017   left  . Diabetes mellitus   . GERD (gastroesophageal reflux disease)   . Hyperlipidemia   . Hypertension     Medications:  Medications Prior to Admission  Medication Sig Dispense Refill Last Dose  . aspirin 81 MG tablet Take 81 mg by mouth daily.     03/21/2020 at Unknown time  . atorvastatin (LIPITOR) 20 MG tablet Take 1 tablet (20 mg total) by mouth daily. 90 tablet 3 03/21/2020 at 1500  . ezetimibe (ZETIA) 10 MG tablet Take 1 tablet (10 mg total) by mouth daily. 90 tablet 1 03/21/2020 at 1500  . fenofibrate 160 MG tablet Take 1 tablet (160 mg total) by mouth at bedtime. 90 tablet 1 03/20/2020 at Unknown time  . HUMALOG KWIKPEN 100 UNIT/ML KwikPen INJECT 14 UNITS TOTAL INTO THE SKIN 3 TIMES DAILY. 30 mL 2 03/21/2020 at 1500  .  Insulin Glargine (BASAGLAR KWIKPEN) 100 UNIT/ML Inject 0.45 mLs (45 Units total) into the skin daily. (Patient taking differently: Inject 45 Units into the skin at bedtime. ) 15 pen 1 03/20/2020 at Unknown time  . metFORMIN (GLUCOPHAGE-XR) 500 MG 24 hr tablet TAKE 2 TABLETS BY MOUTH 2 TIMES DAILY. 360 tablet 1 03/21/2020 at 1500  . Multiple Vitamin (MULTIVITAMIN) tablet Take 1 tablet by mouth daily.     03/21/2020 at 1500  . telmisartan (MICARDIS) 40 MG tablet TAKE 1 TABLET (40 MG TOTAL) BY MOUTH DAILY. 90 tablet 0 03/20/2020 at Unknown time  . ACCU-CHEK GUIDE test strip USE AS INSTRUCTED 100 strip 5   .  B-D ULTRAFINE III SHORT PEN 31G X 8 MM MISC TEST BLOOD SUGARS 2 TIMES DAILY 100 each 6   . BAYER MICROLET LANCETS lancets USE AS DIRECTED 100 each 5   . glucose blood (ONE TOUCH ULTRA TEST) test strip Use as instructed three times daily to check blood sugars 100 each 12   . hydrochlorothiazide (HYDRODIURIL) 25 MG tablet Take 1 tablet (25 mg total) by mouth daily. (Patient not taking: Reported on 03/21/2020) 60 tablet 3 Not Taking at Unknown time  . Insulin Pen Needle (B-D ULTRAFINE III SHORT PEN) 31G X 8 MM MISC Test blood sugars 2 times daily 100 each 5   . UNIFINE PENTIPS 31G X 8 MM MISC TEST BLOOD SUGARS 2 TIMES DAILY 100 each 5     Assessment: Pharmacy consulted to dose heparin in this 54 year old male admitted with ACS/NSTEMI.  No prior anticoag noted. CrCl = 100.6 ml/min   8/13 0519 HL 0.53 8/13 1058 HL 0.61 8/14 0614 HL 0.38  Goal of Therapy:  Heparin level 0.3-0.7 units/ml Monitor platelets by anticoagulation protocol: Yes   Plan:  Heparin level is therapeutic. Will continue current rate (900 units/hr) and will recheck HL and CBC with AM labs.  Reatha Armour, PharmD Pharmacy Resident  03/23/2020 8:30 AM

## 2020-03-23 NOTE — Discharge Summary (Signed)
Physician Discharge Summary  Randy Grant:[id number] DOB: September 24, 1965 DOA: 03/21/2020  PCP: Sherlene Shams, MD  Admit date: 03/21/2020 Discharge date: 03/23/2020  Admitted From: home Disposition:  home  Recommendations for Outpatient Follow-up:  1. Follow up with PCP in 1 week  Home Health: no Equipment/Devices:  Discharge Condition: stable CODE STATUS: full  Diet recommendation: Heart Healthy / Carb Modified   Brief/Interim Summary: HPI was taken from Dr. Cyndia Bent: Randy Grant is a 54 y.o. male with medical history significant for hypertension, insulin-dependent type 2 diabetes with retinopathy, and hyperlipidemia who presents with concerns of epigastric pain, nausea and vomiting.  Patient has been having heartburn symptoms for the past 2 days and has been taking over-the-counter medication.  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  Also felt dizzy and unsteady and had to rest.  He denies any associated shortness of breath.  He has had issues with heartburn in the past but never this severe. Patient reports that he has had complaints of heart palpitation and had planned stress test a few weeks ago but he missed his appointment. Denies any history of surgeries.  Denies tobacco, alcohol or illicit drug use.  States last bowel movement was yesterday. No personal family history of cardiac issues.  ED Course: He was afebrile normotensive on room air.  Initial troponin of less than 2.  Initial EKG showed right bundle branch block and left anterior fascicular block similar to prior in July.  However a repeat second EKG shows new flipped T waves in V2 and V3. Patient received GI cocktail as well as nitroglycerin patch in the ED and feels his symptom is improved. Lab work otherwise unremarkable.  Chest x-ray negative  Hospital Course from Dr. Wilfred Lacy 8/13-8/14/21:  Pt presented w/ chest pain, nausea & vomiting. Pt had a cardiac stress done which showed no ischemia & normal EF. Furthermore, pt's vomiting resolved inpatient w/ zofran & PPI. Pt likely has GERD and was started on pantoprazole. Pt has diet high in spicy foods. It was recommended that pt cut out a spicy foods, tomato based products & peppermints. Furthermore, pt c/o dizziness of unknown etiology and was started on meclizine for possible BPPV. Pt's dizziness improved but was not resolved at the time of d/c. Pt will f/u w/ his PCP if dizziness worsens or does not improve.    Discharge Diagnoses:  Active Problems:   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)   Mixed hyperlipidemia   Essential hypertension   Chest pain   Chest pain: troponins are not elevated. EKG changes shows flipped T waves. Continue on IV heparin. Continue on tele. Echo pending. Stress test ordered as per cardio.   Dizziness: etiology unclear, possibly BPPV. Started on meclizine   DM2: HbA1c 9.3, poorly controlled. Continue on lantus, novolog & SSI w/ accuchecks  Likely GERD: continue on PPI   HTN: continue on irbesartan   HLD: continue on statin   Thrombocytosis: etiology unclear. Will continue to monitor   Discharge Instructions  Discharge Instructions    Diet - low sodium heart healthy   Complete by: As directed    Diet Carb Modified   Complete by: As directed    Discharge instructions   Complete by: As directed    F/u PCP in 1 week. F/u cardio in 1-2 months   Increase activity slowly   Complete by:  As directed      Allergies as of 03/23/2020   No Known Allergies     Medication List    TAKE these medications   aspirin 81 MG tablet Take 81 mg by mouth daily.   atorvastatin 20 MG tablet Commonly known as: LIPITOR Take 1 tablet (20 mg total) by mouth daily.   Basaglar KwikPen 100 UNIT/ML Inject 0.45 mLs (45 Units total) into the skin daily. What changed: when to take this   Bayer  Microlet Lancets lancets USE AS DIRECTED   ezetimibe 10 MG tablet Commonly known as: ZETIA Take 1 tablet (10 mg total) by mouth daily.   fenofibrate 160 MG tablet Take 1 tablet (160 mg total) by mouth at bedtime.   glucose blood test strip Commonly known as: ONE TOUCH ULTRA TEST Use as instructed three times daily to check blood sugars   Accu-Chek Guide test strip Generic drug: glucose blood USE AS INSTRUCTED   HumaLOG KwikPen 100 UNIT/ML KwikPen Generic drug: insulin lispro INJECT 14 UNITS TOTAL INTO THE SKIN 3 TIMES DAILY.   hydrochlorothiazide 25 MG tablet Commonly known as: HYDRODIURIL Take 1 tablet (25 mg total) by mouth daily.   Insulin Pen Needle 31G X 8 MM Misc Commonly known as: B-D ULTRAFINE III SHORT PEN Test blood sugars 2 times daily   B-D ULTRAFINE III SHORT PEN 31G X 8 MM Misc Generic drug: Insulin Pen Needle TEST BLOOD SUGARS 2 TIMES DAILY   Unifine Pentips 31G X 8 MM Misc Generic drug: Insulin Pen Needle TEST BLOOD SUGARS 2 TIMES DAILY   meclizine 25 MG tablet Commonly known as: ANTIVERT Take 1 tablet (25 mg total) by mouth 3 (three) times daily as needed for dizziness.   metFORMIN 500 MG 24 hr tablet Commonly known as: GLUCOPHAGE-XR TAKE 2 TABLETS BY MOUTH 2 TIMES DAILY.   multivitamin tablet Take 1 tablet by mouth daily.   ondansetron 8 MG tablet Commonly known as: Zofran Take 1 tablet (8 mg total) by mouth every 8 (eight) hours as needed for up to 14 days for nausea or vomiting.   pantoprazole 40 MG tablet Commonly known as: Protonix Take 1 tablet (40 mg total) by mouth daily.   telmisartan 40 MG tablet Commonly known as: MICARDIS TAKE 1 TABLET (40 MG TOTAL) BY MOUTH DAILY.       No Known Allergies  Consultations:     Procedures/Studies: NM Myocar Multi W/Spect W/Wall Motion / EF  Result Date: 03/22/2020 Pharmacological myocardial perfusion imaging study with no significant  ischemia Normal wall motion, EF estimated at 65%  No EKG changes concerning for ischemia at peak stress or in recovery. Low risk scan Signed, Randy Arbour, MD, Ph.D [facility]   DG Chest [place] 1 View  Result Date: 03/21/2020 CLINICAL DATA:  Epigastric pain, nausea. EXAM: PORTABLE CHEST 1 VIEW COMPARISON:  None. FINDINGS: The heart size and mediastinal contours are within normal limits. Both lungs are clear. The visualized skeletal structures are unremarkable. IMPRESSION: Normal study. Electronically Signed   By: Charlett Nose M.D.   On: 03/21/2020 21:00   ECHOCARDIOGRAM COMPLETE  Result Date: 03/22/2020    ECHOCARDIOGRAM REPORT   Patient Name:   AIMAR BORGHI Date of Exam: 03/22/2020 Medical Rec #:  [id number]          Height:       66.0 in Accession #:    [id number]         Weight:       158.1 lb Date  of Birth:  27-Jan-1966           BSA:          1.810 m Patient Age:    54 years           BP:           117/78 mmHg Patient Gender: M                  HR:           58 bpm. Exam Location:  [facility] Procedure: 2D Echo, Color Doppler and Cardiac Doppler Indications:     R07.9 Chest Pain  History:         Patient has no prior history of Echocardiogram examinations.                  Risk Factors:Hypertension, Diabetes and Dyslipidemia.  Sonographer:     Humphrey Rolls RDCS (AE) Referring Phys:  [id number] Antonieta Iba Diagnosing Phys: Julien Nordmann MD  Sonographer Comments: Suboptimal subcostal window. IMPRESSIONS  1. Left ventricular ejection fraction, by estimation, is 60 to 65%. The left ventricle has normal function. The left ventricle has no regional wall motion abnormalities. Left ventricular diastolic parameters are consistent with Grade I diastolic dysfunction (impaired relaxation).  2. Right ventricular systolic function is normal. The right ventricular size is normal. Tricuspid regurgitation signal is inadequate for assessing PA pressure. FINDINGS  Left Ventricle: Left ventricular ejection fraction, by estimation, is 60 to 65%. The left ventricle has normal  function. The left ventricle has no regional wall motion abnormalities. The left ventricular internal cavity size was normal in size. There is  no left ventricular hypertrophy. Left ventricular diastolic parameters are consistent with Grade I diastolic dysfunction (impaired relaxation). Right Ventricle: The right ventricular size is normal. No increase in right ventricular wall thickness. Right ventricular systolic function is normal. Tricuspid regurgitation signal is inadequate for assessing PA pressure. Left Atrium: Left atrial size was normal in size. Right Atrium: Right atrial size was normal in size. Pericardium: There is no evidence of pericardial effusion. Mitral Valve: The mitral valve is normal in structure. Normal mobility of the mitral valve leaflets. Mild mitral valve regurgitation. No evidence of mitral valve stenosis. MV peak gradient, 3.4 mmHg. The mean mitral valve gradient is 1.0 mmHg. Tricuspid Valve: The tricuspid valve is normal in structure. Tricuspid valve regurgitation is not demonstrated. No evidence of tricuspid stenosis. Aortic Valve: The aortic valve is normal in structure. Aortic valve regurgitation is not visualized. No aortic stenosis is present. Aortic valve mean gradient measures 3.0 mmHg. Aortic valve peak gradient measures 5.6 mmHg. Aortic valve area, by VTI measures 2.94 cm. Pulmonic Valve: The pulmonic valve was normal in structure. Pulmonic valve regurgitation is not visualized. No evidence of pulmonic stenosis. Aorta: The aortic root is normal in size and structure. Venous: The inferior vena cava is normal in size with greater than 50% respiratory variability, suggesting right atrial pressure of 3 mmHg. IAS/Shunts: No atrial level shunt detected by color flow Doppler.  LEFT VENTRICLE PLAX 2D LVIDd:         4.18 cm  Diastology LVIDs:         2.43 cm  LV e' lateral:   11.20 cm/s LV PW:         1.09 cm  LV E/e' lateral: 8.0 LV IVS:        0.75 cm  LV e' medial:    7.94 cm/s LVOT  diam:  2.00 cm  LV E/e' medial:  11.3 LV SV:         63 LV SV Index:   35 LVOT Area:     3.14 cm  RIGHT VENTRICLE RV Basal diam:  2.38 cm LEFT ATRIUM             Index       RIGHT ATRIUM           Index LA diam:        3.90 cm 2.16 cm/m  RA Area:     11.40 cm LA Vol (A2C):   38.7 ml 21.39 ml/m RA Volume:   22.70 ml  12.54 ml/m LA Vol (A4C):   18.3 ml 10.11 ml/m LA Biplane Vol: 28.9 ml 15.97 ml/m  AORTIC VALVE                   PULMONIC VALVE AV Area (Vmax):    2.57 cm    PV Vmax:       1.13 m/s AV Area (Vmean):   2.79 cm    PV Vmean:      73.800 cm/s AV Area (VTI):     2.94 cm    PV VTI:        0.215 m AV Vmax:           118.00 cm/s PV Peak grad:  5.1 mmHg AV Vmean:          73.100 cm/s PV Mean grad:  2.0 mmHg AV VTI:            0.214 m AV Peak Grad:      5.6 mmHg AV Mean Grad:      3.0 mmHg LVOT Vmax:         96.60 cm/s LVOT Vmean:        65.000 cm/s LVOT VTI:          0.200 m LVOT/AV VTI ratio: 0.93  AORTA Ao Root diam: 2.70 cm MITRAL VALVE MV Area (PHT): 4.04 cm    SHUNTS MV Peak grad:  3.4 mmHg    Systemic VTI:  0.20 m MV Mean grad:  1.0 mmHg    Systemic Diam: 2.00 cm MV Vmax:       0.92 m/s MV Vmean:      50.9 cm/s MV Decel Time: 188 msec MV E velocity: 90.00 cm/s MV A velocity: 82.70 cm/s MV E/A ratio:  1.09 Julien Nordmann MD Electronically signed by Julien Nordmann MD Signature Date/Time: 03/22/2020/1:29:51 PM    Final       Subjective: Pt c/o dizziness but improved from day prior.    Discharge Exam: Vitals:   03/23/20 0403 03/23/20 0759  BP: 105/63 (!) 107/51  Pulse: 66 60  Resp: 16   Temp: 98.2 F (36.8 C) 98 F (36.7 C)  SpO2: 97% 97%   Vitals:   03/22/20 1638 03/22/20 1950 03/23/20 0403 03/23/20 0759  BP: 117/64 108/73 105/63 (!) 107/51  Pulse: 67 66 66 60  Resp: 18 20 16    Temp: 97.9 F (36.6 C) 98.2 F (36.8 C) 98.2 F (36.8 C) 98 F (36.7 C)  TempSrc:  Oral Oral Oral  SpO2: 99% 98% 97% 97%  Weight:   71.5 kg   Height:        General: Pt is alert, awake,  not in acute distress Cardiovascular:  S1/S2 +, no rubs, no gallops Respiratory: CTA bilaterally, no wheezing, no rhonchi Abdominal: Soft, NT, ND, bowel sounds + Extremities: no edema,  no cyanosis    The results of significant diagnostics from this hospitalization (including imaging, microbiology, ancillary and laboratory) are listed below for reference.     Microbiology: Recent Results (from the past 240 hour(s))  SARS Coronavirus 2 by RT PCR (hospital order, performed in [facility] hospital lab) Nasopharyngeal Nasopharyngeal Swab     Status: None   Collection Time: 03/21/20 11:19 PM   Specimen: Nasopharyngeal Swab  Result Value Ref Range Status   SARS Coronavirus 2 NEGATIVE NEGATIVE Final    Comment: (NOTE) SARS-CoV-2 target nucleic acids are NOT DETECTED.  The SARS-CoV-2 RNA is generally detectable in upper and lower respiratory specimens during the acute phase of infection. The lowest concentration of SARS-CoV-2 viral copies this assay can detect is 250 copies / mL. A negative result does not preclude SARS-CoV-2 infection and should not be used as the sole basis for treatment or other patient management decisions.  A negative result may occur with improper specimen collection / handling, submission of specimen other than nasopharyngeal swab, presence of viral mutation(s) within the areas targeted by this assay, and inadequate number of viral copies (<250 copies / mL). A negative result must be combined with clinical observations, patient history, and epidemiological information.  Fact Sheet for Patients:   BoilerBrush.com.cy  Fact Sheet for Healthcare Providers: https://pope.com/  This test is not yet approved or  cleared by the [place] FDA and has been authorized for detection and/or diagnosis of SARS-CoV-2 by FDA under an Emergency Use Authorization (EUA).  This EUA will remain in effect (meaning this test can be  used) for the duration of the COVID-19 declaration under Section 564(b)(1) of the Act, 21 U.S.C. section 360bbb-3(b)(1), unless the authorization is terminated or revoked sooner.  Performed at [facility], [address] [place], [place] [address]      Labs: BNP (last 3 results) No results for input(s): BNP in the last 8760 hours. Basic Metabolic Panel: Recent Labs  Lab 03/21/20 2024  NA 141  K 3.7  CL 102  CO2 26  GLUCOSE 84  BUN 11  CREATININE 0.74  CALCIUM 9.6   Liver Function Tests: No results for input(s): AST, ALT, ALKPHOS, BILITOT, PROT, ALBUMIN in the last 168 hours. No results for input(s): LIPASE, AMYLASE in the last 168 hours. No results for input(s): AMMONIA in the last 168 hours. CBC: Recent Labs  Lab 03/21/20 2024 03/23/20 0614  WBC 8.2 5.1  HGB 14.2 13.4  HCT 43.7 41.2  MCV 82.3 82.9  PLT 447* 316   Cardiac Enzymes: No results for input(s): CKTOTAL, CKMB, CKMBINDEX, TROPONINI in the last 168 hours. BNP: Invalid input(s): POCBNP CBG: Recent Labs  Lab 03/22/20 0815 03/22/20 1130 03/22/20 1637 03/22/20 2129 03/23/20 0800  GLUCAP 101* 79 227* 150* 149*   D-Dimer No results for input(s): DDIMER in the last 72 hours. Hgb A1c No results for input(s): HGBA1C in the last 72 hours. Lipid Profile Recent Labs    03/21/20 2024  CHOL 211*  HDL 50  LDLCALC 131*  TRIG 150*  CHOLHDL 4.2   Thyroid function studies No results for input(s): TSH, T4TOTAL, T3FREE, THYROIDAB in the last 72 hours.  Invalid input(s): FREET3 Anemia work up No results for input(s): VITAMINB12, FOLATE, FERRITIN, TIBC, IRON, RETICCTPCT in the last 72 hours. Urinalysis    Component Value Date/Time   COLORURINE LT. YELLOW 12/08/2012 0912   APPEARANCEUR CLEAR 12/08/2012 0912   LABSPEC 1.025 12/08/2012 0912   PHURINE 6.0 12/08/2012 0912  GLUCOSEU NEGATIVE 12/08/2012 0912   HGBUR NEGATIVE 12/08/2012 0912   BILIRUBINUR NEGATIVE 12/08/2012 0912   KETONESUR  NEGATIVE 12/08/2012 0912   UROBILINOGEN 0.2 12/08/2012 0912   NITRITE NEGATIVE 12/08/2012 0912   LEUKOCYTESUR NEGATIVE 12/08/2012 0912   Sepsis Labs Invalid input(s): PROCALCITONIN,  WBC,  LACTICIDVEN Microbiology Recent Results (from the past 240 hour(s))  SARS Coronavirus 2 by RT PCR (hospital order, performed in [facility] hospital lab) Nasopharyngeal Nasopharyngeal Swab     Status: None   Collection Time: 03/21/20 11:19 PM   Specimen: Nasopharyngeal Swab  Result Value Ref Range Status   SARS Coronavirus 2 NEGATIVE NEGATIVE Final    Comment: (NOTE) SARS-CoV-2 target nucleic acids are NOT DETECTED.  The SARS-CoV-2 RNA is generally detectable in upper and lower respiratory specimens during the acute phase of infection. The lowest concentration of SARS-CoV-2 viral copies this assay can detect is 250 copies / mL. A negative result does not preclude SARS-CoV-2 infection and should not be used as the sole basis for treatment or other patient management decisions.  A negative result may occur with improper specimen collection / handling, submission of specimen other than nasopharyngeal swab, presence of viral mutation(s) within the areas targeted by this assay, and inadequate number of viral copies (<250 copies / mL). A negative result must be combined with clinical observations, patient history, and epidemiological information.  Fact Sheet for Patients:   BoilerBrush.com.cyhttps://www.fda.gov/media/136312/download  Fact Sheet for Healthcare Providers: https://pope.com/https://www.fda.gov/media/136313/download  This test is not yet approved or  cleared by the [place] FDA and has been authorized for detection and/or diagnosis of SARS-CoV-2 by FDA under an Emergency Use Authorization (EUA).  This EUA will remain in effect (meaning this test can be used) for the duration of the COVID-19 declaration under Section 564(b)(1) of the Act, 21 U.S.C. section 360bbb-3(b)(1), unless the authorization is terminated  or revoked sooner.  Performed at [facility], [address] [place], [place] [address]      Time coordinating discharge: Over 30 minutes  SIGNED:   Charise KillianJamiese M Jonesha Tsuchiya, MD  [facility] 03/23/2020, 12:22 PM Pager   If 7PM-7AM, please contact night-coverage [email or web address]

## 2020-03-23 NOTE — Progress Notes (Signed)
Patient still c/o vertigo with ambulation. Dr. Mayford Knife was made aware. md ordered to give scheduled dose of antivert now and patient is still okay to be discharged home. Will continue to monitor

## 2020-03-29 ENCOUNTER — Ambulatory Visit: Payer: 59 | Admitting: Pharmacist

## 2020-03-29 ENCOUNTER — Encounter: Payer: Self-pay | Admitting: Internal Medicine

## 2020-03-29 ENCOUNTER — Ambulatory Visit: Payer: 59 | Admitting: Internal Medicine

## 2020-03-29 ENCOUNTER — Other Ambulatory Visit: Payer: Self-pay | Admitting: Internal Medicine

## 2020-03-29 ENCOUNTER — Other Ambulatory Visit: Payer: Self-pay

## 2020-03-29 VITALS — BP 150/82 | HR 96 | Temp 98.7°F | Resp 15 | Ht 66.0 in | Wt 164.4 lb

## 2020-03-29 DIAGNOSIS — IMO0002 Reserved for concepts with insufficient information to code with codable children: Secondary | ICD-10-CM

## 2020-03-29 DIAGNOSIS — H6122 Impacted cerumen, left ear: Secondary | ICD-10-CM

## 2020-03-29 DIAGNOSIS — K2900 Acute gastritis without bleeding: Secondary | ICD-10-CM | POA: Diagnosis not present

## 2020-03-29 DIAGNOSIS — R9431 Abnormal electrocardiogram [ECG] [EKG]: Secondary | ICD-10-CM

## 2020-03-29 DIAGNOSIS — R079 Chest pain, unspecified: Secondary | ICD-10-CM

## 2020-03-29 DIAGNOSIS — E782 Mixed hyperlipidemia: Secondary | ICD-10-CM | POA: Diagnosis not present

## 2020-03-29 DIAGNOSIS — E11319 Type 2 diabetes mellitus with unspecified diabetic retinopathy without macular edema: Secondary | ICD-10-CM

## 2020-03-29 DIAGNOSIS — Z09 Encounter for follow-up examination after completed treatment for conditions other than malignant neoplasm: Secondary | ICD-10-CM | POA: Diagnosis not present

## 2020-03-29 DIAGNOSIS — E1165 Type 2 diabetes mellitus with hyperglycemia: Secondary | ICD-10-CM

## 2020-03-29 DIAGNOSIS — H9312 Tinnitus, left ear: Secondary | ICD-10-CM | POA: Diagnosis not present

## 2020-03-29 DIAGNOSIS — R42 Dizziness and giddiness: Secondary | ICD-10-CM | POA: Diagnosis not present

## 2020-03-29 MED ORDER — DEXCOM G6 TRANSMITTER MISC: 1 refills | Status: DC

## 2020-03-29 MED ORDER — BASAGLAR KWIKPEN 100 UNIT/ML ~~LOC~~ SOPN: 35.0000 [IU] | PEN_INJECTOR | Freq: Every day | SUBCUTANEOUS | 1 refills | Status: DC

## 2020-03-29 MED ORDER — INSULIN LISPRO (1 UNIT DIAL) 100 UNIT/ML (KWIKPEN): 10.0000 [IU] | PEN_INJECTOR | Freq: Three times a day (TID) | SUBCUTANEOUS | 2 refills | Status: DC

## 2020-03-29 MED ORDER — DEXCOM G6 SENSOR MISC: 1 refills | Status: DC

## 2020-03-29 MED ORDER — ATORVASTATIN CALCIUM 40 MG PO TABS
40.0000 mg | ORAL_TABLET | Freq: Every day | ORAL | 1 refills | Status: DC
Start: 2020-03-29 — End: 2020-09-17

## 2020-03-29 MED ORDER — OZEMPIC (0.25 OR 0.5 MG/DOSE) 2 MG/1.5ML ~~LOC~~ SOPN: 0.5000 mg | PEN_INJECTOR | SUBCUTANEOUS | 2 refills | Status: DC

## 2020-03-29 NOTE — Assessment & Plan Note (Signed)
LDL not at goal on 20 mg atorvastaitn..  Increasing to 40 mg today  Lab Results  Component Value Date   CHOL 211 (H) 03/21/2020   HDL 50 03/21/2020   LDLCALC 131 (H) 03/21/2020   LDLDIRECT 130.0 01/01/2020   TRIG 150 (H) 03/21/2020   CHOLHDL 4.2 03/21/2020

## 2020-03-29 NOTE — Patient Instructions (Addendum)
It was great to see you today!   We are going to start Ozempic. Inject 0.25 mg once weekly for 4 weeks, then increase to 0.5 mg weekly.   To prevent low blood sugars, when you start Ozempic, decrease the Tresiba to 35 units every evening. Decrease Humalog to 10 units three times daily with meals.   I will still plan to call you next week to follow up on the DexCom continuous glucose meter and what the cost is going to be.    For your cholesterol, we are going to increase atorvastatin to 40 mg daily. We want your bad cholesterol (LDL) to be <70.   Please call me with any questions or concerns!  Catie Darnelle Maffucci, PharmD [phone] Visit Information  Goals Addressed              This Visit's Progress     Patient Stated   .  PharmD "I want to be healthy" (pt-stated)        CARE PLAN ENTRY (see longitudinal plan of care for additional care plan information)  Current Barriers:  . Social, financial, community barriers:  o Denies financial concerns w/ medications. 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 S/p hospitalization, cardiology f/u next week . Diabetes: uncontrolled; complicated by chronic medical conditions including HTN, HLD, most recent A1c 9.3% . Most recent eGFR: >100 mL/min . Current antihyperglycemic regimen:  metformin XR 1000 mg BID, Basaglar 45, Humalog 14 units TID with meals o Hx SGLT2, previously stopped d/t complaints of too frequent polyuria . Does note occasional sensations of hypoglycemia, but is unable to stop at work and check sugars. Asks today about ways to reduce insulin burden . Current meal patterns: frequent higher carb dietary choices, mixes w/ period of religious fasting . Current blood glucose readings:  o Before: 80-150 o After: 200-225 . Cardiovascular risk reduction: cardiology f/u next week o Current hypertensive regimen: telmisartan 40 mg daily o Current hyperlipidemia regimen: atorvastatin 20 mg daily, ezetimibe 10 mg daily, fenofibrate 160 mg  daily; last LDL in hospital 131, and patient confirms adherence. TG borderline at 150 o Current antiplatelet regimen: ASA 81 mg daily . Eye exam: up to date . Nephropathy screening: up to date   Pharmacist Clinical Goal(s):  Marland Kitchen Over the next 90 days, patient will work with PharmD and primary care provider to address optimized medication management  Interventions: . Comprehensive medication review performed, medication list updated in electronic medical record . Inter-disciplinary care team collaboration (see longitudinal plan of care) . Discussed addition of SGLT2 vs GLP1 to reduce insulin burden and improve glycemic control. Given inability to tolerate SGLT2 previously, will start GLP1. Discussed Ozempic and counseled on technique. Start Ozempic 0.25 mg x 4 weeks then increase to 0.5 mg weekly. Decrease Tresiba to 35 units daily and Humalog to 10 units TID with meals to reduce risk of hypoglycemia w/ Ozempic initiation. Patient verbalized understanding.  . Discussed pursuing coverage of DexCom CGM through insurance to allow for improved glycemic review. Patient amenable. Completed PA for DexCom G6 sensor and transmitter today.  . Discussed uncontrolled LDL on current regimen of atorvastatin 20 mg daily and ezetimibe 10 mg daily. Patient denies having tried higher doses of atorvastatin before. Increase to 40 mg daily. Patient verbalizes understanding.  Patient Self Care Activities:  . Patient will check blood glucose at least QID, document, and provide at future appointments . Patient will take medications as prescribed . Patient will report any questions or concerns to provider  Initial goal documentation        Mr. Glaze was given information about Chronic Care Management services today including:  1. CCM service includes personalized support from designated clinical staff supervised by his physician, including individualized plan of care and coordination with other care  providers 2. 24/7 contact phone numbers for assistance for urgent and routine care needs. 3. Service will only be billed when office clinical staff spend 20 minutes or more in a month to coordinate care. 4. Only one practitioner may furnish and bill the service in a calendar month. 5. The patient may stop CCM services at any time (effective at the end of the month) by phone call to the office staff. 6. The patient will be responsible for cost sharing (co-pay) of up to 20% of the service fee (after annual deductible is met).  Patient agreed to services and verbal consent obtained.   Print copy of patient instructions provided.   Plan: - Will keep f/u appt scheduled next week to f/u on CGM PA and outcome of cardiology appt  Catie Darnelle Maffucci, PharmD, [place], [place] Pharmacist [place] [phone]

## 2020-03-29 NOTE — Chronic Care Management (AMB) (Deleted)
Chronic Care Management   Follow Up Note   03/29/2020 Name: Randy Grant MRN: [id number] DOB: January 23, 1966  Referred by: Crecencio Mc, MD Reason for referral : Chronic Care Management (Medication Management)   Randy Grant is a 54 y.o. year old male who is a primary care patient of Tullo, Aris Everts, MD. The CCM team was consulted for assistance with chronic disease management and care coordination needs.    Met with patient face to face for medication management review.   Review of patient status, including review of consultants reports, relevant laboratory and other test results, and collaboration with appropriate care team members and the patient's provider was performed as part of comprehensive patient evaluation and provision of chronic care management services.    SDOH (Social Determinants of Health) assessments performed: Yes See Care Plan activities for detailed interventions related to SDOH)  SDOH Interventions     Most Recent Value  SDOH Interventions  Financial Strain Interventions Intervention Not Indicated       Outpatient Encounter Medications as of 03/29/2020  Medication Sig  . ezetimibe (ZETIA) 10 MG tablet Take 1 tablet (10 mg total) by mouth daily.  . fenofibrate 160 MG tablet Take 1 tablet (160 mg total) by mouth at bedtime.  . insulin lispro (HUMALOG KWIKPEN) 100 UNIT/ML KwikPen Inject 0.1 mLs (10 Units total) into the skin 3 (three) times daily.  . metFORMIN (GLUCOPHAGE-XR) 500 MG 24 hr tablet TAKE 2 TABLETS BY MOUTH 2 TIMES DAILY.  . [DISCONTINUED] atorvastatin (LIPITOR) 20 MG tablet Take 1 tablet (20 mg total) by mouth daily.  . [DISCONTINUED] HUMALOG KWIKPEN 100 UNIT/ML KwikPen INJECT 14 UNITS TOTAL INTO THE SKIN 3 TIMES DAILY. (Patient taking differently: 18 Units. )  . ACCU-CHEK GUIDE test strip USE AS INSTRUCTED  . aspirin 81 MG tablet Take 81 mg by mouth daily.    . B-D ULTRAFINE III SHORT PEN 31G X 8 MM MISC TEST BLOOD SUGARS 2 TIMES DAILY  .  BAYER MICROLET LANCETS lancets USE AS DIRECTED  . Continuous Blood Gluc Sensor (DEXCOM G6 SENSOR) MISC Use to check blood sugar at least 4 times daily  . Continuous Blood Gluc Transmit (DEXCOM G6 TRANSMITTER) MISC Use to check sugar at least 4 times daily  . glucose blood (ONE TOUCH ULTRA TEST) test strip Use as instructed three times daily to check blood sugars  . hydrochlorothiazide (HYDRODIURIL) 25 MG tablet Take 1 tablet (25 mg total) by mouth daily.  . Insulin Glargine (BASAGLAR KWIKPEN) 100 UNIT/ML Inject 0.35 mLs (35 Units total) into the skin daily.  . Insulin Pen Needle (B-D ULTRAFINE III SHORT PEN) 31G X 8 MM MISC Test blood sugars 2 times daily  . meclizine (ANTIVERT) 25 MG tablet Take 1 tablet (25 mg total) by mouth 3 (three) times daily as needed for dizziness.  . Multiple Vitamin (MULTIVITAMIN) tablet Take 1 tablet by mouth daily.    . ondansetron (ZOFRAN) 8 MG tablet Take 1 tablet (8 mg total) by mouth every 8 (eight) hours as needed for up to 14 days for nausea or vomiting.  . pantoprazole (PROTONIX) 40 MG tablet Take 1 tablet (40 mg total) by mouth daily.  . Semaglutide,0.25 or 0.5MG/DOS, (OZEMPIC, 0.25 OR 0.5 MG/DOSE,) 2 MG/1.5ML SOPN Inject 0.375 mLs (0.5 mg total) into the skin once a week. Inject 0.25 mg once weekly for 4 weeks then increase to 0.5 mg weekly  . telmisartan (MICARDIS) 40 MG tablet TAKE 1 TABLET (40 MG TOTAL) BY MOUTH DAILY.  Marland Kitchen  UNIFINE PENTIPS 31G X 8 MM MISC TEST BLOOD SUGARS 2 TIMES DAILY  . [DISCONTINUED] Insulin Glargine (BASAGLAR KWIKPEN) 100 UNIT/ML Inject 0.45 mLs (45 Units total) into the skin daily. (Patient taking differently: Inject 48 Units into the skin at bedtime. )   No facility-administered encounter medications on file as of 03/29/2020.     Objective:   Goals Addressed              This Visit's Progress     Patient Stated   .  PharmD "I want to be healthy" (pt-stated)        CARE PLAN ENTRY (see longitudinal plan of care for  additional care plan information)  Current Barriers:  . Social, financial, community barriers:  o Denies financial concerns w/ medications. 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 S/p hospitalization, cardiology f/u next week . Diabetes: uncontrolled; complicated by chronic medical conditions including HTN, HLD, most recent A1c 9.3% . Most recent eGFR: >100 mL/min . Current antihyperglycemic regimen:  metformin XR 1000 mg BID, Basaglar 45, Humalog 14 units TID with meals o Hx SGLT2, previously stopped d/t complaints of too frequent polyuria . Does note occasional sensations of hypoglycemia, but is unable to stop at work and check sugars. Asks today about ways to reduce insulin burden . Current meal patterns: frequent higher carb dietary choices, mixes w/ period of religious fasting . Current blood glucose readings:  o Before: 80-150 o After: 200-225 . Cardiovascular risk reduction: cardiology f/u next week o Current hypertensive regimen: telmisartan 40 mg daily o Current hyperlipidemia regimen: atorvastatin 20 mg daily, ezetimibe 10 mg daily, fenofibrate 160 mg daily; last LDL in hospital 131, and patient confirms adherence. TG borderline at 150 o Current antiplatelet regimen: ASA 81 mg daily . Eye exam: up to date . Nephropathy screening: up to date   Pharmacist Clinical Goal(s):  Marland Kitchen Over the next 90 days, patient will work with PharmD and primary care provider to address optimized medication management  Interventions: . Comprehensive medication review performed, medication list updated in electronic medical record . Inter-disciplinary care team collaboration (see longitudinal plan of care) . Discussed addition of SGLT2 vs GLP1 to reduce insulin burden and improve glycemic control. Given inability to tolerate SGLT2 previously, will start GLP1. Discussed Ozempic and counseled on technique. Start Ozempic 0.25 mg x 4 weeks then increase to 0.5 mg weekly. Decrease Tresiba to 35 units daily and  Humalog to 10 units TID with meals to reduce risk of hypoglycemia w/ Ozempic initiation. Patient verbalized understanding.  . Discussed pursuing coverage of DexCom CGM through insurance to allow for improved glycemic review. Patient amenable. Completed PA for DexCom G6 sensor and transmitter today.  . Discussed uncontrolled LDL on current regimen of atorvastatin 20 mg daily and ezetimibe 10 mg daily. Patient denies having tried higher doses of atorvastatin before. Increase to 40 mg daily. Patient verbalizes understanding.  Patient Self Care Activities:  . Patient will check blood glucose at least QID, document, and provide at future appointments . Patient will take medications as prescribed . Patient will report any questions or concerns to provider   Initial goal documentation         Plan:  - Will keep previously scheduled appt next week for f/u of cardiology appt and f/u of CGM PA  Catie Darnelle Maffucci, PharmD, [place], [place] Pharmacist [place] [phone]

## 2020-03-29 NOTE — Progress Notes (Addendum)
Subjective:  Patient ID: Randy Grant, male    DOB: Aug 16, 1965  Age: 54 y.o. MRN: [id number]  CC: The primary encounter diagnosis was Vertigo. Diagnoses of Tinnitus, left ear, Excessive cerumen in ear canal, left, Abnormal EKG, Abnormal electrocardiogram, Uncontrolled type 2 diabetes mellitus with hyperglycemia (HCC), Mixed hyperlipidemia, Hospital discharge follow-up, Chest pain, unspecified type, and Acute gastritis without hemorrhage, unspecified gastritis type were also pertinent to this visit.  HPI Randy Grant presents for  HOSPITAL FOLLOW UP AND FMLA REQUEST  This visit occurred during the SARS-CoV-2 public health emergency.  Safety protocols were in place, including screening questions prior to the visit, additional usage of staff PPE, and extensive cleaning of exam room while observing appropriate contact time as indicated for disinfecting solutions.    ADMITTED TO [facility] ON AUGUST 12 WITH EPIGASTRIC PAIN.,  vomiting (the reason he went to ER)  Accompanied by vertigo and  Left ear tinnitus     Noninvasive cardiac workup was negative for ischemia , WMA/low EF  , but his baseline EKG is abnormal (LAFB) and he had TWI in V2 and V3 .  He was diagnosed with gastritis and given advice on dietary changes  along with  with rx for PPI therapy .  All labs , imaging studies and progress notes from admission were reviewed with patient today . He had been previously referred to cardiology and had missed a scheduled Myoview.   Chest pain has not recurred..  He believes he has cardiology follow up on Monday  (chart shows that the provider cancelled /moved it). This was confirmed by a call to cardiology office   Uncontrolled diabetes ;  Needs  Continuous glucose monitoring.. brought catie in to discuss since has missed several appointments with her .   FMLA:  Missed  5 days of work this week,  Is out Until Tuesday, sees cardiology on Monday .  Still too dizzy to work ,  Afraid he will lose  balance and fall   Outpatient Medications Prior to Visit  Medication Sig Dispense Refill  . ACCU-CHEK GUIDE test strip USE AS INSTRUCTED 100 strip 5  . aspirin 81 MG tablet Take 81 mg by mouth daily.      . B-D ULTRAFINE III SHORT PEN 31G X 8 MM MISC TEST BLOOD SUGARS 2 TIMES DAILY 100 each 6  . BAYER MICROLET LANCETS lancets USE AS DIRECTED 100 each 5  . ezetimibe (ZETIA) 10 MG tablet Take 1 tablet (10 mg total) by mouth daily. 90 tablet 1  . fenofibrate 160 MG tablet Take 1 tablet (160 mg total) by mouth at bedtime. 90 tablet 1  . glucose blood (ONE TOUCH ULTRA TEST) test strip Use as instructed three times daily to check blood sugars 100 each 12  . Insulin Pen Needle (B-D ULTRAFINE III SHORT PEN) 31G X 8 MM MISC Test blood sugars 2 times daily 100 each 5  . meclizine (ANTIVERT) 25 MG tablet Take 1 tablet (25 mg total) by mouth 3 (three) times daily as needed for dizziness. 90 tablet 0  . metFORMIN (GLUCOPHAGE-XR) 500 MG 24 hr tablet TAKE 2 TABLETS BY MOUTH 2 TIMES DAILY. 360 tablet 1  . Multiple Vitamin (MULTIVITAMIN) tablet Take 1 tablet by mouth daily.      . ondansetron (ZOFRAN) 8 MG tablet Take 1 tablet (8 mg total) by mouth every 8 (eight) hours as needed for up to 14 days for nausea or vomiting. 42 tablet 0  . pantoprazole (PROTONIX) 40  MG tablet Take 1 tablet (40 mg total) by mouth daily. 30 tablet 0  . telmisartan (MICARDIS) 40 MG tablet TAKE 1 TABLET (40 MG TOTAL) BY MOUTH DAILY. 90 tablet 0  . UNIFINE PENTIPS 31G X 8 MM MISC TEST BLOOD SUGARS 2 TIMES DAILY 100 each 5  . atorvastatin (LIPITOR) 20 MG tablet Take 1 tablet (20 mg total) by mouth daily. 90 tablet 3  . HUMALOG KWIKPEN 100 UNIT/ML KwikPen INJECT 14 UNITS TOTAL INTO THE SKIN 3 TIMES DAILY. (Patient taking differently: 18 Units. ) 30 mL 2  . hydrochlorothiazide (HYDRODIURIL) 25 MG tablet Take 1 tablet (25 mg total) by mouth daily. 60 tablet 3  . Insulin Glargine (BASAGLAR KWIKPEN) 100 UNIT/ML Inject 0.45 mLs (45 Units  total) into the skin daily. (Patient taking differently: Inject 48 Units into the skin at bedtime. ) 15 pen 1   No facility-administered medications prior to visit.    Review of Systems;  Patient denies headache, fevers, malaise, unintentional weight loss, skin rash, eye pain, sinus congestion and sinus pain, sore throat, dysphagia,  hemoptysis , cough, dyspnea, wheezing, chest pain, palpitations, orthopnea, edema, abdominal pain, nausea, melena, diarrhea, constipation, flank pain, dysuria, hematuria, urinary  Frequency, nocturia, numbness, tingling, seizures,  Focal weakness, Loss of consciousness,  Tremor, insomnia, depression, anxiety, and suicidal ideation.      Objective:  BP (!) 150/82 (BP Location: Left Arm, Patient Position: Sitting, Cuff Size: Normal)   Pulse 96   Temp 98.7 F (37.1 C) (Oral)   Resp 15   Ht  (1.676 m)   Wt 164 lb 6.4 oz (74.6 kg)   SpO2 99%   BMI 26.53 kg/m   BP Readings from Last 3 Encounters:  04/01/20 140/70  03/29/20 (!) 150/82  03/23/20 (!) 107/51    Wt Readings from Last 3 Encounters:  04/01/20 161 lb 8 oz (73.3 kg)  03/29/20 164 lb 6.4 oz (74.6 kg)  03/23/20 157 lb 9.6 oz (71.5 kg)    General appearance: alert, cooperative and appears stated age Ears: left TM occluded by cerumen,  Normal right  TM Throat: lips, mucosa, and tongue normal; teeth and gums normal Neck: no adenopathy, no carotid bruit, supple, symmetrical, trachea midline and thyroid not enlarged, symmetric, no tenderness/mass/nodules Back: symmetric, no curvature. ROM normal. No CVA tenderness. Lungs: clear to auscultation bilaterally Heart: regular rate and rhythm, S1, S2 normal, no murmur, click, rub or gallop Abdomen: soft, non-tender; bowel sounds normal; no masses,  no organomegaly Pulses: 2+ and symmetric Skin: Skin color, texture, turgor normal. No rashes or lesions Lymph nodes: Cervical, supraclavicular, and axillary nodes normal.  Lab Results  Component Value  Date   HGBA1C 9.3 (H) 01/01/2020   HGBA1C 8.3 (H) 08/31/2019   HGBA1C 8.0 (H) 02/06/2019    Lab Results  Component Value Date   CREATININE 0.74 03/21/2020   CREATININE 0.80 01/29/2020   CREATININE 0.76 01/01/2020    Lab Results  Component Value Date   WBC 5.1 03/23/2020   HGB 13.4 03/23/2020   HCT 41.2 03/23/2020   PLT 316 03/23/2020   GLUCOSE 84 03/21/2020   CHOL 211 (H) 03/21/2020   TRIG 150 (H) 03/21/2020   HDL 50 03/21/2020   LDLDIRECT 130.0 01/01/2020   LDLCALC 131 (H) 03/21/2020   ALT 16 01/29/2020   AST 13 01/29/2020   NA 141 03/21/2020   K 3.7 03/21/2020   CL 102 03/21/2020   CREATININE 0.74 03/21/2020   BUN 11 03/21/2020   CO2 26  03/21/2020   TSH 1.16 03/29/2017   PSA 0.55 01/29/2020   INR 1.0 03/21/2020   HGBA1C 9.3 (H) 01/01/2020   MICROALBUR 1.6 01/01/2020    NM Myocar Multi W/Spect W/Wall Motion / EF  Result Date: 03/22/2020 Pharmacological myocardial perfusion imaging study with no significant  ischemia Normal wall motion, EF estimated at 65% No EKG changes concerning for ischemia at peak stress or in recovery. Low risk scan Signed, Dossie Arbour, MD, Ph.D [facility]   ECHOCARDIOGRAM COMPLETE  Result Date: 03/22/2020    ECHOCARDIOGRAM REPORT   Patient Name:   Randy Grant Date of Exam: 03/22/2020 Medical Rec #:  [id number]          Height:       66.0 in Accession #:    [id number]         Weight:       158.1 lb Date of Birth:  1966/02/27           BSA:          1.810 m Patient Age:    54 years           BP:           117/78 mmHg Patient Gender: M                  HR:           58 bpm. Exam Location:  [facility] Procedure: 2D Echo, Color Doppler and Cardiac Doppler Indications:     R07.9 Chest Pain  History:         Patient has no prior history of Echocardiogram examinations.                  Risk Factors:Hypertension, Diabetes and Dyslipidemia.  Sonographer:     Humphrey Rolls RDCS (AE) Referring Phys:  [id number] Antonieta Iba Diagnosing Phys: Julien Nordmann MD   Sonographer Comments: Suboptimal subcostal window. IMPRESSIONS  1. Left ventricular ejection fraction, by estimation, is 60 to 65%. The left ventricle has normal function. The left ventricle has no regional wall motion abnormalities. Left ventricular diastolic parameters are consistent with Grade I diastolic dysfunction (impaired relaxation).  2. Right ventricular systolic function is normal. The right ventricular size is normal. Tricuspid regurgitation signal is inadequate for assessing PA pressure. FINDINGS  Left Ventricle: Left ventricular ejection fraction, by estimation, is 60 to 65%. The left ventricle has normal function. The left ventricle has no regional wall motion abnormalities. The left ventricular internal cavity size was normal in size. There is  no left ventricular hypertrophy. Left ventricular diastolic parameters are consistent with Grade I diastolic dysfunction (impaired relaxation). Right Ventricle: The right ventricular size is normal. No increase in right ventricular wall thickness. Right ventricular systolic function is normal. Tricuspid regurgitation signal is inadequate for assessing PA pressure. Left Atrium: Left atrial size was normal in size. Right Atrium: Right atrial size was normal in size. Pericardium: There is no evidence of pericardial effusion. Mitral Valve: The mitral valve is normal in structure. Normal mobility of the mitral valve leaflets. Mild mitral valve regurgitation. No evidence of mitral valve stenosis. MV peak gradient, 3.4 mmHg. The mean mitral valve gradient is 1.0 mmHg. Tricuspid Valve: The tricuspid valve is normal in structure. Tricuspid valve regurgitation is not demonstrated. No evidence of tricuspid stenosis. Aortic Valve: The aortic valve is normal in structure. Aortic valve regurgitation is not visualized. No aortic stenosis is present. Aortic valve mean gradient measures 3.0 mmHg. Aortic valve peak  gradient measures 5.6 mmHg. Aortic valve area, by VTI measures  2.94 cm. Pulmonic Valve: The pulmonic valve was normal in structure. Pulmonic valve regurgitation is not visualized. No evidence of pulmonic stenosis. Aorta: The aortic root is normal in size and structure. Venous: The inferior vena cava is normal in size with greater than 50% respiratory variability, suggesting right atrial pressure of 3 mmHg. IAS/Shunts: No atrial level shunt detected by color flow Doppler.  LEFT VENTRICLE PLAX 2D LVIDd:         4.18 cm  Diastology LVIDs:         2.43 cm  LV e' lateral:   11.20 cm/s LV PW:         1.09 cm  LV E/e' lateral: 8.0 LV IVS:        0.75 cm  LV e' medial:    7.94 cm/s LVOT diam:     2.00 cm  LV E/e' medial:  11.3 LV SV:         63 LV SV Index:   35 LVOT Area:     3.14 cm  RIGHT VENTRICLE RV Basal diam:  2.38 cm LEFT ATRIUM             Index       RIGHT ATRIUM           Index LA diam:        3.90 cm 2.16 cm/m  RA Area:     11.40 cm LA Vol (A2C):   38.7 ml 21.39 ml/m RA Volume:   22.70 ml  12.54 ml/m LA Vol (A4C):   18.3 ml 10.11 ml/m LA Biplane Vol: 28.9 ml 15.97 ml/m  AORTIC VALVE                   PULMONIC VALVE AV Area (Vmax):    2.57 cm    PV Vmax:       1.13 m/s AV Area (Vmean):   2.79 cm    PV Vmean:      73.800 cm/s AV Area (VTI):     2.94 cm    PV VTI:        0.215 m AV Vmax:           118.00 cm/s PV Peak grad:  5.1 mmHg AV Vmean:          73.100 cm/s PV Mean grad:  2.0 mmHg AV VTI:            0.214 m AV Peak Grad:      5.6 mmHg AV Mean Grad:      3.0 mmHg LVOT Vmax:         96.60 cm/s LVOT Vmean:        65.000 cm/s LVOT VTI:          0.200 m LVOT/AV VTI ratio: 0.93  AORTA Ao Root diam: 2.70 cm MITRAL VALVE MV Area (PHT): 4.04 cm    SHUNTS MV Peak grad:  3.4 mmHg    Systemic VTI:  0.20 m MV Mean grad:  1.0 mmHg    Systemic Diam: 2.00 cm MV Vmax:       0.92 m/s MV Vmean:      50.9 cm/s MV Decel Time: 188 msec MV E velocity: 90.00 cm/s MV A velocity: 82.70 cm/s MV E/A ratio:  1.09 Julien Nordmann MD Electronically signed by Julien Nordmann MD Signature  Date/Time: 03/22/2020/1:29:51 PM    Final     Assessment & Plan:   Problem List  Items Addressed This Visit      Unprioritized   Abnormal electrocardiogram    With recent twi IN v1-v2 AND elevated troponin during brief admission for chest pain .  Noninvasive workup was negative.  Given his uncontrolled diabetes and hyperlipidemia, he is HIGH RISK for CAD  And would benefit from cardiac cath.  He has follow up appt with cardiology PA on Monday       Chest pain    Relieved with treatment for gastritis, has not recurred since discharge.  Nevertheless he has EKG changes with TWI in V2-3 , and multiple CRFs.  Noninvasive testing was nondiagnostic.  Invasive testing recommended.  He will see Cardiology on Monday       Gastritis    [facility] and currently managed with PPI therapy.       Hospital discharge follow-up    Patient is stable post discharge and has no new issues or questions about discharge plans at the visit today for hospital follow up. All labs , imaging studies and progress notes from admission were reviewed with patient today        Mixed hyperlipidemia    LDL not at goal on 20 mg atorvastaitn..  Increasing to 40 mg today  Lab Results  Component Value Date   CHOL 211 (H) 03/21/2020   HDL 50 03/21/2020   LDLCALC 131 (H) 03/21/2020   LDLDIRECT 130.0 01/01/2020   TRIG 150 (H) 03/21/2020   CHOLHDL 4.2 03/21/2020          Relevant Medications   atorvastatin (LIPITOR) 40 MG tablet   Uncontrolled type 2 diabetes mellitus with hyperglycemia (HCC)    Secondary to cultural food choices, cultural practices which including prolonged fasts ,  And work schedule.  Adding Ozempic and reducing insulin dose to prevent recurrent of hypoglycemic events.   24 hr blood glucose monitor ordered         Relevant Medications   atorvastatin (LIPITOR) 40 MG tablet   Other Relevant Orders   Hemoglobin A1c   Vertigo - Primary    May be due to tympanic  fixation left ear,  From cerumen.Marland Kitchen.  He is unable to work a full day until this is addressed and resolved.  FMLA form to reflect ENT workup      Relevant Orders   Ambulatory referral to ENT    Other Visit Diagnoses    Tinnitus, left ear       Relevant Orders   Ambulatory referral to ENT   Excessive cerumen in ear canal, left       Relevant Orders   Ambulatory referral to ENT   Abnormal EKG       Relevant Orders   Ambulatory referral to Cardiology      I have changed Langford E. Narramore's atorvastatin. I am also having him maintain his multivitamin, aspirin, glucose blood, Insulin Pen Needle, Bayer Microlet Lancets, B-D ULTRAFINE III SHORT PEN, Accu-Chek Guide, metFORMIN, Unifine Pentips, ezetimibe, fenofibrate, telmisartan, meclizine, pantoprazole, and ondansetron.  Meds ordered this encounter  Medications  . atorvastatin (LIPITOR) 40 MG tablet    Sig: Take 1 tablet (40 mg total) by mouth daily.    Dispense:  90 tablet    Refill:  1    Medications Discontinued During This Encounter  Medication Reason  . atorvastatin (LIPITOR) 20 MG tablet     Follow-up: No follow-ups on file.   Sherlene Shamseresa L Rebbeca Sheperd, MD

## 2020-03-29 NOTE — Chronic Care Management (AMB) (Signed)
Chronic Care Management   Note  03/29/2020 Name: Randy Grant MRN: [id number] DOB: 05-25-1966   Subjective:  Randy Grant is a 54 y.o. year old male who is a primary care patient of Tullo, Aris Everts, MD. The CCM team was consulted for assistance with chronic disease management and care coordination needs.     Randy Grant was given information about Chronic Care Management services today including:  1. CCM service includes personalized support from designated clinical staff supervised by his physician, including individualized plan of care and coordination with other care providers 2. 24/7 contact phone numbers for assistance for urgent and routine care needs. 3. The patient may stop CCM services at any time (effective at the end of the month) by phone call to the office staff.  Patient agreed to services and verbal consent obtained.   Review of patient status, including review of consultants reports, laboratory and other test data, was performed as part of comprehensive evaluation and provision of chronic care management services.   SDOH (Social Determinants of Health) assessments and interventions performed:  SDOH Interventions     Most Recent Value  SDOH Interventions  Financial Strain Interventions Intervention Not Indicated       Objective:  Lab Results  Component Value Date   CREATININE 0.74 03/21/2020   CREATININE 0.80 01/29/2020   CREATININE 0.76 01/01/2020    Lab Results  Component Value Date   HGBA1C 9.3 (H) 01/01/2020       Component Value Date/Time   CHOL 211 (H) 03/21/2020 2024   TRIG 150 (H) 03/21/2020 2024   HDL 50 03/21/2020 2024   CHOLHDL 4.2 03/21/2020 2024   VLDL 30 03/21/2020 2024   LDLCALC 131 (H) 03/21/2020 2024   LDLDIRECT 130.0 01/01/2020 1040    Clinical ASCVD: No  The 10-year ASCVD risk score Mikey Bussing DC Jr., et al., 2013) is: 18.3%   Values used to calculate the score:     Age: 33 years     Sex: Male     Is Non-Hispanic  African American: No     Diabetic: Yes     Tobacco smoker: No     Systolic Blood Pressure: 353 mmHg     Is BP treated: Yes     HDL Cholesterol: 50 mg/dL     Total Cholesterol: 211 mg/dL    BP Readings from Last 3 Encounters:  03/29/20 (!) 150/82  03/23/20 (!) 107/51  02/23/20 (!) 160/80    No Known Allergies  Medications Reviewed Today    Reviewed by Adair Laundry, CMA (Certified Medical Assistant) on 03/29/20 at [place] List Status: <None>  Medication Order Taking? Sig Documenting Provider Last Dose Status Informant  ACCU-CHEK GUIDE test strip [id number] Yes USE AS INSTRUCTED Crecencio Mc, MD Taking Active Self  aspirin 81 MG tablet [id number] Yes Take 81 mg by mouth daily.   [provider] Taking Active Self  atorvastatin (LIPITOR) 20 MG tablet [id number] Yes Take 1 tablet (20 mg total) by mouth daily. Crecencio Mc, MD Taking Active Self  B-D ULTRAFINE III SHORT PEN 31G X 8 MM MISC [id number] Yes TEST BLOOD SUGARS 2 TIMES DAILY Crecencio Mc, MD Taking Active Self  BAYER MICROLET LANCETS lancets [id number] Yes USE AS DIRECTED Crecencio Mc, MD Taking Active Self  ezetimibe (ZETIA) 10 MG tablet [id number] Yes Take 1 tablet (10 mg total) by mouth daily. Crecencio Mc, MD Taking Active Self  fenofibrate 160 MG tablet [id number] Yes Take 1  tablet (160 mg total) by mouth at bedtime. Crecencio Mc, MD Taking Active Self  glucose blood (ONE TOUCH ULTRA TEST) test strip [id number] Yes Use as instructed three times daily to check blood sugars Crecencio Mc, MD Taking Active Self  HUMALOG KWIKPEN 100 UNIT/ML KwikPen [id number] Yes INJECT 14 UNITS TOTAL INTO THE SKIN 3 TIMES DAILY. Crecencio Mc, MD Taking Active Self  hydrochlorothiazide (HYDRODIURIL) 25 MG tablet [id number] Yes Take 1 tablet (25 mg total) by mouth daily. Crecencio Mc, MD Taking Active Self  Insulin Glargine [facility]) 100 UNIT/ML [id number] Yes Inject 0.45 mLs (45 Units total) into the skin  daily.  Patient taking differently: Inject 45 Units into the skin at bedtime.    Crecencio Mc, MD Taking Active Self  Insulin Pen Needle (B-D ULTRAFINE III SHORT PEN) 31G X 8 MM MISC [id number] Yes Test blood sugars 2 times daily Crecencio Mc, MD Taking Active Self  meclizine (ANTIVERT) 25 MG tablet [id number] Yes Take 1 tablet (25 mg total) by mouth 3 (three) times daily as needed for dizziness. Wyvonnia Dusky, MD Taking Active   metFORMIN (GLUCOPHAGE-XR) 500 MG 24 hr tablet [id number] Yes TAKE 2 TABLETS BY MOUTH 2 TIMES DAILY. Crecencio Mc, MD Taking Active Self  Multiple Vitamin (MULTIVITAMIN) tablet [id number] Yes Take 1 tablet by mouth daily.   [provider] Taking Active Self  ondansetron (ZOFRAN) 8 MG tablet [id number] Yes Take 1 tablet (8 mg total) by mouth every 8 (eight) hours as needed for up to 14 days for nausea or vomiting. Wyvonnia Dusky, MD Taking Active   pantoprazole (PROTONIX) 40 MG tablet [id number] Yes Take 1 tablet (40 mg total) by mouth daily. Wyvonnia Dusky, MD Taking Active   telmisartan (MICARDIS) 40 MG tablet [id number] Yes TAKE 1 TABLET (40 MG TOTAL) BY MOUTH DAILY. Crecencio Mc, MD Taking Active Self  UNIFINE PENTIPS 31G X 8 MM MISC [id number] Yes TEST BLOOD SUGARS 2 TIMES DAILY Crecencio Mc, MD Taking Active Self           Assessment:   Goals Addressed              This Visit's Progress     Patient Stated   .  PharmD "I want to be healthy" (pt-stated)        CARE PLAN ENTRY (see longitudinal plan of care for additional care plan information)  Current Barriers:  . Social, financial, community barriers:  o Denies financial concerns w/ medications. 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 S/p hospitalization, cardiology f/u next week . Diabetes: uncontrolled; complicated by chronic medical conditions including HTN, HLD, most recent A1c 9.3% . Most recent eGFR: >100 mL/min . Current antihyperglycemic regimen:  metformin XR  1000 mg BID, Basaglar 45, Humalog 14 units TID with meals o Hx SGLT2, previously stopped d/t complaints of too frequent polyuria . Does note occasional sensations of hypoglycemia, but is unable to stop at work and check sugars. Asks today about ways to reduce insulin burden . Current meal patterns: frequent higher carb dietary choices, mixes w/ period of religious fasting . Current blood glucose readings:  o Before: 80-150 o After: 200-225 . Cardiovascular risk reduction: cardiology f/u next week o Current hypertensive regimen: telmisartan 40 mg daily o Current hyperlipidemia regimen: atorvastatin 20 mg daily, ezetimibe 10 mg daily, fenofibrate 160 mg daily; last LDL in hospital 131, and patient confirms adherence. TG borderline at 150 o Current antiplatelet regimen: ASA 81  mg daily . Eye exam: up to date . Nephropathy screening: up to date   Pharmacist Clinical Goal(s):  Marland Kitchen Over the next 90 days, patient will work with PharmD and primary care provider to address optimized medication management  Interventions: . Comprehensive medication review performed, medication list updated in electronic medical record . Inter-disciplinary care team collaboration (see longitudinal plan of care) . Discussed addition of SGLT2 vs GLP1 to reduce insulin burden and improve glycemic control. Given inability to tolerate SGLT2 previously, will start GLP1. Discussed Ozempic and counseled on technique. Start Ozempic 0.25 mg x 4 weeks then increase to 0.5 mg weekly. Decrease Tresiba to 35 units daily and Humalog to 10 units TID with meals to reduce risk of hypoglycemia w/ Ozempic initiation. Patient verbalized understanding.  . Discussed pursuing coverage of DexCom CGM through insurance to allow for improved glycemic review. Patient amenable. Completed PA for DexCom G6 sensor and transmitter today.  . Discussed uncontrolled LDL on current regimen of atorvastatin 20 mg daily and ezetimibe 10 mg daily. Patient denies  having tried higher doses of atorvastatin before. Increase to 40 mg daily. Patient verbalizes understanding.  Patient Self Care Activities:  . Patient will check blood glucose at least QID, document, and provide at future appointments . Patient will take medications as prescribed . Patient will report any questions or concerns to provider   Initial goal documentation        Plan: - Will keep f/u appt scheduled next week to f/u on CGM PA and outcome of cardiology appt  Catie Darnelle Maffucci, PharmD, [place], [place] Pharmacist [place] [phone]

## 2020-03-29 NOTE — Assessment & Plan Note (Signed)
With recent twi IN v1-v2 AND elevated troponin during brief admission for chest pain .  Noninvasive workup was negative.  Given his uncontrolled diabetes and hyperlipidemia, he is HIGH RISK for CAD  And would benefit from cardiac cath.  He has follow up appt with cardiology PA on Monday

## 2020-03-29 NOTE — Patient Instructions (Signed)
Your appt is with cardiology is Monday At 2:00 PM  With Michaelle Birks,    I am recommending  that you be set up for cath  I have also ordered an urgent ENT referral for your ear problem

## 2020-03-29 NOTE — Assessment & Plan Note (Signed)
Secondary to cultural food choices, cultural practices which including prolonged fasts ,  And work schedule.  Adding Ozempic and reducing insulin dose to prevent recurrent of hypoglycemic events.   24 hr blood glucose monitor ordered

## 2020-03-29 NOTE — Assessment & Plan Note (Signed)
May be due to tympanic fixation left ear,  From cerumen.Marland Kitchen  He is unable to work a full day until this is addressed and resolved.  FMLA form to reflect ENT workup

## 2020-03-29 NOTE — Assessment & Plan Note (Signed)
Patient is stable post discharge and has no new issues or questions about discharge plans at the visit today for hospital follow up. All labs , imaging studies and progress notes from admission were reviewed with patient today   

## 2020-04-01 ENCOUNTER — Other Ambulatory Visit: Payer: 59

## 2020-04-01 ENCOUNTER — Encounter: Payer: Self-pay | Admitting: Physician Assistant

## 2020-04-01 ENCOUNTER — Ambulatory Visit: Payer: 59 | Admitting: Pharmacist

## 2020-04-01 ENCOUNTER — Other Ambulatory Visit: Payer: Self-pay

## 2020-04-01 ENCOUNTER — Ambulatory Visit: Payer: 59 | Admitting: Physician Assistant

## 2020-04-01 VITALS — BP 140/70 | HR 96 | Ht 66.0 in | Wt 161.5 lb

## 2020-04-01 DIAGNOSIS — E11319 Type 2 diabetes mellitus with unspecified diabetic retinopathy without macular edema: Secondary | ICD-10-CM

## 2020-04-01 DIAGNOSIS — R000 Tachycardia, unspecified: Secondary | ICD-10-CM | POA: Diagnosis not present

## 2020-04-01 DIAGNOSIS — E1165 Type 2 diabetes mellitus with hyperglycemia: Secondary | ICD-10-CM | POA: Diagnosis not present

## 2020-04-01 DIAGNOSIS — I10 Essential (primary) hypertension: Secondary | ICD-10-CM | POA: Diagnosis not present

## 2020-04-01 DIAGNOSIS — R42 Dizziness and giddiness: Secondary | ICD-10-CM

## 2020-04-01 DIAGNOSIS — Z09 Encounter for follow-up examination after completed treatment for conditions other than malignant neoplasm: Secondary | ICD-10-CM

## 2020-04-01 DIAGNOSIS — R9431 Abnormal electrocardiogram [ECG] [EKG]: Secondary | ICD-10-CM

## 2020-04-01 DIAGNOSIS — IMO0002 Reserved for concepts with insufficient information to code with codable children: Secondary | ICD-10-CM

## 2020-04-01 MED ORDER — CARVEDILOL 3.125 MG PO TABS: 3.1250 mg | ORAL_TABLET | Freq: Two times a day (BID) | ORAL | 1 refills | Status: DC

## 2020-04-01 NOTE — Patient Instructions (Signed)
Visit Information  Goals Addressed              This Visit's Progress     Patient Stated   .  PharmD "I want to be healthy" (pt-stated)        CARE PLAN ENTRY (see longitudinal plan of care for additional care plan information)  Current Barriers:  . Social, financial, community barriers:  o Following up on DexCom CGM prescription . Diabetes: uncontrolled; complicated by chronic medical conditions including HTN, HLD, most recent A1c 9.3% . Most recent eGFR: >100 mL/min . Current antihyperglycemic regimen:  metformin XR 1000 mg BID, Basaglar 35 units daily,  Humalog 10 units TID with meals, Ozempic 0.25 mg weekly (started last week) o Hx SGLT2, previously stopped d/t complaints of too frequent polyuria . Current meal patterns: frequent higher carb dietary choices, mixes w/ period of religious fasting . Cardiovascular risk reduction: cardiology f/u today o Current hypertensive regimen: telmisartan 40 mg daily o Current hyperlipidemia regimen: atorvastatin 40 mg daily, ezetimibe 10 mg daily, fenofibrate 160 mg daily; LDL 131 prior to increasing atorvastatin to 40 mg. TG borderline at 150 o Current antiplatelet regimen: ASA 81 mg daily . Eye exam: up to date . Nephropathy screening: up to date   Pharmacist Clinical Goal(s):  Marland Kitchen Over the next 90 days, patient will work with PharmD and primary care provider to address optimized medication management  Interventions: . Comprehensive medication review performed, medication list updated in electronic medical record . Inter-disciplinary care team collaboration (see longitudinal plan of care) . [place]. They note they do not have the contracting to run DexCom, so the script needs to be run by [employer]. Contacted [employer], the cost for 1 transmitter (90 day supply) is $90, the cost for 3 sensors (30 day supply) is $62.47. In total, this monthly cost is more expensive than paying out of pocket for FreeStyle Libre for  $75/month. Will discuss this with patient at our appointment later this week  Patient Self Care Activities:  . Patient will check blood glucose at least QID, document, and provide at future appointments . Patient will take medications as prescribed . Patient will report any questions or concerns to provider   Please see past updates related to this goal by clicking on the "Past Updates" button in the selected goal         The patient verbalized understanding of instructions provided today and declined a print copy of patient instruction materials.   Plan:  - Will talk to patient on Wednesday as scheduled.   Catie Darnelle Maffucci, PharmD, [place], CPP Clinical Pharmacist [place] [phone]

## 2020-04-01 NOTE — Progress Notes (Signed)
Office Visit    Patient Name: Randy Grant Date of Encounter: 04/01/2020  Primary Care Provider:  Sherlene Shams, MD Primary Cardiologist:  No primary care provider on file.  Chief Complaint    Chief Complaint  Patient presents with  . other    Follow up from [facility]; dizziness & epigastric pain. Meds reviewed by the pt. verbally. Pt. c/o some dizziness.     54 year old male with history of exertional tachycardia and shortness of breath, bifascicular block noted 02/2020, hypertension, hyperlipidemia, poorly controlled DM2, previous smoker (states quit 15-20 years ago), GERD on PPI, dizziness on meclizine, and recent admission for emesis and dizziness with stress testing performed and low risk, and seen for hospital follow-up.  Past Medical History    Past Medical History:  Diagnosis Date  . Cataract 2017   left  . Diabetes mellitus   . GERD (gastroesophageal reflux disease)   . Hyperlipidemia   . Hypertension    History reviewed. No pertinent surgical history.  Allergies  No Known Allergies  History of Present Illness    Randy Grant is a 54 y.o. male with PMH as above.   He was initially referred to [facility] cardiology at the request of Dr. Darrick Huntsman for exertional tachycardia and shortness of breath.  He has a history of hypertension, hyperlipidemia, DM2, and GERD.  He reported when seen in clinic 02/23/2020 that his symptoms had started 2 weeks prior and seemed to be improving.  He reported faster heart rate and pumping when exerting himself.  No chest pain, shortness of breath, lightheadedness, or edema.  Exertional dyspnea had previously been noted by his PCP, though he denied this at his visit.  No reported history of prior cardiac disease or testing.  He had longstanding heartburn.  He wondered if his elevated rates could be due to sleep deprivation, as he was only sleeping 4 to 5 hours a day when his symptoms began.  EKG showed bifascicular block of  uncertain chronicity.  Given his risk factors, it was noted that we cannot completely rule out ischemic heart disease.  Echo and Lexiscan were recommended. He was continued on atorvastatin, though future escalation was noted as possible.  BP was elevated with better readings at home and medication changes deferred.  It was noted his A1c was elevated and he should follow-up with Dr. Darrick Huntsman.  Stress test and echo were ordered; however, he missed his appointment for stress test.  Echo showed normal pump function 60 to 65%, G1DD, and no valvular dz.   He was admitted 03/22/2020 with cardiology consulted.  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  He reported epigastric pain with 4 episodes of emesis. It was documented he had an episode of emesis in the ER.  He received a GI cocktail with no concerning changes noted on EKG per cardiology consultation and NSR with bifascicular block. He reported significant dizziness and unsteadiness on his feet. It was noted that he had symptoms of dizziness, concerning for vertigo.  The symptoms worsened after several episodes of vomiting. In response, meclizine was started.  He was started on Protonix as it was suspected his sx of emesis were likely 2/2 GERD with pt report that he eats a lot of spicy foods. Stress test was performed without ischemia and ruled a low risk study with normal ejection fraction.  During his admission, labs run and showed LDL 131 with  total cholesterol 211. Renal function was stable with Cr 0.74 and BUN 11. K 3.7.  Today, he returns to clinic and notes improvement in his dizziness since starting on meclizine.  He has an upcoming appointment to speak with ENT regarding his unsteadiness on his feet.  Specifically, he notes that he is not able to stand on one leg at a time.  He otherwise denies further dizziness. Sx of emesis and nausea improved with PPI and dizziness improved with meclizine. He  denies any chest pain, racing heart rate, or palpitations. He reports improvement in his previously reported dyspnea/pounding heart with exertion ever since he saw Dr. Okey Dupre on 02/23/2020.  He attributes his most recent episode of epigastric symptoms to eating spicy food, as outlined above, and has been attempting to eat less spicy foods.  He reports his PCP reviewed his admission and has recommended catheterization. On review of EMR, this is 2/2 the TWI noted in V1/2 with repolarization abnormality (located this EKG under 8/12 at 20:29).  His blood pressure is mildly elevated today. He states he is no longer taking HCTZ, given he was told to stop it given a side effect that was potentially dangerous.  He reports he used to really like the medication and was upset to come off of it but advised to throw out the medication. On review of discharge paperwork, his HCTZ is still listed. On review of most recent PCP visit, his HCTZ is still listed. ?Unclear if he was told that, given his emesis, there may be dehydration concerns. Today, he reports he stays very well hydrated; however, his resting HR is 96 and at time escalates into the 110s during our visit. Will need to clarify this with his PCP. On review of EMR, I am unable to find the reason this medication was discontinued. He does not have any new allergies reported; however, given his report today, I am hesitant to restart HCTZ without first clarifying the reason it was discontinued. He otherwise reports medication compliance.  No signs or symptoms consistent with bleeding.  Further recommendations as below.  Home Medications    Prior to Admission medications   Medication Sig Start Date End Date Taking? Authorizing Provider  ACCU-CHEK GUIDE test strip USE AS INSTRUCTED 08/01/19  Yes Sherlene Shams, MD  aspirin 81 MG tablet Take 81 mg by mouth daily.     Yes [provider]  atorvastatin (LIPITOR) 40 MG tablet Take 1 tablet (40 mg total) by mouth  daily. 03/29/20  Yes Sherlene Shams, MD  B-D ULTRAFINE III SHORT PEN 31G X 8 MM MISC TEST BLOOD SUGARS 2 TIMES DAILY 10/11/17  Yes Sherlene Shams, MD  BAYER MICROLET LANCETS lancets USE AS DIRECTED 11/20/16  Yes Sherlene Shams, MD  Continuous Blood Gluc Sensor (DEXCOM G6 SENSOR) MISC Use to check blood sugar at least 4 times daily 03/29/20  Yes Sherlene Shams, MD  Continuous Blood Gluc Transmit (DEXCOM G6 TRANSMITTER) MISC Use to check sugar at least 4 times daily 03/29/20  Yes Sherlene Shams, MD  ezetimibe (ZETIA) 10 MG tablet Take 1 tablet (10 mg total) by mouth daily. 12/28/19  Yes Sherlene Shams, MD  fenofibrate 160 MG tablet Take 1 tablet (160 mg total) by mouth at bedtime. 12/28/19  Yes Sherlene Shams, MD  glucose blood (ONE TOUCH ULTRA TEST) test strip Use as instructed three times daily to check blood sugars 09/11/13  Yes Sherlene Shams, MD  hydrochlorothiazide (HYDRODIURIL) 25 MG  tablet Take 1 tablet (25 mg total) by mouth daily. 09/15/19  Yes Sherlene Shams, MD  Insulin Glargine (BASAGLAR KWIKPEN) 100 UNIT/ML Inject 0.35 mLs (35 Units total) into the skin daily. 03/29/20  Yes Sherlene Shams, MD  insulin lispro (HUMALOG KWIKPEN) 100 UNIT/ML KwikPen Inject 0.1 mLs (10 Units total) into the skin 3 (three) times daily. 03/29/20  Yes Sherlene Shams, MD  Insulin Pen Needle (B-D ULTRAFINE III SHORT PEN) 31G X 8 MM MISC Test blood sugars 2 times daily 01/17/14  Yes Sherlene Shams, MD  meclizine (ANTIVERT) 25 MG tablet Take 1 tablet (25 mg total) by mouth 3 (three) times daily as needed for dizziness. 03/23/20 04/22/20 Yes Charise Killian, MD  metFORMIN (GLUCOPHAGE-XR) 500 MG 24 hr tablet TAKE 2 TABLETS BY MOUTH 2 TIMES DAILY. 12/11/19  Yes Sherlene Shams, MD  Multiple Vitamin (MULTIVITAMIN) tablet Take 1 tablet by mouth daily.     Yes [provider]  ondansetron (ZOFRAN) 8 MG tablet Take 1 tablet (8 mg total) by mouth every 8 (eight) hours as needed for up to 14 days for nausea or vomiting.  03/23/20 04/06/20 Yes Charise Killian, MD  pantoprazole (PROTONIX) 40 MG tablet Take 1 tablet (40 mg total) by mouth daily. 03/23/20 04/22/20 Yes Charise Killian, MD  Semaglutide,0.25 or 0.5MG /DOS, (OZEMPIC, 0.25 OR 0.5 MG/DOSE,) 2 MG/1.5ML SOPN Inject 0.375 mLs (0.5 mg total) into the skin once a week. Inject 0.25 mg once weekly for 4 weeks then increase to 0.5 mg weekly 03/29/20  Yes Sherlene Shams, MD  telmisartan (MICARDIS) 40 MG tablet TAKE 1 TABLET (40 MG TOTAL) BY MOUTH DAILY. 03/19/20  Yes Sherlene Shams, MD  UNIFINE PENTIPS 31G X 8 MM MISC TEST BLOOD SUGARS 2 TIMES DAILY 12/11/19  Yes Sherlene Shams, MD    Review of Systems    He denies chest pain, palpitations, dyspnea, pnd, orthopnea, further n, further v, syncope, edema, weight gain, or early satiety.  He denies further episodes of emesis like the one that sent him to the ED and since starting his PPI.  He reports ongoing dizziness and unsteadiness when standing on one leg at a time.  His dizziness has improved with meclizine..   All other systems reviewed and are otherwise negative except as noted above.  Physical Exam    VS:  BP 140/70 (BP Location: Left Arm, Patient Position: Sitting, Cuff Size: Normal)   Pulse 96   Ht 5\' 6"  (1.676 m)   Wt 161 lb 8 oz (73.3 kg)   SpO2 98%   BMI 26.07 kg/m  , BMI Body mass index is 26.07 kg/m. GEN: Well nourished, well developed, in no acute distress. HEENT: normal. Neck: Supple, no JVD, carotid bruits, or masses. Cardiac: tachycardic but regular, no murmurs, rubs, or gallops. No clubbing, cyanosis, edema.  Radials/DP/PT 2+ and equal bilaterally.  Respiratory:  Respirations regular and unlabored, clear to auscultation bilaterally. GI: Soft, nontender, nondistended, BS + x 4. MS: no deformity or atrophy. Skin: warm and dry, no rash. Neuro:  Strength and sensation are intact. Psych: Normal affect.  Accessory Clinical Findings    ECG personally reviewed by me today - NSR, 93bpm, QRS  , bifascicular block, incomplete RBBB, LAFB - no acute changes.  VITALS Reviewed today   Temp Readings from Last 3 Encounters:  03/29/20 98.7 F (37.1 C) (Oral)  03/23/20 98 F (36.7 C) (Oral)  01/29/20 (!) 97.3 F (36.3 C)   BP Readings  from Last 3 Encounters:  04/01/20 140/70  03/29/20 (!) 150/82  03/23/20 (!) 107/51   Pulse Readings from Last 3 Encounters:  04/01/20 96  03/29/20 96  03/23/20 60    Wt Readings from Last 3 Encounters:  04/01/20 161 lb 8 oz (73.3 kg)  03/29/20 164 lb 6.4 oz (74.6 kg)  03/23/20 157 lb 9.6 oz (71.5 kg)     LABS  reviewed today   Lab Results  Component Value Date   WBC 5.1 03/23/2020   HGB 13.4 03/23/2020   HCT 41.2 03/23/2020   MCV 82.9 03/23/2020   PLT 316 03/23/2020   Lab Results  Component Value Date   CREATININE 0.74 03/21/2020   BUN 11 03/21/2020   NA 141 03/21/2020   K 3.7 03/21/2020   CL 102 03/21/2020   CO2 26 03/21/2020   Lab Results  Component Value Date   ALT 16 01/29/2020   AST 13 01/29/2020   ALKPHOS 57 01/29/2020   BILITOT 0.5 01/29/2020   Lab Results  Component Value Date   CHOL 211 (H) 03/21/2020   HDL 50 03/21/2020   LDLCALC 131 (H) 03/21/2020   LDLDIRECT 130.0 01/01/2020   TRIG 150 (H) 03/21/2020   CHOLHDL 4.2 03/21/2020    Lab Results  Component Value Date   HGBA1C 9.3 (H) 01/01/2020   Lab Results  Component Value Date   TSH 1.16 03/29/2017     STUDIES/PROCEDURES reviewed today   Echo 03/22/20 1. Left ventricular ejection fraction, by estimation, is 60 to 65%. The  left ventricle has normal function. The left ventricle has no regional  wall motion abnormalities. Left ventricular diastolic parameters are  consistent with Grade I diastolic  dysfunction (impaired relaxation).  2. Right ventricular systolic function is normal. The right ventricular  size is normal. Tricuspid regurgitation signal is inadequate for assessing  PA pressure.   NM Study Pharmacological myocardial  perfusion imaging study with no significant  ischemia Normal wall motion, EF estimated at 65% No EKG changes concerning for ischemia at peak stress or in recovery. Low risk scan   Assessment & Plan    Tachycardia Bifascicular block --Reportedly asx. Denies racing HR or palpitations and reports resolved DOE. He does not feel that his previous / improving dizziness is connected to elevated HR. He describes it today only as unsteadiness.  --Given his risk factors for CAD with bifascicular block of unknown chronicity, he underwent further ischemic workup with stress testing and echo that were both unrevealing.  --Recently, he was admitted with dizziness. It is noted on review of this admission that his rate was well controlled while admitted. Today, his rate increases into the 110s while speaking.  --TSH wnl. 8/12 K noted to be 3.7. Preference to seen ENT before repeating a BMET, given his improved sx. --Given his variable rates and pressures noted over the last couple of months, as well as his bifascicular block, I discussed my concern that he may have an arrhythmia not captured yet on EKG or telemetry. We discussed my concern that his earlier pre-admission dizziness and emesis could be due to arrhythmia / tachycardic rates, possibly even triggered by dehydration due to his emesis. --Recommendation was for Zio, which could also help with determining if rate control should be initiated based on what is seen on monitoring. Patient preference was to defer.  He prefers to first follow-up with ENT.  --Given his rates today, started on low dose Coreg 3.125mg  BID. Informed pt that this is not ideal,  given his bifascicular block and variable rates on admission; therefore, I will reach out to his primary cardiologist with further feedback regarding initiation of this medication at that time if provided. He is aware he may receive a phone call in the next couple of days. Recommend control BP and DM2, as these  could certainly exacerbate the above.   HTN, sub-optimal --BP today elevated, though it was well controlled during his admission on HCTZ. He reports that, since his admission, he has thrown out HCTZ. He stated his PCP said he should not take it 2/2 "dangerous side effects," which will need clarified before we can restart this medication. He was unable to clarify if he had the dangerous side effects or was told about them. Will reach out to Dr. Darrick Huntsmanullo. Continue current medications. As above, will reach out to Dr. Okey DupreEnd regarding Coreg. Continue ARB with further recommendations regarding Coreg and HCTZ pending discussion with care team. Low salt diet. Recommend adequate hydration though fluid intake under 2L total daily.   DOE, resolved --He reports resolution in his DOE since seeing Dr. Okey DupreEnd 02/23/2020. He is uncertain the reason for this improvement; however he states it has not recurred.   Dizziness --Reports improvement since admission and attributed to meclizine. Unclear etiology. Consider that his dizziness could in part be due to his tachycardia. Other etiologies include dehydration +/- electrolyte abnormality and subsequent arrhythmia. Also considered is vasovagal etiology, given his emesis. Offered a Zio monitor with pt preference to decline until after upcoming ENT visit. Reassess at RTC.   ?HCTZ allergy?  He reports he is no longer on HCTZ for unclear reasons. Unfortunately, given his report of concerning side effects, we will need to clarify the reason he threw out this medication before restarting it. Given his recent emesis, this may have been discontinued until resolution of his GI sx; however, this is unclear.  Review of cardiac workup --He reports concern that he may need a cardiac catheterization, as advised by his PCP. Reviewed his low risk stress test and echo with nl EF and NRWMA. He does have cardiac risk factors including hypertension, hyperlipidemia, DM2, male gender, and prior  tobacco use.  Discussed need to control his risk factors, such as his LDL, DM2, BP, and HR. Zio monitoring recommended, given his tachycardic rates today, and as BB is not ideal with his bifascicular block. Pt declined. Will start low dose Coreg 3.125mg  BID and reach out to Dr. Okey DupreEnd. It was discussed that this is not the ideal medication, given his bifascicular block; therefore, will need to discuss with MD as well. Further recommendations at that time.  DM2 --Glycemic control recommended. A1C 9.3. Also consider this as possible etiology of his nausea and dizziness, as well as electrolyte abnormalities, which was discussed with the pt.   HLD --Continue statin, Zetia, fenofibrate.   Medication changes: ?Coreg 3.125mg  BID (pending confirmation with Dr. Okey DupreEnd). Need to clarify if HCTZ allergy and why he was advised to throw away. Labs ordered: None Studies / Imaging ordered: None Future considerations: Endocrinology referral, Zio, carotids (if ongoing dizziness) Disposition: RTC 3 months or sooner    Lennon AlstromJacquelyn D Danisha Brassfield, PA-C 04/01/2020

## 2020-04-01 NOTE — Patient Instructions (Signed)
Medication Instructions:  - Your physician has recommended you make the following change in your medication:   1) START coreg (carvedilol) 3.125 mg- take 1 tablet by mouth twice daily   *If you need a refill on your cardiac medications before your next appointment, please call your pharmacy*   Lab Work: - none ordered  If you have labs (blood work) drawn today and your tests are completely normal, you will receive your results only by: Marland Kitchen MyChart Message (if you have MyChart) OR . A paper copy in the mail If you have any lab test that is abnormal or we need to change your treatment, we will call you to review the results.   Testing/Procedures: - none ordered   Follow-Up: At [facility], you and your health needs are our priority.  As part of our continuing mission to provide you with exceptional heart care, we have created designated Provider Care Teams.  These Care Teams include your primary Cardiologist (physician) and Advanced Practice Providers (APPs -  Physician Assistants and Nurse Practitioners) who all work together to provide you with the care you need, when you need it.  We recommend signing up for the patient portal called "MyChart".  Sign up information is provided on this After Visit Summary.  MyChart is used to connect with patients for Virtual Visits (Telemedicine).  Patients are able to view lab/test results, encounter notes, upcoming appointments, etc.  Non-urgent messages can be sent to your provider as well.   To learn more about what you can do with MyChart, go to [email or web address].    Your next appointment:   3 month(s)  The format for your next appointment:   In Person  Provider:    You may see Yvonne Kendall, MD or one of the following Advanced Practice Providers on your designated Care Team:    Nicolasa Ducking, NP  Eula Listen, PA-C  Marisue Ivan, PA-C    Other Instructions - Call the office in 2 weeks to update [place] as to how you are  doing on the new medication (blood pressure & heart rate numbers if possible) - [phone]

## 2020-04-01 NOTE — Chronic Care Management (AMB) (Signed)
Chronic Care Management   Follow Up Note   04/01/2020 Name: Randy Grant MRN: [id number] DOB: 02/17/1966  Referred by: Tullo, Teresa L, MD Reason for referral : Chronic Care Management (Medication Management)   Randy Grant is a 54 y.o. year old male who is a primary care patient of Tullo, Teresa L, MD. The CCM team was consulted for assistance with chronic disease management and care coordination needs.    Care coordination completed today.   Review of patient status, including review of consultants reports, relevant laboratory and other test results, and collaboration with appropriate care team members and the patient's provider was performed as part of comprehensive patient evaluation and provision of chronic care management services.    SDOH (Social Determinants of Health) assessments performed: No See Care Plan activities for detailed interventions related to SDOH)     Outpatient Encounter Medications as of 04/01/2020  Medication Sig  . ACCU-CHEK GUIDE test strip USE AS INSTRUCTED  . aspirin 81 MG tablet Take 81 mg by mouth daily.    . atorvastatin (LIPITOR) 40 MG tablet Take 1 tablet (40 mg total) by mouth daily.  . B-D ULTRAFINE III SHORT PEN 31G X 8 MM MISC TEST BLOOD SUGARS 2 TIMES DAILY  . BAYER MICROLET LANCETS lancets USE AS DIRECTED  . carvedilol (COREG) 3.125 MG tablet Take 1 tablet (3.125 mg total) by mouth 2 (two) times daily with a meal.  . Continuous Blood Gluc Sensor (DEXCOM G6 SENSOR) MISC Use to check blood sugar at least 4 times daily  . Continuous Blood Gluc Transmit (DEXCOM G6 TRANSMITTER) MISC Use to check sugar at least 4 times daily  . ezetimibe (ZETIA) 10 MG tablet Take 1 tablet (10 mg total) by mouth daily.  . fenofibrate 160 MG tablet Take 1 tablet (160 mg total) by mouth at bedtime.  . glucose blood (ONE TOUCH ULTRA TEST) test strip Use as instructed three times daily to check blood sugars  . Insulin Glargine (BASAGLAR KWIKPEN) 100 UNIT/ML  Inject 0.35 mLs (35 Units total) into the skin daily.  . insulin lispro (HUMALOG KWIKPEN) 100 UNIT/ML KwikPen Inject 0.1 mLs (10 Units total) into the skin 3 (three) times daily.  . Insulin Pen Needle (B-D ULTRAFINE III SHORT PEN) 31G X 8 MM MISC Test blood sugars 2 times daily  . meclizine (ANTIVERT) 25 MG tablet Take 1 tablet (25 mg total) by mouth 3 (three) times daily as needed for dizziness.  . metFORMIN (GLUCOPHAGE-XR) 500 MG 24 hr tablet TAKE 2 TABLETS BY MOUTH 2 TIMES DAILY.  . Multiple Vitamin (MULTIVITAMIN) tablet Take 1 tablet by mouth daily.    . ondansetron (ZOFRAN) 8 MG tablet Take 1 tablet (8 mg total) by mouth every 8 (eight) hours as needed for up to 14 days for nausea or vomiting.  . pantoprazole (PROTONIX) 40 MG tablet Take 1 tablet (40 mg total) by mouth daily.  . Semaglutide,0.25 or 0.5MG/DOS, (OZEMPIC, 0.25 OR 0.5 MG/DOSE,) 2 MG/1.5ML SOPN Inject 0.375 mLs (0.5 mg total) into the skin once a week. Inject 0.25 mg once weekly for 4 weeks then increase to 0.5 mg weekly  . telmisartan (MICARDIS) 40 MG tablet TAKE 1 TABLET (40 MG TOTAL) BY MOUTH DAILY.  . UNIFINE PENTIPS 31G X 8 MM MISC TEST BLOOD SUGARS 2 TIMES DAILY   No facility-administered encounter medications on file as of 04/01/2020.     Objective:   Goals Addressed                This Visit's Progress     Patient Stated   .  PharmD "I want to be healthy" (pt-stated)        CARE PLAN ENTRY (see longitudinal plan of care for additional care plan information)  Current Barriers:  . Social, financial, community barriers:  o Following up on DexCom CGM prescription . Diabetes: uncontrolled; complicated by chronic medical conditions including HTN, HLD, most recent A1c 9.3% . Most recent eGFR: >100 mL/min . Current antihyperglycemic regimen:  metformin XR 1000 mg BID, Basaglar 35 units daily,  Humalog 10 units TID with meals, Ozempic 0.25 mg weekly (started last week) o Hx SGLT2, previously stopped d/t complaints of  too frequent polyuria . Current meal patterns: frequent higher carb dietary choices, mixes w/ period of religious fasting . Cardiovascular risk reduction: cardiology f/u today o Current hypertensive regimen: telmisartan 40 mg daily o Current hyperlipidemia regimen: atorvastatin 40 mg daily, ezetimibe 10 mg daily, fenofibrate 160 mg daily; LDL 131 prior to increasing atorvastatin to 40 mg. TG borderline at 150 o Current antiplatelet regimen: ASA 81 mg daily . Eye exam: up to date . Nephropathy screening: up to date   Pharmacist Clinical Goal(s):  Marland Kitchen Over the next 90 days, patient will work with PharmD and primary care provider to address optimized medication management  Interventions: . Comprehensive medication review performed, medication list updated in electronic medical record . Inter-disciplinary care team collaboration (see longitudinal plan of care) . [place]. They note they do not have the contracting to run DexCom, so the script needs to be run by [facility] [employer]. Contacted [employer], the cost for 1 transmitter (90 day supply) is $90, the cost for 3 sensors (30 day supply) is $62.47. In total, this monthly cost is more expensive than paying out of pocket for FreeStyle Libre for $75/month. Will discuss this with patient at our appointment later this week  Patient Self Care Activities:  . Patient will check blood glucose at least QID, document, and provide at future appointments . Patient will take medications as prescribed . Patient will report any questions or concerns to provider   Please see past updates related to this goal by clicking on the "Past Updates" button in the selected goal          Plan:  - Will talk to patient on Wednesday as scheduled.   Catie Darnelle Maffucci, PharmD, [place], CPP Clinical Pharmacist [place] [phone]

## 2020-04-02 DIAGNOSIS — H6123 Impacted cerumen, bilateral: Secondary | ICD-10-CM | POA: Diagnosis not present

## 2020-04-02 NOTE — Assessment & Plan Note (Signed)
DIAGNOSED during [facility] admission  August 2021 and currently managed with PPI therapy.

## 2020-04-02 NOTE — Assessment & Plan Note (Signed)
Relieved with treatment for gastritis, has not recurred since discharge.  Nevertheless he has EKG changes with TWI in V2-3 , and multiple CRFs.  Noninvasive testing was nondiagnostic.  Invasive testing recommended.  He will see Cardiology on Monday

## 2020-04-03 ENCOUNTER — Ambulatory Visit: Payer: 59 | Admitting: Pharmacist

## 2020-04-03 DIAGNOSIS — IMO0002 Reserved for concepts with insufficient information to code with codable children: Secondary | ICD-10-CM

## 2020-04-03 DIAGNOSIS — E11319 Type 2 diabetes mellitus with unspecified diabetic retinopathy without macular edema: Secondary | ICD-10-CM

## 2020-04-03 NOTE — Chronic Care Management (AMB) (Signed)
Chronic Care Management   Follow Up Note   04/03/2020 Name: Randy Grant MRN: [id number] DOB: 08-Jul-1966  Referred by: No primary care provider on file. Reason for referral : Chronic Care Management (Medication Management)   Randy Grant is a 54 y.o. year old male who is a primary care patient of No primary care provider on file.. The CCM team was consulted for assistance with chronic disease management and care coordination needs.    Review of patient status, including review of consultants reports, relevant laboratory and other test results, and collaboration with appropriate care team members and the patient's provider was performed as part of comprehensive patient evaluation and provision of chronic care management services.    SDOH (Social Determinants of Health) assessments performed: Yes See Care Plan activities for detailed interventions related to SDOH)  SDOH Interventions     Most Recent Value  SDOH Interventions  Financial Strain Interventions Other (Comment)  [medication assistance/access review]       Outpatient Encounter Medications as of 04/03/2020  Medication Sig Note   aspirin 81 MG tablet Take 81 mg by mouth daily.      atorvastatin (LIPITOR) 40 MG tablet Take 1 tablet (40 mg total) by mouth daily.    carvedilol (COREG) 3.125 MG tablet Take 1 tablet (3.125 mg total) by mouth 2 (two) times daily with a meal.    ezetimibe (ZETIA) 10 MG tablet Take 1 tablet (10 mg total) by mouth daily.    fenofibrate 160 MG tablet Take 1 tablet (160 mg total) by mouth at bedtime.    glucose blood (ONE TOUCH ULTRA TEST) test strip Use as instructed three times daily to check blood sugars    Insulin Glargine (BASAGLAR KWIKPEN) 100 UNIT/ML Inject 0.35 mLs (35 Units total) into the skin daily.    insulin lispro (HUMALOG KWIKPEN) 100 UNIT/ML KwikPen Inject 0.1 mLs (10 Units total) into the skin 3 (three) times daily.    Insulin Pen Needle (B-D ULTRAFINE III SHORT PEN)  31G X 8 MM MISC Test blood sugars 2 times daily    meclizine (ANTIVERT) 25 MG tablet Take 1 tablet (25 mg total) by mouth 3 (three) times daily as needed for dizziness. 04/03/2020: Taking BID   metFORMIN (GLUCOPHAGE-XR) 500 MG 24 hr tablet TAKE 2 TABLETS BY MOUTH 2 TIMES DAILY.    Multiple Vitamin (MULTIVITAMIN) tablet Take 1 tablet by mouth daily.      pantoprazole (PROTONIX) 40 MG tablet Take 1 tablet (40 mg total) by mouth daily.    Semaglutide,0.25 or 0.5MG/DOS, (OZEMPIC, 0.25 OR 0.5 MG/DOSE,) 2 MG/1.5ML SOPN Inject 0.375 mLs (0.5 mg total) into the skin once a week. Inject 0.25 mg once weekly for 4 weeks then increase to 0.5 mg weekly    telmisartan (MICARDIS) 40 MG tablet TAKE 1 TABLET (40 MG TOTAL) BY MOUTH DAILY.    UNIFINE PENTIPS 31G X 8 MM MISC TEST BLOOD SUGARS 2 TIMES DAILY    ACCU-CHEK GUIDE test strip USE AS INSTRUCTED    B-D ULTRAFINE III SHORT PEN 31G X 8 MM MISC TEST BLOOD SUGARS 2 TIMES DAILY    BAYER MICROLET LANCETS lancets USE AS DIRECTED    Continuous Blood Gluc Sensor (DEXCOM G6 SENSOR) MISC Use to check blood sugar at least 4 times daily    Continuous Blood Gluc Transmit (DEXCOM G6 TRANSMITTER) MISC Use to check sugar at least 4 times daily    ondansetron (ZOFRAN) 8 MG tablet Take 1 tablet (8 mg total) by  mouth every 8 (eight) hours as needed for up to 14 days for nausea or vomiting. (Patient not taking: Reported on 04/03/2020)    No facility-administered encounter medications on file as of 04/03/2020.     Objective:   Goals Addressed              This Visit's Progress     Patient Stated     PharmD "I want to be healthy" (pt-stated)        CARE PLAN ENTRY (see longitudinal plan of care for additional care plan information)  Current Barriers:   Social, financial, community barriers:  o Does note that insulin costs are adding up.  Diabetes: uncontrolled; complicated by chronic medical conditions including HTN, HLD, most recent A1c 9.3%  Most  recent eGFR: >100 mL/min  Current antihyperglycemic regimen:  metformin XR 1000 mg BID, Basaglar 35 units daily,  Humalog 10 units TID with meals, Ozempic 0.25 mg weekly (started last week) o Hx SGLT2, previously stopped d/t complaints of too frequent polyuria  Cardiovascular risk reduction: follows w/ Dr. Saunders Revel. Saw PA earlier this week, carvedilol initiated.  o Current hypertensive regimen: telmisartan 40 mg daily o Current hyperlipidemia regimen: atorvastatin 40 mg daily, ezetimibe 10 mg daily, fenofibrate 160 mg daily; LDL 131 prior to increasing atorvastatin to 40 mg. TG borderline at 150 o Current antiplatelet regimen: ASA 81 mg daily  Eye exam: up to date  Nephropathy screening: up to date   Pharmacist Clinical Goal(s):   Over the next 90 days, patient will work with PharmD and primary care provider to address optimized medication management  Interventions:  Comprehensive medication review performed, medication list updated in electronic medical record  Inter-disciplinary care team collaboration (see longitudinal plan of care)  Counseled patient on copay for DexCom. ($90 per 90 days for transmitter, $62.47 per 30 days for sensors). Also counseled that [facility], though not covered, would be $75/month. Patient noted he cannot afford either option at this time, but may be able to in the future.   Counseled on goal A1c, goal fasting, and goal 2 hour post prandial. Encouraged continued SMBG checks at least TID, fasting and 2 hour post prandial, to evaluate glucose control.   [place] to review copays. Savings cards are being utilized for [employer], and Humalog, so patient is getting these in the most affordable way.   Patient Self Care Activities:   Patient will check blood glucose at least QID, document, and provide at future appointments  Patient will take medications as prescribed  Patient will report any questions or concerns to provider    Please see past updates related to this goal by clicking on the "Past Updates" button in the selected goal          Plan:  - Scheduled f/u call in ~ 5 weeks  Catie Darnelle Maffucci, PharmD, [place], [place] Pharmacist [place] [phone]

## 2020-04-03 NOTE — Patient Instructions (Addendum)
Mr. Cretella,   It was great talking with you today.   There are two types of Continuous Glucose Monitors: FreeStyle Libre 2 and DexCom G6. The DexCom is "covered" on [employer], but there are two parts to the prescription. The transmitter is $90 per 90 days and the sensors are $62.47 per 30 days. So, this translates into paying a total of $277.41 for a 3 month supply. The FreeStyle Elenor Legato is going to be $75 per month, so a cost of $225 for a 3 month supply. Please let me know if you change your mind and are interested in me sending a prescription for the FreeStyle Libre sensors to the pharmacy. We have samples here at the office and you can try it for 14 days before you pay for it.   For now, continue your current regimen: metformin 1000 mg twice daily, Basaglar 35 units daily, and Humalog 10 units three times daily with meals. Keep taking the Ozempic as prescribed - 0.25 mg weekly for 4 weeks, then 0.5 mg weekly thereafter. Please call me if you start to have low blood sugars as the Ozempic takes more of an effect.   Check your sugar 1) before breakfast and 2) about 2 hours after meals. Write down these readings in the enclosed handouts. We will review at our next appointment  Look forward to talking to you in September!  Catie Darnelle Maffucci, PharmD [phone]   Visit Information  Goals Addressed              This Visit's Progress     Patient Stated   .  PharmD "I want to be healthy" (pt-stated)        CARE PLAN ENTRY (see longitudinal plan of care for additional care plan information)  Current Barriers:  . Social, financial, community barriers:  o Does note that insulin costs are adding up. . Diabetes: uncontrolled; complicated by chronic medical conditions including HTN, HLD, most recent A1c 9.3% . Most recent eGFR: >100 mL/min . Current antihyperglycemic regimen:  metformin XR 1000 mg BID, Basaglar 35 units daily,  Humalog 10 units TID with meals, Ozempic 0.25 mg weekly  (started last week) o Hx SGLT2, previously stopped d/t complaints of too frequent polyuria . Cardiovascular risk reduction: follows w/ Dr. Saunders Revel. Saw PA earlier this week, carvedilol initiated.  o Current hypertensive regimen: telmisartan 40 mg daily o Current hyperlipidemia regimen: atorvastatin 40 mg daily, ezetimibe 10 mg daily, fenofibrate 160 mg daily; LDL 131 prior to increasing atorvastatin to 40 mg. TG borderline at 150 o Current antiplatelet regimen: ASA 81 mg daily . Eye exam: up to date . Nephropathy screening: up to date   Pharmacist Clinical Goal(s):  Marland Kitchen Over the next 90 days, patient will work with PharmD and primary care provider to address optimized medication management  Interventions: . Comprehensive medication review performed, medication list updated in electronic medical record . Inter-disciplinary care team collaboration (see longitudinal plan of care) . Counseled patient on copay for DexCom. ($90 per 90 days for transmitter, $62.47 per 30 days for sensors). Also counseled that [facility], though not covered, would be $75/month. Patient noted he cannot afford either option at this time, but may be able to in the future.  . Counseled on goal A1c, goal fasting, and goal 2 hour post prandial. Encouraged continued SMBG checks at least TID, fasting and 2 hour post prandial, to evaluate glucose control.  . [facility] to review copays. Savings cards are being utilized for [employer],  Basaglar, and Humalog, so patient is getting these in the most affordable way.   Patient Self Care Activities:  . Patient will check blood glucose at least QID, document, and provide at future appointments . Patient will take medications as prescribed . Patient will report any questions or concerns to provider   Please see past updates related to this goal by clicking on the "Past Updates" button in the selected goal         The patient verbalized understanding of instructions  provided today and declined a print copy of patient instruction materials.  Plan:  - Scheduled f/u call in ~ 5 weeks  Catie Darnelle Maffucci, PharmD, [place], [place] Pharmacist [place] [phone]

## 2020-04-04 ENCOUNTER — Telehealth: Payer: Self-pay

## 2020-04-04 NOTE — Telephone Encounter (Signed)
He requested to remain out of work until his vertigo resolved. HAS his vertigo resolved?  And has he seen [facility] [facility] ?

## 2020-04-04 NOTE — Telephone Encounter (Signed)
When can pt return to work?  °

## 2020-04-04 NOTE — Telephone Encounter (Signed)
-----   Message from Grier Mitts sent at 04/04/2020  4:31 PM EDT ----- This patient is requesting a return to work letter called in asking about the

## 2020-04-05 NOTE — Telephone Encounter (Signed)
LMTCB

## 2020-04-05 NOTE — Telephone Encounter (Signed)
Spoke with pt and he stated that he is needing a letter stating that he is okay to work. Pt stated that he returned to work on 04/02/2020 and has had no problems working. Pt also stated that he had an appt with ENT but was not able to see them due to the cost of the appt. He stated that he was going to have to pay full price for the visit up front. Pt stated that he needs letter by today.

## 2020-04-05 NOTE — Telephone Encounter (Signed)
Pt is aware letter is complete and ready for pick up.

## 2020-04-05 NOTE — Telephone Encounter (Signed)
Done, on printer

## 2020-04-07 DIAGNOSIS — H6123 Impacted cerumen, bilateral: Secondary | ICD-10-CM | POA: Diagnosis not present

## 2020-04-08 ENCOUNTER — Ambulatory Visit: Payer: 59 | Admitting: Physician Assistant

## 2020-05-06 ENCOUNTER — Ambulatory Visit: Payer: 59 | Admitting: Pharmacist

## 2020-05-06 DIAGNOSIS — E11319 Type 2 diabetes mellitus with unspecified diabetic retinopathy without macular edema: Secondary | ICD-10-CM

## 2020-05-06 DIAGNOSIS — IMO0002 Reserved for concepts with insufficient information to code with codable children: Secondary | ICD-10-CM

## 2020-05-06 MED ORDER — INSULIN LISPRO (1 UNIT DIAL) 100 UNIT/ML (KWIKPEN): 8.0000 [IU] | PEN_INJECTOR | Freq: Three times a day (TID) | SUBCUTANEOUS | 2 refills | Status: DC

## 2020-05-06 MED ORDER — BASAGLAR KWIKPEN 100 UNIT/ML ~~LOC~~ SOPN: 30.0000 [IU] | PEN_INJECTOR | Freq: Every day | SUBCUTANEOUS | 1 refills | Status: DC

## 2020-05-06 NOTE — Chronic Care Management (AMB) (Signed)
Chronic Care Management   Follow Up Note   05/06/2020 Name: Randy Grant MRN: [id number] DOB: 02/05/1966  Referred by: Crecencio Mc, MD Reason for referral : Chronic Care Management (Medication Management)   Randy Grant is a 54 y.o. year old male who is a primary care patient of Tullo, Aris Everts, MD. The CCM team was consulted for assistance with chronic disease management and care coordination needs.    Contacted patient for medication management review.  Review of patient status, including review of consultants reports, relevant laboratory and other test results, and collaboration with appropriate care team members and the patient's provider was performed as part of comprehensive patient evaluation and provision of chronic care management services.    SDOH (Social Determinants of Health) assessments performed: Yes See Care Plan activities for detailed interventions related to SDOH)  SDOH Interventions     Most Recent Value  SDOH Interventions  Financial Strain Interventions Other (Comment), Intervention Not Indicated       Outpatient Encounter Medications as of 05/06/2020  Medication Sig  . ACCU-CHEK GUIDE test strip USE AS INSTRUCTED  . aspirin 81 MG tablet Take 81 mg by mouth daily.    Marland Kitchen atorvastatin (LIPITOR) 40 MG tablet Take 1 tablet (40 mg total) by mouth daily.  . B-D ULTRAFINE III SHORT PEN 31G X 8 MM MISC TEST BLOOD SUGARS 2 TIMES DAILY  . BAYER MICROLET LANCETS lancets USE AS DIRECTED  . carvedilol (COREG) 3.125 MG tablet Take 1 tablet (3.125 mg total) by mouth 2 (two) times daily with a meal.  . ezetimibe (ZETIA) 10 MG tablet Take 1 tablet (10 mg total) by mouth daily.  . fenofibrate 160 MG tablet Take 1 tablet (160 mg total) by mouth at bedtime.  Marland Kitchen glucose blood (ONE TOUCH ULTRA TEST) test strip Use as instructed three times daily to check blood sugars  . Insulin Glargine (BASAGLAR KWIKPEN) 100 UNIT/ML Inject 0.35 mLs (35 Units total) into the skin  daily.  . insulin lispro (HUMALOG KWIKPEN) 100 UNIT/ML KwikPen Inject 0.1 mLs (10 Units total) into the skin 3 (three) times daily.  . Insulin Pen Needle (B-D ULTRAFINE III SHORT PEN) 31G X 8 MM MISC Test blood sugars 2 times daily  . metFORMIN (GLUCOPHAGE-XR) 500 MG 24 hr tablet TAKE 2 TABLETS BY MOUTH 2 TIMES DAILY.  . Multiple Vitamin (MULTIVITAMIN) tablet Take 1 tablet by mouth daily.    . Semaglutide,0.25 or 0.5MG/DOS, (OZEMPIC, 0.25 OR 0.5 MG/DOSE,) 2 MG/1.5ML SOPN Inject 0.375 mLs (0.5 mg total) into the skin once a week. Inject 0.25 mg once weekly for 4 weeks then increase to 0.5 mg weekly  . telmisartan (MICARDIS) 40 MG tablet TAKE 1 TABLET (40 MG TOTAL) BY MOUTH DAILY.  Marland Kitchen UNIFINE PENTIPS 31G X 8 MM MISC TEST BLOOD SUGARS 2 TIMES DAILY  . pantoprazole (PROTONIX) 40 MG tablet Take 1 tablet (40 mg total) by mouth daily.  . [DISCONTINUED] Continuous Blood Gluc Sensor (DEXCOM G6 SENSOR) MISC Use to check blood sugar at least 4 times daily  . [DISCONTINUED] Continuous Blood Gluc Transmit (DEXCOM G6 TRANSMITTER) MISC Use to check sugar at least 4 times daily   No facility-administered encounter medications on file as of 05/06/2020.     Objective:   Goals Addressed              This Visit's Progress     Patient Stated   .  PharmD "I want to be healthy" (pt-stated)  CARE PLAN ENTRY (see longitudinal plan of care for additional care plan information)  Current Barriers:  . Social, financial, community barriers:  o None noted at this time.  . Diabetes: uncontrolled; complicated by chronic medical conditions including HTN, HLD, most recent A1c 9.3% . Most recent eGFR: >100 mL/min . Current antihyperglycemic regimen: metformin XR 1000 mg BID, Basaglar 35 units daily, Humalog 10 units TID with meals, Ozempic 0.5 mg (x 1 dose) o Hx SGLT2, previously stopped d/t complaints of too frequent polyuria . Does note 1 episode of hypoglycemia to 53. He gave himself a dose of Humalog but  got busy and didn't eat . Current glucose readings: (had discussed CGM, but was too costly at this time) o Fastings: 70-80s o Post prandial: 90-120s . Cardiovascular risk reduction: follows w/ Dr. Saunders Revel.  o Current hypertensive regimen: telmisartan 40 mg daily, carvedilol 3.125 mg BID o Current hyperlipidemia regimen: atorvastatin 40 mg daily, ezetimibe 10 mg daily, fenofibrate 160 mg daily; LDL 131 prior to increasing atorvastatin to 40 mg. TG borderline at 150 o Current antiplatelet regimen: ASA 81 mg daily . Eye exam: up to date . Nephropathy screening: up to date   Pharmacist Clinical Goal(s):  Marland Kitchen Over the next 90 days, patient will work with PharmD and primary care provider to address optimized medication management  Interventions: . Comprehensive medication review performed, medication list updated in electronic medical record . Inter-disciplinary care team collaboration (see longitudinal plan of care) . Reviewed glucose readings. To reduce risk of hypoglycemia (especially given recent dose increase in Ozempic), decrease Basaglar to 30 units daily and decrease Humalog to 8 units TID with meals. Continue metformin 1000 mg BID and Ozempic 0.5 mg weekly. Moving forward, goal to reduce insulin burden to improve safety of regimen. Can increase Ozempic to max 1 mg dose as tolerated.   Patient Self Care Activities:  . Patient will check blood glucose at least QID, document, and provide at future appointments . Patient will take medications as prescribed . Patient will report any questions or concerns to provider   Please see past updates related to this goal by clicking on the "Past Updates" button in the selected goal          Plan:  - Scheduled f/u call in ~ 4-5 weeks  Catie Darnelle Maffucci, PharmD, [place], [place] Pharmacist [place] [phone]

## 2020-05-06 NOTE — Patient Instructions (Signed)
Randy Grant,   It was great to talk to you today! Keep up the Randy Grant!  Since you've increased the Ozempic, I worry you are more likely to have low blood sugars. Decrease Basaglar to 30 units daily and decrease Humalog to 8 units up to three times daily with meals.   Moving forward, our goal will be to eliminate Humalog. This will make your regimen much safer!  As always, call with any questions or concerns.   Catie Darnelle Maffucci, PharmD [phone]  Visit Information  Goals Addressed              This Visit's Progress     Patient Stated   .  PharmD "I want to be healthy" (pt-stated)        CARE PLAN ENTRY (see longitudinal plan of care for additional care plan information)  Current Barriers:  . Social, financial, community barriers:  o None noted at this time.  . Diabetes: uncontrolled; complicated by chronic medical conditions including HTN, HLD, most recent A1c 9.3% . Most recent eGFR: >100 mL/min . Current antihyperglycemic regimen: metformin XR 1000 mg BID, Basaglar 35 units daily, Humalog 10 units TID with meals, Ozempic 0.5 mg (x 1 dose) o Hx SGLT2, previously stopped d/t complaints of too frequent polyuria . Does note 1 episode of hypoglycemia to 55. He gave himself a dose of Humalog but got busy and didn't eat . Current glucose readings: (had discussed CGM, but was too costly at this time) o Fastings: 70-80s o Post prandial: 90-120s . Cardiovascular risk reduction: follows w/ Dr. Saunders Revel.  o Current hypertensive regimen: telmisartan 40 mg daily, carvedilol 3.125 mg BID o Current hyperlipidemia regimen: atorvastatin 40 mg daily, ezetimibe 10 mg daily, fenofibrate 160 mg daily; LDL 131 prior to increasing atorvastatin to 40 mg. TG borderline at 150 o Current antiplatelet regimen: ASA 81 mg daily . Eye exam: up to date . Nephropathy screening: up to date   Pharmacist Clinical Goal(s):  Marland Kitchen Over the next 90 days, patient will work with PharmD and primary care provider to  address optimized medication management  Interventions: . Comprehensive medication review performed, medication list updated in electronic medical record . Inter-disciplinary care team collaboration (see longitudinal plan of care) . Reviewed glucose readings. To reduce risk of hypoglycemia (especially given recent dose increase in Ozempic), decrease Basaglar to 30 units daily and decrease Humalog to 8 units TID with meals. Continue metformin 1000 mg BID and Ozempic 0.5 mg weekly. Moving forward, goal to reduce insulin burden to improve safety of regimen. Can increase Ozempic to max 1 mg dose as tolerated.   Patient Self Care Activities:  . Patient will check blood glucose at least QID, document, and provide at future appointments . Patient will take medications as prescribed . Patient will report any questions or concerns to provider   Please see past updates related to this goal by clicking on the "Past Updates" button in the selected goal         The patient verbalized understanding of instructions provided today and declined a print copy of patient instruction materials.    Plan:  - Scheduled f/u call in ~ 4-5 weeks  Catie Darnelle Maffucci, PharmD, [place], [place] Pharmacist [place] [phone]

## 2020-05-21 ENCOUNTER — Other Ambulatory Visit: Payer: Self-pay | Admitting: Internal Medicine

## 2020-05-21 DIAGNOSIS — IMO0002 Reserved for concepts with insufficient information to code with codable children: Secondary | ICD-10-CM

## 2020-05-21 DIAGNOSIS — E11319 Type 2 diabetes mellitus with unspecified diabetic retinopathy without macular edema: Secondary | ICD-10-CM

## 2020-06-13 ENCOUNTER — Telehealth: Payer: 59

## 2020-06-14 ENCOUNTER — Telehealth: Payer: Self-pay | Admitting: Pharmacist

## 2020-06-14 ENCOUNTER — Telehealth: Payer: 59

## 2020-06-14 NOTE — Progress Notes (Signed)
°  Chronic Care Management   Note  06/14/2020 Name: Randy Grant MRN: [id number] DOB: 11-03-1965   Attempted to contact patient for scheduled appointment for medication management support. Left HIPAA compliant message for patient to return my call at their convenience.    Plan: - If I do not hear back from the patient by end of business today, will collaborate with Care Guide to outreach to schedule follow up with me   Catie Feliz Beam, PharmD, [place], CPP Clinical Pharmacist [facility] [employer] [phone]

## 2020-06-21 ENCOUNTER — Telehealth: Payer: Self-pay

## 2020-06-21 NOTE — Chronic Care Management (AMB) (Signed)
  Care Management   Note  06/21/2020 Name: Randy Grant MRN: [id number] DOB: Sep 29, 1965  [name] is a 54 y.o. year old male who is a primary care patient of Darrick Huntsman, Mar Daring, MD and is actively engaged with the care management team. I reached out to Durward E Barro by phone today to assist with re-scheduling a follow up visit with the Pharmacist  Follow up plan: Unsuccessful telephone outreach attempt made. A HIPAA compliant phone message was left for the patient providing contact information and requesting a return call.  The care management team will reach out to the patient again over the next 7 days.  If patient returns call to provider office, please advise to call Embedded Care Management Care Guide Penne Lash  at [phone]  Penne Lash, RMA Care Guide, Embedded Care Coordination [facility]  [place], [place] [address] Direct Dial: [phone] [email or web address]: [email or web address]

## 2020-06-26 NOTE — Chronic Care Management (AMB) (Signed)
  Care Management   Note  06/26/2020 Name: CYRUS RAMSBURG MRN: [id number] DOB: 1965/08/28  [name] is a 54 y.o. year old male who is a primary care patient of Darrick Huntsman, Mar Daring, MD and is actively engaged with the care management team. I reached out to Jatavian E Faughnan by phone today to assist with re-scheduling a follow up visit with the Pharmacist  Follow up plan: Unsuccessful telephone outreach attempt made. A HIPAA compliant phone message was left for the patient providing contact information and requesting a return call.  The care management team will reach out to the patient again over the next 7 days.  If patient returns call to provider office, please advise to call Embedded Care Management Care Guide Penne Lash  at [phone]  Penne Lash, RMA Care Guide, Embedded Care Coordination [facility]  [place], [place] [address] Direct Dial: [phone] [email or web address]: [email or web address]

## 2020-06-30 NOTE — Progress Notes (Deleted)
Office Visit    Patient Name: [name] Date of Encounter: 06/30/2020  Primary Care Provider:  Sherlene Shams, MD Primary Cardiologist:  Yvonne Kendall, MD  Chief Complaint    No chief complaint on file.   54 year old male with history of exertional tachycardia and shortness of breath, bifascicular block noted 02/2020, hypertension, hyperlipidemia, poorly controlled DM2, previous smoker (states quit 15-20 years ago), GERD on PPI, dizziness on meclizine, and recent admission for emesis and dizziness with stress testing performed and low risk, and seen for hospital follow-up.  Past Medical History    Past Medical History:  Diagnosis Date  . Cataract 2017   left  . Diabetes mellitus   . GERD (gastroesophageal reflux disease)   . Hyperlipidemia   . Hypertension    No past surgical history on file.  Allergies  No Known Allergies  History of Present Illness    Randy Grant is a 54 y.o. male with PMH as above.   He was initially referred to [facility] cardiology at the request of Dr. Darrick Huntsman for exertional tachycardia and shortness of breath.  He has a history of hypertension, hyperlipidemia, DM2, and GERD.  He reported when seen in clinic 02/23/2020 that his symptoms had started 2 weeks prior and seemed to be improving.  He reported faster heart rate and pumping when exerting himself.  No chest pain, shortness of breath, lightheadedness, or edema.  Exertional dyspnea had previously been noted by his PCP, though he denied this at his visit.  No reported history of prior cardiac disease or testing.  He had longstanding heartburn.  He wondered if his elevated rates could be due to sleep deprivation, as he was only sleeping 4 to 5 hours a day when his symptoms began.  EKG showed bifascicular block of uncertain chronicity.  Given his risk factors, it was noted that we cannot completely rule out ischemic heart disease.  Echo and Lexiscan were recommended. He was continued  on atorvastatin, though future escalation was noted as possible.  BP was elevated with better readings at home and medication changes deferred.  It was noted his A1c was elevated and he should follow-up with Dr. Darrick Huntsman.  Stress test and echo were ordered; however, he missed his appointment for stress test.  Echo showed normal pump function 60 to 65%, G1DD, and no valvular dz.   He was admitted 03/22/2020 with cardiology consulted.  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  He reported epigastric pain with 4 episodes of emesis. It was documented he had an episode of emesis in the ER.  He received a GI cocktail with no concerning changes noted on EKG per cardiology consultation and NSR with bifascicular block. He reported significant dizziness and unsteadiness on his feet. It was noted that he had symptoms of dizziness, concerning for vertigo.  The symptoms worsened after several episodes of vomiting. In response, meclizine was started.  He was started on Protonix as it was suspected his sx of emesis were likely 2/2 GERD with pt report that he eats a lot of spicy foods. Stress test was performed without ischemia and ruled a low risk study with normal ejection fraction.  During his admission, labs run and showed LDL 131 with total cholesterol 211. Renal function was stable with Cr 0.74 and BUN 11. K 3.7.  Today, he returns to clinic and notes improvement in his dizziness since starting on  meclizine.  He has an upcoming appointment to speak with ENT regarding his unsteadiness on his feet.  Specifically, he notes that he is not able to stand on one leg at a time.  He otherwise denies further dizziness. Sx of emesis and nausea improved with PPI and dizziness improved with meclizine. He denies any chest pain, racing heart rate, or palpitations. He reports improvement in his previously reported dyspnea/pounding heart with exertion ever since he saw Dr. Okey Dupre  on 02/23/2020.  He attributes his most recent episode of epigastric symptoms to eating spicy food, as outlined above, and has been attempting to eat less spicy foods.  He reports his PCP reviewed his admission and has recommended catheterization. On review of EMR, this is 2/2 the TWI noted in V1/2 with repolarization abnormality (located this EKG under 8/12 at 20:29).  His blood pressure is mildly elevated today. He states he is no longer taking HCTZ, given he was told to stop it given a side effect that was potentially dangerous.  He reports he used to really like the medication and was upset to come off of it but advised to throw out the medication. On review of discharge paperwork, his HCTZ is still listed. On review of most recent PCP visit, his HCTZ is still listed. ?Unclear if he was told that, given his emesis, there may be dehydration concerns. Today, he reports he stays very well hydrated; however, his resting HR is 96 and at time escalates into the 110s during our visit. Will need to clarify this with his PCP. On review of EMR, I am unable to find the reason this medication was discontinued. He does not have any new allergies reported; however, given his report today, I am hesitant to restart HCTZ without first clarifying the reason it was discontinued. He otherwise reports medication compliance.  No signs or symptoms consistent with bleeding.  Further recommendations as below.  Home Medications    Prior to Admission medications   Medication Sig Start Date End Date Taking? Authorizing Provider  ACCU-CHEK GUIDE test strip USE AS INSTRUCTED 08/01/19  Yes Sherlene Shams, MD  aspirin 81 MG tablet Take 81 mg by mouth daily.     Yes [provider]  atorvastatin (LIPITOR) 40 MG tablet Take 1 tablet (40 mg total) by mouth daily. 03/29/20  Yes Sherlene Shams, MD  B-D ULTRAFINE III SHORT PEN 31G X 8 MM MISC TEST BLOOD SUGARS 2 TIMES DAILY 10/11/17  Yes Sherlene Shams, MD  BAYER MICROLET LANCETS  lancets USE AS DIRECTED 11/20/16  Yes Sherlene Shams, MD  Continuous Blood Gluc Sensor (DEXCOM G6 SENSOR) MISC Use to check blood sugar at least 4 times daily 03/29/20  Yes Sherlene Shams, MD  Continuous Blood Gluc Transmit (DEXCOM G6 TRANSMITTER) MISC Use to check sugar at least 4 times daily 03/29/20  Yes Sherlene Shams, MD  ezetimibe (ZETIA) 10 MG tablet Take 1 tablet (10 mg total) by mouth daily. 12/28/19  Yes Sherlene Shams, MD  fenofibrate 160 MG tablet Take 1 tablet (160 mg total) by mouth at bedtime. 12/28/19  Yes Sherlene Shams, MD  glucose blood (ONE TOUCH ULTRA TEST) test strip Use as instructed three times daily to check blood sugars 09/11/13  Yes Sherlene Shams, MD  hydrochlorothiazide (HYDRODIURIL) 25 MG tablet Take 1 tablet (25 mg total) by mouth daily. 09/15/19  Yes Sherlene Shams, MD  Insulin Glargine (BASAGLAR KWIKPEN) 100 UNIT/ML Inject 0.35 mLs (35 Units total)  into the skin daily. 03/29/20  Yes Sherlene Shamsullo, Teresa L, MD  insulin lispro (HUMALOG KWIKPEN) 100 UNIT/ML KwikPen Inject 0.1 mLs (10 Units total) into the skin 3 (three) times daily. 03/29/20  Yes Sherlene Shamsullo, Teresa L, MD  Insulin Pen Needle (B-D ULTRAFINE III SHORT PEN) 31G X 8 MM MISC Test blood sugars 2 times daily 01/17/14  Yes Sherlene Shamsullo, Teresa L, MD  meclizine (ANTIVERT) 25 MG tablet Take 1 tablet (25 mg total) by mouth 3 (three) times daily as needed for dizziness. 03/23/20 04/22/20 Yes Charise KillianWilliams, Jamiese M, MD  metFORMIN (GLUCOPHAGE-XR) 500 MG 24 hr tablet TAKE 2 TABLETS BY MOUTH 2 TIMES DAILY. 12/11/19  Yes Sherlene Shamsullo, Teresa L, MD  Multiple Vitamin (MULTIVITAMIN) tablet Take 1 tablet by mouth daily.     Yes [provider]  ondansetron (ZOFRAN) 8 MG tablet Take 1 tablet (8 mg total) by mouth every 8 (eight) hours as needed for up to 14 days for nausea or vomiting. 03/23/20 04/06/20 Yes Charise KillianWilliams, Jamiese M, MD  pantoprazole (PROTONIX) 40 MG tablet Take 1 tablet (40 mg total) by mouth daily. 03/23/20 04/22/20 Yes Charise KillianWilliams, Jamiese M, MD   Semaglutide,0.25 or 0.5MG /DOS, (OZEMPIC, 0.25 OR 0.5 MG/DOSE,) 2 MG/1.5ML SOPN Inject 0.375 mLs (0.5 mg total) into the skin once a week. Inject 0.25 mg once weekly for 4 weeks then increase to 0.5 mg weekly 03/29/20  Yes Sherlene Shamsullo, Teresa L, MD  telmisartan (MICARDIS) 40 MG tablet TAKE 1 TABLET (40 MG TOTAL) BY MOUTH DAILY. 03/19/20  Yes Sherlene Shamsullo, Teresa L, MD  UNIFINE PENTIPS 31G X 8 MM MISC TEST BLOOD SUGARS 2 TIMES DAILY 12/11/19  Yes Sherlene Shamsullo, Teresa L, MD    Review of Systems    He denies chest pain, palpitations, dyspnea, pnd, orthopnea, further n, further v, syncope, edema, weight gain, or early satiety.  He denies further episodes of emesis like the one that sent him to the ED and since starting his PPI.  He reports ongoing dizziness and unsteadiness when standing on one leg at a time.  His dizziness has improved with meclizine..   All other systems reviewed and are otherwise negative except as noted above.  Physical Exam    VS:  There were no vitals taken for this visit. , BMI There is no height or weight on file to calculate BMI. GEN: Well nourished, well developed, in no acute distress. HEENT: normal. Neck: Supple, no JVD, carotid bruits, or masses. Cardiac: tachycardic but regular, no murmurs, rubs, or gallops. No clubbing, cyanosis, edema.  Radials/DP/PT 2+ and equal bilaterally.  Respiratory:  Respirations regular and unlabored, clear to auscultation bilaterally. GI: Soft, nontender, nondistended, BS + x 4. MS: no deformity or atrophy. Skin: warm and dry, no rash. Neuro:  Strength and sensation are intact. Psych: Normal affect.  Accessory Clinical Findings    ECG personally reviewed by me today - NSR, 93bpm, QRS 110ms, bifascicular block, incomplete RBBB, LAFB - no acute changes.  VITALS Reviewed today   Temp Readings from Last 3 Encounters:  03/29/20 98.7 F (37.1 C) (Oral)  03/23/20 98 F (36.7 C) (Oral)  01/29/20 (!) 97.3 F (36.3 C)   BP Readings from Last 3 Encounters:   04/01/20 140/70  03/29/20 (!) 150/82  03/23/20 (!) 107/51   Pulse Readings from Last 3 Encounters:  04/01/20 96  03/29/20 96  03/23/20 60    Wt Readings from Last 3 Encounters:  04/01/20 161 lb 8 oz (73.3 kg)  03/29/20 164 lb 6.4 oz (74.6 kg)  03/23/20  157 lb 9.6 oz (71.5 kg)     LABS  reviewed today   Lab Results  Component Value Date   WBC 5.1 03/23/2020   HGB 13.4 03/23/2020   HCT 41.2 03/23/2020   MCV 82.9 03/23/2020   PLT 316 03/23/2020   Lab Results  Component Value Date   CREATININE 0.74 03/21/2020   BUN 11 03/21/2020   NA 141 03/21/2020   K 3.7 03/21/2020   CL 102 03/21/2020   CO2 26 03/21/2020   Lab Results  Component Value Date   ALT 16 01/29/2020   AST 13 01/29/2020   ALKPHOS 57 01/29/2020   BILITOT 0.5 01/29/2020   Lab Results  Component Value Date   CHOL 211 (H) 03/21/2020   HDL 50 03/21/2020   LDLCALC 131 (H) 03/21/2020   LDLDIRECT 130.0 01/01/2020   TRIG 150 (H) 03/21/2020   CHOLHDL 4.2 03/21/2020    Lab Results  Component Value Date   HGBA1C 9.3 (H) 01/01/2020   Lab Results  Component Value Date   TSH 1.16 03/29/2017     STUDIES/PROCEDURES reviewed today   Echo 03/22/20 1. Left ventricular ejection fraction, by estimation, is 60 to 65%. The  left ventricle has normal function. The left ventricle has no regional  wall motion abnormalities. Left ventricular diastolic parameters are  consistent with Grade I diastolic  dysfunction (impaired relaxation).  2. Right ventricular systolic function is normal. The right ventricular  size is normal. Tricuspid regurgitation signal is inadequate for assessing  PA pressure.   NM Study Pharmacological myocardial perfusion imaging study with no significant  ischemia Normal wall motion, EF estimated at 65% No EKG changes concerning for ischemia at peak stress or in recovery. Low risk scan   Assessment & Plan    Tachycardia Bifascicular block --Reportedly asx. Denies racing HR or  palpitations and reports resolved DOE. He does not feel that his previous / improving dizziness is connected to elevated HR. He describes it today only as unsteadiness.  --Given his risk factors for CAD with bifascicular block of unknown chronicity, he underwent further ischemic workup with stress testing and echo that were both unrevealing.  --Recently, he was admitted with dizziness. It is noted on review of this admission that his rate was well controlled while admitted. Today, his rate increases into the 110s while speaking.  --TSH wnl. 8/12 K noted to be 3.7. Preference to seen ENT before repeating a BMET, given his improved sx. --Given his variable rates and pressures noted over the last couple of months, as well as his bifascicular block, I discussed my concern that he may have an arrhythmia not captured yet on EKG or telemetry. We discussed my concern that his earlier pre-admission dizziness and emesis could be due to arrhythmia / tachycardic rates, possibly even triggered by dehydration due to his emesis. --Recommendation was for Zio, which could also help with determining if rate control should be initiated based on what is seen on monitoring. Patient preference was to defer.  He prefers to first follow-up with ENT.  --Given his rates today, started on low dose Coreg 3.125mg  BID. Informed pt that this is not ideal, given his bifascicular block and variable rates on admission; therefore, I will reach out to his primary cardiologist with further feedback regarding initiation of this medication at that time if provided. He is aware he may receive a phone call in the next couple of days. Recommend control BP and DM2, as these could certainly exacerbate the above.  HTN, sub-optimal --BP today elevated, though it was well controlled during his admission on HCTZ. He reports that, since his admission, he has thrown out HCTZ. He stated his PCP said he should not take it 2/2 "dangerous side effects," which  will need clarified before we can restart this medication. He was unable to clarify if he had the dangerous side effects or was told about them. Will reach out to Dr. Darrick Huntsman. Continue current medications. As above, will reach out to Dr. Okey Dupre regarding Coreg. Continue ARB with further recommendations regarding Coreg and HCTZ pending discussion with care team. Low salt diet. Recommend adequate hydration though fluid intake under 2L total daily.   DOE, resolved --He reports resolution in his DOE since seeing Dr. Okey Dupre 02/23/2020. He is uncertain the reason for this improvement; however he states it has not recurred.   Dizziness --Reports improvement since admission and attributed to meclizine. Unclear etiology. Consider that his dizziness could in part be due to his tachycardia. Other etiologies include dehydration +/- electrolyte abnormality and subsequent arrhythmia. Also considered is vasovagal etiology, given his emesis. Offered a Zio monitor with pt preference to decline until after upcoming ENT visit. Reassess at RTC.   ?HCTZ allergy?  He reports he is no longer on HCTZ for unclear reasons. Unfortunately, given his report of concerning side effects, we will need to clarify the reason he threw out this medication before restarting it. Given his recent emesis, this may have been discontinued until resolution of his GI sx; however, this is unclear.  Review of cardiac workup --He reports concern that he may need a cardiac catheterization, as advised by his PCP. Reviewed his low risk stress test and echo with nl EF and NRWMA. He does have cardiac risk factors including hypertension, hyperlipidemia, DM2, male gender, and prior tobacco use.  Discussed need to control his risk factors, such as his LDL, DM2, BP, and HR. Zio monitoring recommended, given his tachycardic rates today, and as BB is not ideal with his bifascicular block. Pt declined. Will start low dose Coreg 3.125mg  BID and reach out to Dr. Okey Dupre. It was  discussed that this is not the ideal medication, given his bifascicular block; therefore, will need to discuss with MD as well. Further recommendations at that time.  DM2 --Glycemic control recommended. A1C 9.3. Also consider this as possible etiology of his nausea and dizziness, as well as electrolyte abnormalities, which was discussed with the pt.   HLD --Continue statin, Zetia, fenofibrate.   Medication changes: ?Coreg 3.125mg  BID (pending confirmation with Dr. Okey Dupre). Need to clarify if HCTZ allergy and why he was advised to throw away. Labs ordered: None Studies / Imaging ordered: None Future considerations: Endocrinology referral, Zio, carotids (if ongoing dizziness) Disposition: RTC 3 months or sooner    Lennon Alstrom, PA-C 06/30/2020

## 2020-07-01 ENCOUNTER — Ambulatory Visit: Payer: 59 | Admitting: Physician Assistant

## 2020-07-01 NOTE — Chronic Care Management (AMB) (Signed)
  Care Management   Note  07/01/2020 Name: Randy Grant MRN: [id number] DOB: Aug 10, 1966  Randy Grant is a 54 y.o. year old male who is a primary care patient of Tullo, Mar Daring, MD and is actively engaged with the care management team. I reached out to Randy Grant by phone today to assist with re-scheduling a follow up visit with the Pharmacist  Follow up plan: Telephone appointment with care management team member scheduled for:07/29/2020  Penne Lash, RMA Care Guide, Embedded Care Coordination [facility]  [place], [place] [address] Direct Dial: [phone] [email or web address]

## 2020-07-01 NOTE — Telephone Encounter (Signed)
Pt has been r/s  

## 2020-07-09 ENCOUNTER — Other Ambulatory Visit: Payer: Self-pay | Admitting: Internal Medicine

## 2020-07-10 ENCOUNTER — Other Ambulatory Visit: Payer: Self-pay

## 2020-07-10 ENCOUNTER — Other Ambulatory Visit: Payer: Self-pay | Admitting: Physician Assistant

## 2020-07-10 MED ORDER — CARVEDILOL 3.125 MG PO TABS
3.1250 mg | ORAL_TABLET | Freq: Two times a day (BID) | ORAL | 0 refills | Status: DC
Start: 2020-07-10 — End: 2020-11-08

## 2020-07-29 ENCOUNTER — Ambulatory Visit: Payer: 59 | Admitting: Pharmacist

## 2020-07-29 ENCOUNTER — Other Ambulatory Visit: Payer: Self-pay | Admitting: Internal Medicine

## 2020-07-29 DIAGNOSIS — E782 Mixed hyperlipidemia: Secondary | ICD-10-CM

## 2020-07-29 DIAGNOSIS — IMO0002 Reserved for concepts with insufficient information to code with codable children: Secondary | ICD-10-CM

## 2020-07-29 DIAGNOSIS — E11319 Type 2 diabetes mellitus with unspecified diabetic retinopathy without macular edema: Secondary | ICD-10-CM

## 2020-07-29 DIAGNOSIS — I10 Essential (primary) hypertension: Secondary | ICD-10-CM

## 2020-07-29 MED ORDER — TELMISARTAN 40 MG PO TABS
40.0000 mg | ORAL_TABLET | Freq: Every day | ORAL | 1 refills | Status: DC
Start: 2020-07-29 — End: 2020-07-29

## 2020-07-29 MED ORDER — FENOFIBRATE 160 MG PO TABS
160.0000 mg | ORAL_TABLET | Freq: Every day | ORAL | 1 refills | Status: DC
Start: 2020-07-29 — End: 2020-07-29

## 2020-07-29 NOTE — Patient Instructions (Signed)
Visit Information  Patient Care Plan: Medication Management    Problem Identified: Diabetes, HLD     Long-Range Goal: Disease Progression Prevention   This Visit's Progress: On track  Priority: High  Note:   Current Barriers:  . Unable to achieve control of diabetes   Pharmacist Clinical Goal(s):  Marland Kitchen Over the next 90 days, patient will achieve control of diabetes as evidenced by A1c  through collaboration with PharmD and provider.  Interventions: . 1:1 collaboration with Sherlene Shams, MD regarding development and update of comprehensive plan of care as evidenced by provider attestation and co-signature . Inter-disciplinary care team collaboration (see longitudinal plan of care) . Comprehensive medication review performed; medication list updated in electronic medical record  Diabetes: . Uncontrolled; current treatment: metformin 1000 mg BID, Ozempic 0.5 mg weekly, Basaglar 30 units daily, Humalog 10 units TID with meals,   . Current glucose readings: fasting glucose: 80-90s, post prandial glucose: 170-180s, occasional 200+ excursions . Recommended to continue current regimen at this time . Overdue for lab work and PCP follow up. Will collaborate w/ office staff to schedule appt.  . If next A1c not at goal, recommend increasing Ozempic to 1 mg weekly. Would reduce Basaglar dose to reduce risk of hypoglycemia if increasing Ozempic.   Hypertension: . Controlled; current treatment: telmisartan 40 mg daily, carvedilol 3.125 mg BID; overdue for f/u with cardiology . Recommended to continue current treatment at this time. He requested refill on telmisartan. Encouraged to f/u with PCP and cardiology  Hyperlipidemia and ASCVD risk reduction: . Uncontrolled but likely improved; current treatment: atorvastatin 40 mg daily, ezetimibe 10 mg daily, fenofibrate 160 mg daily; LDL 131 prior to increasing atorvastatin to 40 mg. TG borderline at 150 . Antiplatelet regimen: aspirin 81 mg daily  . Due  for lipid panel w/ PCP appt. Continue current regimen at this time. He requested refill on fenofibrate.  Patient Goals/Self-Care Activities . Over the next 90 days, patient will:  - take medications as prescribed check glucose BID, document, and provide at future appointments collaborate with provider on medication access solutions  Follow Up Plan: Telephone follow up appointment with care management team member scheduled for: ~  6 weeks      The patient verbalized understanding of instructions, educational materials, and care plan provided today and declined offer to receive copy of patient instructions, educational materials, and care plan.    Plan: Telephone follow up appointment with care management team member scheduled for: ~ 6 weeks  Catie Feliz Beam, PharmD, [place], CPP Clinical Pharmacist [facility] [phone]

## 2020-07-29 NOTE — Chronic Care Management (AMB) (Signed)
Care Management   Pharmacy Note  07/29/2020 Name: Randy Grant MRN: [id number] DOB: July 12, 1966  Randy Grant is a 54 y.o. year old male who is a primary care patient of Randy Shams, MD. The Care Management/Care Coordination team team was consulted for assistance with care management and care coordination needs.    Engaged with patient by telephone for follow up visit in response to provider referral for pharmacy case management and/or care coordination services.   Consent to Services:  Randy Grant was given information about care management/care coordination services, agreed to services, and gave verbal consent prior to initiation of services. Please see initial visit note for detailed documentation.   Review of patient status, including review of consultants reports, laboratory and other test data, was performed as part of comprehensive evaluation and provision of chronic care management services.   SDOH (Social Determinants of Health) assessments and interventions performed:  SDOH Interventions   Flowsheet Row Most Recent Value  SDOH Interventions   Financial Strain Interventions Intervention Not Indicated       Objective:  Lab Results  Component Value Date   CREATININE 0.74 03/21/2020   CREATININE 0.80 01/29/2020   CREATININE 0.76 01/01/2020    Lab Results  Component Value Date   HGBA1C 9.3 (H) 01/01/2020       Component Value Date/Time   CHOL 211 (H) 03/21/2020 2024   TRIG 150 (H) 03/21/2020 2024   HDL 50 03/21/2020 2024   CHOLHDL 4.2 03/21/2020 2024   VLDL 30 03/21/2020 2024   LDLCALC 131 (H) 03/21/2020 2024   LDLDIRECT 130.0 01/01/2020 1040    Clinical ASCVD: No  The 10-year ASCVD risk score Denman George DC Jr., et al., 2013) is: 16%   Values used to calculate the score:     Age: 89 years     Sex: Male     Is Non-Hispanic African American: No     Diabetic: Yes     Tobacco smoker: No     Systolic Blood Pressure: 138 mmHg     Is BP treated:  Yes     HDL Cholesterol: 50 mg/dL     Total Cholesterol: 211 mg/dL     BP Readings from Last 3 Encounters:  04/01/20 140/70  03/29/20 (!) 150/82  03/23/20 (!) 107/51    Care Plan  No Known Allergies  Medications Reviewed Today    Reviewed by Randy Grant, RPH-CPP (Pharmacist) on 07/29/20 at 1440  Med List Status: <None>  Medication Order Taking? Sig Documenting Provider Last Dose Status Informant  ACCU-CHEK GUIDE test strip [id number]  USE AS INSTRUCTED Randy Shams, MD  Active Self  aspirin 81 MG tablet [id number] Yes Take 81 mg by mouth daily. [provider] Taking Active Self  atorvastatin (LIPITOR) 40 MG tablet [id number] Yes Take 1 tablet (40 mg total) by mouth daily. Randy Shams, MD Taking Active   B-Grant ULTRAFINE III SHORT PEN 31G X 8 MM MISC [id number] Yes TEST BLOOD SUGARS 2 TIMES DAILY Randy Shams, MD Taking Active Self  BAYER MICROLET LANCETS lancets [id number] Yes USE AS DIRECTED Randy Shams, MD Taking Active Self  carvedilol (COREG) 3.125 MG tablet [id number] Yes Take 1 tablet (3.125 mg total) by mouth 2 (two) times daily with a meal. Please schedule office visit for further refills. Thank you! Randy Grant D, PA-C Taking Active   ezetimibe (ZETIA) 10 MG tablet [id number] Yes Take 1 tablet (10 mg total) by mouth daily. Randy Shams, MD  Taking Active Self  fenofibrate 160 MG tablet [id number] Yes Take 1 tablet (160 mg total) by mouth at bedtime. Randy Shams, MD Taking Active Self  glucose blood (ONE TOUCH ULTRA TEST) test strip [id number] Yes Use as instructed three times daily to check blood sugars Randy Shams, MD Taking Active Self  Insulin Glargine [facility] KWIKPEN) 100 UNIT/ML [id number] Yes INJECT 45 UNITS INTO THE SKIN DAILY. Randy Shams, MD Taking Active            Med Note Ara Kussmaul Jul 29, 2020  2:36 PM) 30 units   insulin lispro (HUMALOG KWIKPEN) 100 UNIT/ML KwikPen [id number] Yes Inject 8 Units into the skin  3 (three) times daily. Randy Shams, MD Taking Active   Insulin Pen Needle (B-Grant ULTRAFINE III SHORT PEN) 31G X 8 MM MISC [id number] Yes Test blood sugars 2 times daily Randy Shams, MD Taking Active Self  metFORMIN (GLUCOPHAGE-XR) 500 MG 24 hr tablet [id number] Yes TAKE 2 TABLETS BY MOUTH 2 TIMES DAILY. Randy Shams, MD Taking Active   Multiple Vitamin (MULTIVITAMIN) tablet [id number] Yes Take 1 tablet by mouth daily. [provider] Taking Active Self  pantoprazole (PROTONIX) 40 MG tablet [id number] Yes Take 1 tablet (40 mg total) by mouth daily. Randy Killian, MD Taking Expired 04/22/20 2359   Semaglutide,0.25 or 0.5MG /DOS, (OZEMPIC, 0.25 OR 0.5 MG/DOSE,) 2 MG/1.5ML SOPN [id number] Yes Inject 0.375 mLs (0.5 mg total) into the skin once a week. Inject 0.25 mg once weekly for 4 weeks then increase to 0.5 mg weekly Randy Shams, MD Taking Active   telmisartan (MICARDIS) 40 MG tablet [id number] Yes Take 1 tablet (40 mg total) by mouth daily. Randy Shams, MD Taking Active   UNIFINE PENTIPS 31G X 8 MM MISC [id number] Yes TEST BLOOD SUGARS 2 TIMES DAILY Randy Shams, MD Taking Active Self          Patient Active Problem List   Diagnosis Date Noted  . Vertigo 03/29/2020  . Hospital discharge follow-up 03/29/2020  . Chest pain 03/21/2020  . Tachycardia 02/24/2020  . Abnormal electrocardiogram 02/24/2020  . Uncontrolled type 2 diabetes mellitus with hyperglycemia (HCC) 02/24/2020  . Neuropathy associated with endocrine disorder (HCC) 01/29/2020  . Dyspnea on exertion 12/28/2019  . Anosmia 02/05/2018  . Constipation 12/20/2016  . Prostate cancer screening 10/17/2015  . Gastritis 09/20/2014  . Encounter for preventive health examination 09/20/2014  . Need for immunization against malaria 01/20/2014  . Erectile dysfunction associated with type 2 diabetes mellitus (HCC) 01/20/2014  . 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 Mixed hyperlipidemia  04/25/2007  . Essential hypertension 04/25/1999    Conditions to be addressed/monitored per PCP order: HTN, HLD and DMII  Patient Care Plan: Medication Management    Problem Identified: Diabetes, HLD     Long-Range Goal: Disease Progression Prevention   This Visit's Progress: On track  Priority: High  Note:   Current Barriers:  . Unable to achieve control of diabetes   Pharmacist Clinical Goal(s):  Marland Kitchen Over the next 90 days, patient will achieve control of diabetes as evidenced by A1c  through collaboration with PharmD and provider.  Interventions: . 1:1 collaboration with Randy Shams, MD regarding development and update of comprehensive plan of care as evidenced by provider attestation and co-signature . Inter-disciplinary care team collaboration (see longitudinal plan of care) . Comprehensive medication review performed; medication list updated in electronic medical record  Diabetes: .  Uncontrolled; current treatment: metformin 1000 mg BID, Ozempic 0.5 mg weekly, Basaglar 30 units daily, Humalog 10 units TID with meals,   . Current glucose readings: fasting glucose: 80-90s, post prandial glucose: 170-180s, occasional 200+ excursions . Recommended to continue current regimen at this time . Overdue for lab work and PCP follow up. Will collaborate w/ office staff to schedule appt.  . If next A1c not at goal, recommend increasing Ozempic to 1 mg weekly. Would reduce Basaglar dose to reduce risk of hypoglycemia if increasing Ozempic.   Hypertension: . Controlled; current treatment: telmisartan 40 mg daily, carvedilol 3.125 mg BID; overdue for f/u with cardiology . Recommended to continue current treatment at this time. He requested refill on telmisartan. Encouraged to f/u with PCP and cardiology  Hyperlipidemia and ASCVD risk reduction: . Uncontrolled but likely improved; current treatment: atorvastatin 40 mg daily, ezetimibe 10 mg daily, fenofibrate 160 mg daily; LDL 131 prior to  increasing atorvastatin to 40 mg. TG borderline at 150 . Antiplatelet regimen: aspirin 81 mg daily  . Due for lipid panel w/ PCP appt. Continue current regimen at this time. He requested refill on fenofibrate.  Patient Goals/Self-Care Activities . Over the next 90 days, patient will:  - take medications as prescribed check glucose BID, document, and provide at future appointments collaborate with provider on medication access solutions  Follow Up Plan: Telephone follow up appointment with care management team member scheduled for: ~  6 weeks     Medication Assistance: None required. Patient affirms current coverage meets needs.   Plan: Telephone follow up appointment with care management team member scheduled for: ~ 6 weeks  Catie Feliz Beam, PharmD, [place], CPP Clinical Pharmacist [facility] [employer] [phone]

## 2020-09-02 ENCOUNTER — Other Ambulatory Visit: Payer: Self-pay | Admitting: Internal Medicine

## 2020-09-02 DIAGNOSIS — IMO0002 Reserved for concepts with insufficient information to code with codable children: Secondary | ICD-10-CM

## 2020-09-02 DIAGNOSIS — E11319 Type 2 diabetes mellitus with unspecified diabetic retinopathy without macular edema: Secondary | ICD-10-CM

## 2020-09-03 ENCOUNTER — Telehealth: Payer: 59 | Admitting: Internal Medicine

## 2020-09-03 ENCOUNTER — Other Ambulatory Visit: Payer: Self-pay | Admitting: Internal Medicine

## 2020-09-03 DIAGNOSIS — E782 Mixed hyperlipidemia: Secondary | ICD-10-CM

## 2020-09-03 DIAGNOSIS — E1165 Type 2 diabetes mellitus with hyperglycemia: Secondary | ICD-10-CM

## 2020-09-09 ENCOUNTER — Other Ambulatory Visit (INDEPENDENT_AMBULATORY_CARE_PROVIDER_SITE_OTHER): Payer: 59

## 2020-09-09 ENCOUNTER — Other Ambulatory Visit: Payer: Self-pay

## 2020-09-09 DIAGNOSIS — E1165 Type 2 diabetes mellitus with hyperglycemia: Secondary | ICD-10-CM | POA: Diagnosis not present

## 2020-09-09 LAB — COMPREHENSIVE METABOLIC PANEL
ALT: 20 U/L (ref 0–53)
AST: 14 U/L (ref 0–37)
Albumin: 4.4 g/dL (ref 3.5–5.2)
Alkaline Phosphatase: 61 U/L (ref 39–117)
BUN: 14 mg/dL (ref 6–23)
CO2: 27 mEq/L (ref 19–32)
Calcium: 9.4 mg/dL (ref 8.4–10.5)
Chloride: 104 mEq/L (ref 96–112)
Creatinine, Ser: 0.83 mg/dL (ref 0.40–1.50)
GFR: 97.85 mL/min (ref >60.00)
Glucose, Bld: 123 mg/dL — ABNORMAL HIGH (ref 70–99)
Potassium: 4.5 mEq/L (ref 3.5–5.1)
Sodium: 137 mEq/L (ref 135–145)
Total Bilirubin: 0.5 mg/dL (ref 0.2–1.2)
Total Protein: 7 g/dL (ref 6.0–8.3)

## 2020-09-09 LAB — HEMOGLOBIN A1C: Hgb A1c MFr Bld: 6.8 % — ABNORMAL HIGH (ref 4.6–6.5)

## 2020-09-09 LAB — MICROALBUMIN / CREATININE URINE RATIO
Creatinine,U: 100.5 mg/dL
Microalb Creat Ratio: 2 mg/g (ref 0.0–30.0)
Microalb, Ur: 2 mg/dL — ABNORMAL HIGH (ref 0.0–1.9)

## 2020-09-11 ENCOUNTER — Telehealth: Payer: Self-pay

## 2020-09-11 NOTE — Progress Notes (Signed)
His diabetes is under control for the FIRST TIME IN MANY YEARS!  GREAT WORK  (thank you Catie)

## 2020-09-11 NOTE — Telephone Encounter (Signed)
Left message to call back for lab results.

## 2020-09-16 ENCOUNTER — Ambulatory Visit: Payer: 59 | Admitting: Internal Medicine

## 2020-09-16 ENCOUNTER — Encounter: Payer: Self-pay | Admitting: Internal Medicine

## 2020-09-16 ENCOUNTER — Other Ambulatory Visit: Payer: Self-pay | Admitting: Internal Medicine

## 2020-09-16 ENCOUNTER — Other Ambulatory Visit: Payer: Self-pay

## 2020-09-16 ENCOUNTER — Ambulatory Visit: Payer: 59 | Admitting: Pharmacist

## 2020-09-16 VITALS — BP 126/66 | HR 81 | Temp 98.3°F | Ht 65.98 in | Wt 162.8 lb

## 2020-09-16 DIAGNOSIS — Z1211 Encounter for screening for malignant neoplasm of colon: Secondary | ICD-10-CM

## 2020-09-16 DIAGNOSIS — Z23 Encounter for immunization: Secondary | ICD-10-CM | POA: Diagnosis not present

## 2020-09-16 DIAGNOSIS — I10 Essential (primary) hypertension: Secondary | ICD-10-CM

## 2020-09-16 DIAGNOSIS — IMO0002 Reserved for concepts with insufficient information to code with codable children: Secondary | ICD-10-CM

## 2020-09-16 DIAGNOSIS — E782 Mixed hyperlipidemia: Secondary | ICD-10-CM

## 2020-09-16 DIAGNOSIS — R790 Abnormal level of blood mineral: Secondary | ICD-10-CM | POA: Diagnosis not present

## 2020-09-16 DIAGNOSIS — E1165 Type 2 diabetes mellitus with hyperglycemia: Secondary | ICD-10-CM

## 2020-09-16 DIAGNOSIS — E11319 Type 2 diabetes mellitus with unspecified diabetic retinopathy without macular edema: Secondary | ICD-10-CM

## 2020-09-16 LAB — LIPID PANEL
Cholesterol: 173 mg/dL (ref 0–200)
HDL: 39.7 mg/dL (ref >39.00)
NonHDL: 132.85
Total CHOL/HDL Ratio: 4
Triglycerides: 247 mg/dL — ABNORMAL HIGH (ref 0.0–149.0)
VLDL: 49.4 mg/dL — ABNORMAL HIGH (ref 0.0–40.0)

## 2020-09-16 LAB — LDL CHOLESTEROL, DIRECT: Direct LDL: 111 mg/dL

## 2020-09-16 LAB — MAGNESIUM: Magnesium: 1.9 mg/dL (ref 1.5–2.5)

## 2020-09-16 MED ORDER — OZEMPIC (0.25 OR 0.5 MG/DOSE) 2 MG/1.5ML ~~LOC~~ SOPN: 0.5000 mg | PEN_INJECTOR | SUBCUTANEOUS | 3 refills | Status: DC

## 2020-09-16 MED ORDER — PANTOPRAZOLE SODIUM 40 MG PO TBEC
40.0000 mg | DELAYED_RELEASE_TABLET | Freq: Every day | ORAL | 2 refills | Status: DC
Start: 2020-09-16 — End: 2020-09-16

## 2020-09-16 NOTE — Progress Notes (Signed)
Subjective:  Patient ID: Randy Grant, male    DOB: 31-Jan-1966  Age: 55 y.o. MRN: [id number]  CC: The primary encounter diagnosis was Low magnesium level. Diagnoses of Mixed hyperlipidemia, Colon cancer screening, Need for tetanus booster, Essential hypertension, and Uncontrolled type 2 diabetes mellitus with hyperglycemia (HCC) were also pertinent to this visit.  HPI Randy Grant presents for FOR follow up on type 2 dm,  Hyperlipidemia and hypertension    HTN:  Patient is taking his medications as prescribed and notes no adverse effects.  Home BP readings have NOT  been done  HE  is avoiding added salt in her diet and walking regularly about 3 times per week for exercise    HAS .  Type 2 DM:  Started on Ozempic after last a1c ,  Basal insulin 45 units daily and novolog 8 units qac. tolerating medication well and A1c much improved.    Hyperlipidemia:  Taking zetia, lipitor and fenofibrate .     Outpatient Medications Prior to Visit  Medication Sig Dispense Refill  . ACCU-CHEK GUIDE test strip USE AS INSTRUCTED 100 strip 5  . aspirin 81 MG tablet Take 81 mg by mouth daily.    . B-D ULTRAFINE III SHORT PEN 31G X 8 MM MISC TEST BLOOD SUGARS 2 TIMES DAILY 100 each 6  . BAYER MICROLET LANCETS lancets USE AS DIRECTED 100 each 5  . carvedilol (COREG) 3.125 MG tablet Take 1 tablet (3.125 mg total) by mouth 2 (two) times daily with a meal. Please schedule office visit for further refills. Thank you! 60 tablet 0  . fenofibrate 160 MG tablet Take 1 tablet (160 mg total) by mouth at bedtime. 90 tablet 1  . glucose blood (ONE TOUCH ULTRA TEST) test strip Use as instructed three times daily to check blood sugars 100 each 12  . Insulin Glargine (BASAGLAR KWIKPEN) 100 UNIT/ML INJECT 45 UNITS INTO THE SKIN DAILY. 15 mL 1  . insulin lispro (HUMALOG KWIKPEN) 100 UNIT/ML KwikPen Inject 8 Units into the skin 3 (three) times daily. 30 mL 2  . Insulin Pen Needle (B-D ULTRAFINE III SHORT PEN) 31G X 8  MM MISC Test blood sugars 2 times daily 100 each 5  . metFORMIN (GLUCOPHAGE-XR) 500 MG 24 hr tablet TAKE 2 TABLETS BY MOUTH 2 TIMES DAILY. 360 tablet 1  . Multiple Vitamin (MULTIVITAMIN) tablet Take 1 tablet by mouth daily.    Marland Kitchen telmisartan (MICARDIS) 40 MG tablet Take 1 tablet (40 mg total) by mouth daily. 90 tablet 1  . UNIFINE PENTIPS 31G X 8 MM MISC TEST BLOOD SUGARS 2 TIMES DAILY 100 each 5  . atorvastatin (LIPITOR) 40 MG tablet Take 1 tablet (40 mg total) by mouth daily. 90 tablet 1  . ezetimibe (ZETIA) 10 MG tablet Take 1 tablet (10 mg total) by mouth daily. 90 tablet 1  . OZEMPIC, 0.25 OR 0.5 MG/DOSE, 2 MG/1.5ML SOPN INJECT 0.25 MG UNDER THE SKIN ONCE WEEKLY FOR 4 WEEKS, THEN INCREASE TO 0.5 MG WEEKLY 1.5 mL 2  . pantoprazole (PROTONIX) 40 MG tablet Take 1 tablet (40 mg total) by mouth daily. (Patient not taking: Reported on 09/16/2020) 30 tablet 0   No facility-administered medications prior to visit.    Review of Systems;  Patient denies headache, fevers, malaise, unintentional weight loss, skin rash, eye pain, sinus congestion and sinus pain, sore throat, dysphagia,  hemoptysis , cough, dyspnea, wheezing, chest pain, palpitations, orthopnea, edema, abdominal pain, nausea, melena, diarrhea, constipation, flank pain,  dysuria, hematuria, urinary  Frequency, nocturia, numbness, tingling, seizures,  Focal weakness, Loss of consciousness,  Tremor, insomnia, depression, anxiety, and suicidal ideation.      Objective:  BP 126/66 (BP Location: Left Arm, Patient Position: Sitting)   Pulse 81   Temp 98.3 F (36.8 C)   Ht 5' 5.98" (1.676 m)   Wt 162 lb 12.8 oz (73.8 kg)   SpO2 99%   BMI 26.29 kg/m   BP Readings from Last 3 Encounters:  09/16/20 126/66  04/01/20 140/70  03/29/20 (!) 150/82    Wt Readings from Last 3 Encounters:  09/16/20 162 lb 12.8 oz (73.8 kg)  04/01/20 161 lb 8 oz (73.3 kg)  03/29/20 164 lb 6.4 oz (74.6 kg)    General appearance: alert, cooperative and  appears stated age Ears: normal TM's and external ear canals both ears Throat: lips, mucosa, and tongue normal; teeth and gums normal Neck: no adenopathy, no carotid bruit, supple, symmetrical, trachea midline and thyroid not enlarged, symmetric, no tenderness/mass/nodules Back: symmetric, no curvature. ROM normal. No CVA tenderness. Lungs: clear to auscultation bilaterally Heart: regular rate and rhythm, S1, S2 normal, no murmur, click, rub or gallop Abdomen: soft, non-tender; bowel sounds normal; no masses,  no organomegaly Pulses: 2+ and symmetric Skin: Skin color, texture, turgor normal. No rashes or lesions Lymph nodes: Cervical, supraclavicular, and axillary nodes normal.  Lab Results  Component Value Date   HGBA1C 6.8 (H) 09/09/2020   HGBA1C 9.3 (H) 01/01/2020   HGBA1C 8.3 (H) 08/31/2019    Lab Results  Component Value Date   CREATININE 0.83 09/09/2020   CREATININE 0.74 03/21/2020   CREATININE 0.80 01/29/2020    Lab Results  Component Value Date   WBC 5.1 03/23/2020   HGB 13.4 03/23/2020   HCT 41.2 03/23/2020   PLT 316 03/23/2020   GLUCOSE 123 (H) 09/09/2020   CHOL 173 09/16/2020   TRIG 247.0 (H) 09/16/2020   HDL 39.70 09/16/2020   LDLDIRECT 111.0 09/16/2020   LDLCALC 131 (H) 03/21/2020   ALT 20 09/09/2020   AST 14 09/09/2020   NA 137 09/09/2020   K 4.5 09/09/2020   CL 104 09/09/2020   CREATININE 0.83 09/09/2020   BUN 14 09/09/2020   CO2 27 09/09/2020   TSH 1.16 03/29/2017   PSA 0.55 01/29/2020   INR 1.0 03/21/2020   HGBA1C 6.8 (H) 09/09/2020   MICROALBUR 2.0 (H) 09/09/2020    NM Myocar Multi W/Spect W/Wall Motion / EF  Result Date: 03/22/2020 Pharmacological myocardial perfusion imaging study with no significant  ischemia Normal wall motion, EF estimated at 65% No EKG changes concerning for ischemia at peak stress or in recovery. Low risk scan Signed, Dossie Arbour, MD, Ph.D [facility]   ECHOCARDIOGRAM COMPLETE  Result Date: 03/22/2020     ECHOCARDIOGRAM REPORT   Patient Name:   Randy Grant Date of Exam: 03/22/2020 Medical Rec #:  [id number]          Height:       66.0 in Accession #:    [id number]         Weight:       158.1 lb Date of Birth:  July 03, 1966           BSA:          1.810 m Patient Age:    54 years           BP:           117/78 mmHg Patient Gender: M  HR:           58 bpm. Exam Location:  [facility] Procedure: 2D Echo, Color Doppler and Cardiac Doppler Indications:     R07.9 Chest Pain  History:         Patient has no prior history of Echocardiogram examinations.                  Risk Factors:Hypertension, Diabetes and Dyslipidemia.  Sonographer:     Humphrey Rolls RDCS (AE) Referring Phys:  [id number] Antonieta Iba Diagnosing Phys: Julien Nordmann MD  Sonographer Comments: Suboptimal subcostal window. IMPRESSIONS  1. Left ventricular ejection fraction, by estimation, is 60 to 65%. The left ventricle has normal function. The left ventricle has no regional wall motion abnormalities. Left ventricular diastolic parameters are consistent with Grade I diastolic dysfunction (impaired relaxation).  2. Right ventricular systolic function is normal. The right ventricular size is normal. Tricuspid regurgitation signal is inadequate for assessing PA pressure. FINDINGS  Left Ventricle: Left ventricular ejection fraction, by estimation, is 60 to 65%. The left ventricle has normal function. The left ventricle has no regional wall motion abnormalities. The left ventricular internal cavity size was normal in size. There is  no left ventricular hypertrophy. Left ventricular diastolic parameters are consistent with Grade I diastolic dysfunction (impaired relaxation). Right Ventricle: The right ventricular size is normal. No increase in right ventricular wall thickness. Right ventricular systolic function is normal. Tricuspid regurgitation signal is inadequate for assessing PA pressure. Left Atrium: Left atrial size was normal in size. Right Atrium:  Right atrial size was normal in size. Pericardium: There is no evidence of pericardial effusion. Mitral Valve: The mitral valve is normal in structure. Normal mobility of the mitral valve leaflets. Mild mitral valve regurgitation. No evidence of mitral valve stenosis. MV peak gradient, 3.4 mmHg. The mean mitral valve gradient is 1.0 mmHg. Tricuspid Valve: The tricuspid valve is normal in structure. Tricuspid valve regurgitation is not demonstrated. No evidence of tricuspid stenosis. Aortic Valve: The aortic valve is normal in structure. Aortic valve regurgitation is not visualized. No aortic stenosis is present. Aortic valve mean gradient measures 3.0 mmHg. Aortic valve peak gradient measures 5.6 mmHg. Aortic valve area, by VTI measures 2.94 cm. Pulmonic Valve: The pulmonic valve was normal in structure. Pulmonic valve regurgitation is not visualized. No evidence of pulmonic stenosis. Aorta: The aortic root is normal in size and structure. Venous: The inferior vena cava is normal in size with greater than 50% respiratory variability, suggesting right atrial pressure of 3 mmHg. IAS/Shunts: No atrial level shunt detected by color flow Doppler.  LEFT VENTRICLE PLAX 2D LVIDd:         4.18 cm  Diastology LVIDs:         2.43 cm  LV e' lateral:   11.20 cm/s LV PW:         1.09 cm  LV E/e' lateral: 8.0 LV IVS:        0.75 cm  LV e' medial:    7.94 cm/s LVOT diam:     2.00 cm  LV E/e' medial:  11.3 LV SV:         63 LV SV Index:   35 LVOT Area:     3.14 cm  RIGHT VENTRICLE RV Basal diam:  2.38 cm LEFT ATRIUM             Index       RIGHT ATRIUM           Index  LA diam:        3.90 cm 2.16 cm/m  RA Area:     11.40 cm LA Vol (A2C):   38.7 ml 21.39 ml/m RA Volume:   22.70 ml  12.54 ml/m LA Vol (A4C):   18.3 ml 10.11 ml/m LA Biplane Vol: 28.9 ml 15.97 ml/m  AORTIC VALVE                   PULMONIC VALVE AV Area (Vmax):    2.57 cm    PV Vmax:       1.13 m/s AV Area (Vmean):   2.79 cm    PV Vmean:      73.800 cm/s AV Area  (VTI):     2.94 cm    PV VTI:        0.215 m AV Vmax:           118.00 cm/s PV Peak grad:  5.1 mmHg AV Vmean:          73.100 cm/s PV Mean grad:  2.0 mmHg AV VTI:            0.214 m AV Peak Grad:      5.6 mmHg AV Mean Grad:      3.0 mmHg LVOT Vmax:         96.60 cm/s LVOT Vmean:        65.000 cm/s LVOT VTI:          0.200 m LVOT/AV VTI ratio: 0.93  AORTA Ao Root diam: 2.70 cm MITRAL VALVE MV Area (PHT): 4.04 cm    SHUNTS MV Peak grad:  3.4 mmHg    Systemic VTI:  0.20 m MV Mean grad:  1.0 mmHg    Systemic Diam: 2.00 cm MV Vmax:       0.92 m/s MV Vmean:      50.9 cm/s MV Decel Time: 188 msec MV E velocity: 90.00 cm/s MV A velocity: 82.70 cm/s MV E/A ratio:  1.09 Julien Nordmann MD Electronically signed by Julien Nordmann MD Signature Date/Time: 03/22/2020/1:29:51 PM    Final     Assessment & Plan:   Problem List Items Addressed This Visit      Unprioritized   Essential hypertension    Patient is taking his medications as prescribed and notes no adverse effects.  Home BP readings have not been done,  But he is advised to start checking once weekly and notify office if they are consistently over  130/80 .  he is reminded to watch is salt intake and walking regularly about 3 times per week for exercise  .      Relevant Medications   ezetimibe (ZETIA) 10 MG tablet   atorvastatin (LIPITOR) 40 MG tablet   Mixed hyperlipidemia    He is prescribed  atorvastatin  40 mg, zetia and fenofibrate and current LDL is 111 . However goal is 39, and he has been out of Zetia for several weeks  Will retest in 3 months once all 3 medications have been taken appropriately   Lab Results  Component Value Date   CHOL 173 09/16/2020   HDL 39.70 09/16/2020   LDLCALC 131 (H) 03/21/2020   LDLDIRECT 111.0 09/16/2020   TRIG 247.0 (H) 09/16/2020   CHOLHDL 4 09/16/2020          Relevant Medications   ezetimibe (ZETIA) 10 MG tablet   atorvastatin (LIPITOR) 40 MG tablet   Other Relevant Orders   Lipid panel (Completed)    Uncontrolled  type 2 diabetes mellitus with hyperglycemia (HCC)    Previous loss of control is secondary to cultural food choices, cultural practices which including prolonged fasts ,  And work schedule.  Adding Ozempic and reducing insulin dose to prevent recurrent of hypoglycemic events has helped significantly along with the 24 hr blood glucose monitor .  Lab Results  Component Value Date   HGBA1C 6.8 (H) 09/09/2020   Lab Results  Component Value Date   MICROALBUR 2.0 (H) 09/09/2020   MICROALBUR 1.6 01/01/2020            Relevant Medications   atorvastatin (LIPITOR) 40 MG tablet    Other Visit Diagnoses    Low magnesium level    -  Primary   Relevant Orders   Magnesium (Completed)   Colon cancer screening       Relevant Orders   Cologuard   Need for tetanus booster       Relevant Orders   Td : Tetanus/diphtheria >7yo Preservative  free     I provided  30 minutes of  face-to-face time during this encounter reviewing patient's current problems and past surgeries, labs and imaging studies, providing counseling on the above mentioned problems , and coordination  of care .  I am having Khyree E. Mcelwee maintain his multivitamin, aspirin, glucose blood, Insulin Pen Needle, Bayer Microlet Lancets, B-D ULTRAFINE III SHORT PEN, Accu-Chek Guide, Unifine Pentips, insulin lispro, Basaglar KwikPen, metFORMIN, carvedilol, telmisartan, fenofibrate, pantoprazole, ezetimibe, and atorvastatin.  Meds ordered this encounter  Medications  . pantoprazole (PROTONIX) 40 MG tablet    Sig: Take 1 tablet (40 mg total) by mouth daily.    Dispense:  30 tablet    Refill:  2  . ezetimibe (ZETIA) 10 MG tablet    Sig: Take 1 tablet (10 mg total) by mouth daily.    Dispense:  90 tablet    Refill:  1  . atorvastatin (LIPITOR) 40 MG tablet    Sig: Take 1 tablet (40 mg total) by mouth daily.    Dispense:  90 tablet    Refill:  1    Medications Discontinued During This Encounter  Medication  Reason  . pantoprazole (PROTONIX) 40 MG tablet Reorder  . ezetimibe (ZETIA) 10 MG tablet Reorder  . atorvastatin (LIPITOR) 40 MG tablet Reorder    Follow-up: No follow-ups on file.   Sherlene Shamseresa L Soriah Leeman, MD

## 2020-09-16 NOTE — Assessment & Plan Note (Addendum)
Patient is taking his medications as prescribed and notes no adverse effects.  Home BP readings have not been done,  But he is advised to start checking once weekly and notify office if they are consistently over  130/80 .  he is reminded to watch is salt intake and walking regularly about 3 times per week for exercise  .

## 2020-09-16 NOTE — Chronic Care Management (AMB) (Signed)
Care Management   Pharmacy Note  09/16/2020 Name: CARLSON BELLAND MRN: [id number] DOB: May 12, 1966  Subjective: [name] is a 55 y.o. year old male who is a primary care patient of Sherlene Shams, MD. The Care Management team was consulted for assistance with care management and care coordination needs.    Engaged with patient face to face for follow up visit in response to provider referral for pharmacy case management and/or care coordination services.   The patient was given information about Care Management services today including:  1. Care Management services includes personalized support from designated clinical staff supervised by the patient's primary care provider, including individualized plan of care and coordination with other care providers. 2. 24/7 contact phone numbers for assistance for urgent and routine care needs. 3. The patient may stop case management services at any time by phone call to the office staff.  Patient agreed to services and consent obtained.  Assessment:  Review of patient status, including review of consultants reports, laboratory and other test data, was performed as part of comprehensive evaluation and provision of chronic care management services.   SDOH (Social Determinants of Health) assessments and interventions performed:    Objective:  Lab Results  Component Value Date   CREATININE 0.83 09/09/2020   CREATININE 0.74 03/21/2020   CREATININE 0.80 01/29/2020    Lab Results  Component Value Date   HGBA1C 6.8 (H) 09/09/2020       Component Value Date/Time   CHOL 211 (H) 03/21/2020 2024   TRIG 150 (H) 03/21/2020 2024   HDL 50 03/21/2020 2024   CHOLHDL 4.2 03/21/2020 2024   VLDL 30 03/21/2020 2024   LDLCALC 131 (H) 03/21/2020 2024   LDLDIRECT 130.0 01/01/2020 1040    Clinical ASCVD: No  The 10-year ASCVD risk score Denman George DC Jr., et al., 2013) is: 16.4%   Values used to calculate the score:     Age: 47 years     Sex:  Male     Is Non-Hispanic African American: No     Diabetic: Yes     Tobacco smoker: No     Systolic Blood Pressure: 140 mmHg     Is BP treated: Yes     HDL Cholesterol: 50 mg/dL     Total Cholesterol: 211 mg/dL    Other: ([id number] if Afib, PHQ9 if depression, MMRC or CAT for COPD, ACT, DEXA)  BP Readings from Last 3 Encounters:  09/16/20 140/72  04/01/20 140/70  03/29/20 (!) 150/82    Care Plan  No Known Allergies  Medications Reviewed Today    Reviewed by Lourena Simmonds, RPH-CPP (Pharmacist) on 09/16/20 at 1149  Med List Status: <None>  Medication Order Taking? Sig Documenting Provider Last Dose Status Informant  ACCU-CHEK GUIDE test strip [id number] Yes USE AS INSTRUCTED Sherlene Shams, MD Taking Active Self  aspirin 81 MG tablet [id number] Yes Take 81 mg by mouth daily. [provider] Taking Active Self  atorvastatin (LIPITOR) 40 MG tablet [id number] Yes Take 1 tablet (40 mg total) by mouth daily. Sherlene Shams, MD Taking Active   B-D ULTRAFINE III SHORT PEN 31G X 8 MM MISC [id number] Yes TEST BLOOD SUGARS 2 TIMES DAILY Sherlene Shams, MD Taking Active Self  BAYER MICROLET LANCETS lancets [id number] Yes USE AS DIRECTED Sherlene Shams, MD Taking Active Self  carvedilol (COREG) 3.125 MG tablet [id number] Yes Take 1 tablet (3.125 mg total) by mouth 2 (two) times daily with a meal. Please  schedule office visit for further refills. Thank you! Marisue Ivan D, PA-C Taking Active   ezetimibe (ZETIA) 10 MG tablet [id number] No Take 1 tablet (10 mg total) by mouth daily. Sherlene Shams, MD Not Taking Active   fenofibrate 160 MG tablet [id number] Yes Take 1 tablet (160 mg total) by mouth at bedtime. Sherlene Shams, MD Taking Active   glucose blood (ONE TOUCH ULTRA TEST) test strip [id number] Yes Use as instructed three times daily to check blood sugars Sherlene Shams, MD Taking Active Self  Insulin Glargine [facility] KWIKPEN) 100 UNIT/ML [id number] Yes INJECT 45 UNITS INTO  THE SKIN DAILY. Sherlene Shams, MD Taking Active            Med Note Ara Kussmaul Jul 29, 2020  2:36 PM) 30 units   insulin lispro (HUMALOG KWIKPEN) 100 UNIT/ML KwikPen [id number] Yes Inject 8 Units into the skin 3 (three) times daily. Sherlene Shams, MD Taking Active   Insulin Pen Needle (B-D ULTRAFINE III SHORT PEN) 31G X 8 MM MISC [id number] Yes Test blood sugars 2 times daily Sherlene Shams, MD Taking Active Self  metFORMIN (GLUCOPHAGE-XR) 500 MG 24 hr tablet [id number] Yes TAKE 2 TABLETS BY MOUTH 2 TIMES DAILY. Sherlene Shams, MD Taking Active   Multiple Vitamin (MULTIVITAMIN) tablet [id number] Yes Take 1 tablet by mouth daily. [provider] Taking Active Self  OZEMPIC, 0.25 OR 0.5 MG/DOSE, 2 MG/1.5ML SOPN [id number] Yes INJECT 0.25 MG UNDER THE SKIN ONCE WEEKLY FOR 4 WEEKS, THEN INCREASE TO 0.5 MG WEEKLY Sherlene Shams, MD Taking Active   pantoprazole (PROTONIX) 40 MG tablet [id number] No Take 1 tablet (40 mg total) by mouth daily.  Patient not taking: Reported on 09/16/2020   Charise Killian, MD Not Taking Active   telmisartan (MICARDIS) 40 MG tablet [id number] Yes Take 1 tablet (40 mg total) by mouth daily. Sherlene Shams, MD Taking Active   UNIFINE PENTIPS 31G X 8 MM MISC [id number] Yes TEST BLOOD SUGARS 2 TIMES DAILY Sherlene Shams, MD Taking Active Self          Patient Active Problem List   Diagnosis Date Noted  . Vertigo 03/29/2020  . Hospital discharge follow-up 03/29/2020  . Chest pain 03/21/2020  . Tachycardia 02/24/2020  . Abnormal electrocardiogram 02/24/2020  . Uncontrolled type 2 diabetes mellitus with hyperglycemia (HCC) 02/24/2020  . Neuropathy associated with endocrine disorder (HCC) 01/29/2020  . Dyspnea on exertion 12/28/2019  . Anosmia 02/05/2018  . Constipation 12/20/2016  . Prostate cancer screening 10/17/2015  . Gastritis 09/20/2014  . Encounter for preventive health examination 09/20/2014  . Need for immunization against malaria  01/20/2014  . Erectile dysfunction associated with type 2 diabetes mellitus (HCC) 01/20/2014  . 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 Mixed hyperlipidemia 04/25/2007  . Essential hypertension 04/25/1999    Conditions to be addressed/monitored: HTN, HLD and DMII  Care Plan : Medication Management  Updates made by Lourena Simmonds, RPH-CPP since 09/16/2020 12:00 AM    Problem: Diabetes, HLD     Long-Range Goal: Disease Progression Prevention   This Visit's Progress: On track  Recent Progress: On track  Priority: High  Note:   Current Barriers:  . Unable to achieve control of diabetes   Pharmacist Clinical Goal(s):  Marland Kitchen Over the next 90 days, patient will achieve control of diabetes as evidenced by A1c  through collaboration with PharmD and provider.  Interventions: . 1:1  collaboration with Sherlene Shams, MD regarding development and update of comprehensive plan of care as evidenced by provider attestation and co-signature . Inter-disciplinary care team collaboration (see longitudinal plan of care) . Comprehensive medication review performed; medication list updated in electronic medical record  Diabetes: . CONTROLLED; current treatment: metformin 1000 mg BID, Ozempic 0.25 mg weekly followed by 2 weeks of 0.5 mg weekly as he misunderstood instructions; Basaglar 30 units daily, Humalog 10 units TID with meals,   . Current glucose readings: fasting glucose: 80-90s, post prandial glucose: 150s . Does report a few episodes of hypoglycemia, reports hunger as his main symptom . Reiterated Ozempic dose. Continue 0.5 mg weekly continuously. Patient verbalized understanding.  . Educated to reduce Humalog to 8 units with meals if he develops post prandial hypoglycemia. He will call me if he develops other times of hypoglycemia and we can decide if Basaglar needs to be adjusted.  . Continue metformin 1000 mg BID  Hypertension: . Elevated on initial check today;  current treatment: telmisartan 40 mg daily, carvedilol 3.125 mg BID; overdue for f/u with cardiology . Discussed appropriate home BP check technique  . Follow BP at home. Encourage f/u with cardiology. Will review home BP readings at next appt.  Hyperlipidemia and ASCVD risk reduction: . Uncontrolled but likely improved; current treatment: atorvastatin 40 mg daily, fenofibrate 160 mg daily- reports that he ran out of ezetimibe and has not had for several months . Antiplatelet regimen: aspirin 81 mg daily .  Due for lipid recheck today. Discussed that we could follow LDL results to determine if ezetimibe needs to be restarted OR atorvastatin maximized.   GERD: . Uncontrolled per patient report. Previously on pantoprazole 40 mg daily, but notes he needs a refill . Encouraged to discuss need for refill w/ PCP.  Patient Goals/Self-Care Activities . Over the next 90 days, patient will:  - take medications as prescribed check glucose BID, document, and provide at future appointments collaborate with provider on medication access solutions  Follow Up Plan: Telephone follow up appointment with care management team member scheduled for: ~  6 weeks     Medication Assistance:  None required.  Patient affirms current coverage meets needs.  Follow Up:  Patient agrees to Care Plan and Follow-up.  Plan: Telephone follow up appointment with care management team member scheduled for:  ~ 6 weeks  Catie Feliz Beam, PharmD, [place], CPP Clinical Pharmacist [employer] [facility] [employer] [phone]

## 2020-09-16 NOTE — Patient Instructions (Addendum)
It was great to see you today!  Take Ozempic 0.5 mg weekly EVERY WEEK. 1 pen will last you 4 weeks now.   We'll follow up on your blood pressure and cholesterol when we talk in 6 weeks.   Take care!  Catie Feliz Beam, PharmD [phone]  Visit Information  Goals Addressed              This Visit's Progress     Patient Stated   .  Medication Monitoring (pt-stated)        Patient Goals/Self-Care Activities . Over the next 90 days, patient will:  - take medications as prescribed check glucose BID, document, and provide at future appointments collaborate with provider on medication access solutions        Print copy of patient instructions, educational materials, and care plan provided in person.    Plan: Telephone follow up appointment with care management team member scheduled for:  ~ 6 weeks  Catie Feliz Beam, PharmD, [place], CPP Clinical Pharmacist [facility] [employer] [phone]

## 2020-09-16 NOTE — Patient Instructions (Signed)
YOU ARE OVERDUE FOR YOUR EYE EXAM.  PLEASE GO GET THIS DONE    I will initiate the order for your colon cancer  screening  Test.  It is called  Cologuard.  It will be delivered to your house, and you will send off a stool sample in the envelope it provides  .   Please monitor your blood pressure at home .  Once a week.  Goal is 130/80 or less (not below 100/60)  You were due for your  Tetanus vaccine and this was done today

## 2020-09-17 ENCOUNTER — Other Ambulatory Visit: Payer: Self-pay | Admitting: Internal Medicine

## 2020-09-17 DIAGNOSIS — E1165 Type 2 diabetes mellitus with hyperglycemia: Secondary | ICD-10-CM | POA: Diagnosis not present

## 2020-09-17 DIAGNOSIS — I10 Essential (primary) hypertension: Secondary | ICD-10-CM | POA: Diagnosis not present

## 2020-09-17 DIAGNOSIS — Z1211 Encounter for screening for malignant neoplasm of colon: Secondary | ICD-10-CM | POA: Diagnosis not present

## 2020-09-17 DIAGNOSIS — Z23 Encounter for immunization: Secondary | ICD-10-CM

## 2020-09-17 DIAGNOSIS — E782 Mixed hyperlipidemia: Secondary | ICD-10-CM | POA: Diagnosis not present

## 2020-09-17 DIAGNOSIS — R790 Abnormal level of blood mineral: Secondary | ICD-10-CM | POA: Diagnosis not present

## 2020-09-17 MED ORDER — ATORVASTATIN CALCIUM 40 MG PO TABS: 40.0000 mg | ORAL_TABLET | Freq: Every day | ORAL | 1 refills | Status: DC

## 2020-09-17 MED ORDER — EZETIMIBE 10 MG PO TABS: 10.0000 mg | ORAL_TABLET | Freq: Every day | ORAL | 1 refills | Status: DC

## 2020-09-17 NOTE — Assessment & Plan Note (Addendum)
He is prescribed  atorvastatin  40 mg, zetia and fenofibrate and current LDL is 111 . However goal is 47, and he has been out of Zetia for several weeks  Will retest in 3 months once all 3 medications have been taken appropriately   Lab Results  Component Value Date   CHOL 173 09/16/2020   HDL 39.70 09/16/2020   LDLCALC 131 (H) 03/21/2020   LDLDIRECT 111.0 09/16/2020   TRIG 247.0 (H) 09/16/2020   CHOLHDL 4 09/16/2020

## 2020-09-17 NOTE — Assessment & Plan Note (Signed)
Previous loss of control is secondary to cultural food choices, cultural practices which including prolonged fasts ,  And work schedule.  Adding Ozempic and reducing insulin dose to prevent recurrent of hypoglycemic events has helped significantly along with the 24 hr blood glucose monitor .  Lab Results  Component Value Date   HGBA1C 6.8 (H) 09/09/2020   Lab Results  Component Value Date   MICROALBUR 2.0 (H) 09/09/2020   MICROALBUR 1.6 01/01/2020

## 2020-09-18 ENCOUNTER — Telehealth: Payer: Self-pay

## 2020-09-18 NOTE — Telephone Encounter (Signed)
Left message to call back for lab results and schedule for repeat labs in 3 months.

## 2020-09-19 ENCOUNTER — Telehealth: Payer: Self-pay | Admitting: Internal Medicine

## 2020-09-19 NOTE — Telephone Encounter (Signed)
Patient has been contacted at least 3 times for a recall, recall has been deleted  

## 2020-10-11 ENCOUNTER — Other Ambulatory Visit: Payer: Self-pay | Admitting: Internal Medicine

## 2020-10-11 DIAGNOSIS — E11319 Type 2 diabetes mellitus with unspecified diabetic retinopathy without macular edema: Secondary | ICD-10-CM

## 2020-10-11 DIAGNOSIS — IMO0002 Reserved for concepts with insufficient information to code with codable children: Secondary | ICD-10-CM

## 2020-10-27 NOTE — Progress Notes (Unsigned)
LV w/catie - few episodes of lows - reduced humalog from 10 units to 8 units  Plan -Basaglar 45 vs 30 units daily -fasting?? After meals??  -Lows? -Can increase Ozempic and decrease insulin requirement if needed and no GI side effects -been out of zetia for months - picked up yet? -compliance with HLD meds? - TG 150 in august then increased to 247 in February, LDL 111 - can increase atorv to 80 mg daily -diet/exercise - avoiding added salt in diet and walking regularly about 3 times per week -diet - avoiding carbs,sugars, alcohol -BP readings - checking once weekly?

## 2020-10-28 ENCOUNTER — Ambulatory Visit: Payer: 59 | Admitting: Pharmacist

## 2020-10-28 DIAGNOSIS — I10 Essential (primary) hypertension: Secondary | ICD-10-CM

## 2020-10-28 DIAGNOSIS — E1165 Type 2 diabetes mellitus with hyperglycemia: Secondary | ICD-10-CM

## 2020-10-28 DIAGNOSIS — E782 Mixed hyperlipidemia: Secondary | ICD-10-CM

## 2020-10-28 NOTE — Chronic Care Management (AMB) (Addendum)
Care Management   Pharmacy Note  10/28/2020 Name: Randy Grant MRN: [id number] DOB: 25-Jul-1966  Subjective: Randy Grant is a 55 y.o. year old male who is a primary care patient of Sherlene Shams, MD. The Care Management team was consulted for assistance with care management and care coordination needs.    Engaged with patient by telephone for follow up visit in response to provider referral for pharmacy case management and/or care coordination services.   The patient was given information about Care Management services today including:  1. Care Management services includes personalized support from designated clinical staff supervised by the patient's primary care provider, including individualized plan of care and coordination with other care providers. 2. 24/7 contact phone numbers for assistance for urgent and routine care needs. 3. The patient may stop case management services at any time by phone call to the office staff.  Patient agreed to services and consent obtained.  Assessment:  Review of patient status, including review of consultants reports, laboratory and other test data, was performed as part of comprehensive evaluation and provision of chronic care management services.   SDOH (Social Determinants of Health) assessments and interventions performed:  SDOH Interventions   Flowsheet Row Most Recent Value  SDOH Interventions   Financial Strain Interventions Intervention Not Indicated       Objective:  Lab Results  Component Value Date   CREATININE 0.83 09/09/2020   CREATININE 0.74 03/21/2020   CREATININE 0.80 01/29/2020    Lab Results  Component Value Date   HGBA1C 6.8 (H) 09/09/2020       Component Value Date/Time   CHOL 173 09/16/2020 1223   TRIG 247.0 (H) 09/16/2020 1223   HDL 39.70 09/16/2020 1223   CHOLHDL 4 09/16/2020 1223   VLDL 49.4 (H) 09/16/2020 1223   LDLCALC 131 (H) 03/21/2020 2024   LDLDIRECT 111.0 09/16/2020 1223    Clinical  ASCVD: No  The 10-year ASCVD risk score Denman George DC Jr., et al., 2013) is: 13.4%   Values used to calculate the score:     Age: 69 years     Sex: Male     Is Non-Hispanic African American: No     Diabetic: Yes     Tobacco smoker: No     Systolic Blood Pressure: 126 mmHg     Is BP treated: Yes     HDL Cholesterol: 39.7 mg/dL     Total Cholesterol: 173 mg/dL     BP Readings from Last 3 Encounters:  09/16/20 126/66  04/01/20 140/70  03/29/20 (!) 150/82    Care Plan  No Known Allergies  Medications Reviewed Today    Reviewed by Lavona Mound, RPH (Pharmacist) on 10/28/20 at 0906  Med List Status: <None>  Medication Order Taking? Sig Documenting Provider Last Dose Status Informant  ACCU-CHEK GUIDE test strip [id number]  USE AS INSTRUCTED Sherlene Shams, MD  Active Self  aspirin 81 MG tablet [id number] Yes Take 81 mg by mouth daily. [provider] Taking Active Self  atorvastatin (LIPITOR) 40 MG tablet [id number] Yes Take 1 tablet (40 mg total) by mouth daily. Sherlene Shams, MD Taking Active   B-D ULTRAFINE III SHORT PEN 31G X 8 MM MISC [id number]  TEST BLOOD SUGARS 2 TIMES DAILY Sherlene Shams, MD  Active Self  BAYER MICROLET LANCETS lancets [id number]  USE AS DIRECTED Sherlene Shams, MD  Active Self  carvedilol (COREG) 3.125 MG tablet [id number] Yes Take 1 tablet (3.125 mg total) by  mouth 2 (two) times daily with a meal. Please schedule office visit for further refills. Thank you! Marisue Ivan D, PA-C Taking Active   ezetimibe (ZETIA) 10 MG tablet [id number] Yes Take 1 tablet (10 mg total) by mouth daily. Sherlene Shams, MD Taking Active   fenofibrate 160 MG tablet [id number] Yes Take 1 tablet (160 mg total) by mouth at bedtime. Sherlene Shams, MD Taking Active   glucose blood (ONE TOUCH ULTRA TEST) test strip [id number]  Use as instructed three times daily to check blood sugars Sherlene Shams, MD  Active Self  Insulin Glargine [facility] KWIKPEN) 100 UNIT/ML [id number] Yes  INJECT 45 UNITS INTO THE SKIN DAILY. Sherlene Shams, MD Taking Active            Med Note [facility], IVY A   Mon Oct 28, 2020  9:05 AM) Taking 30 units daily  insulin lispro (HUMALOG KWIKPEN) 100 UNIT/ML KwikPen [id number] Yes Inject 8 Units into the skin 3 (three) times daily. Sherlene Shams, MD Taking Active            Med Note [facility], IV[facility] A   Mon Oct 28, 2020  9:05 AM) Taking 10 units TID with meals  Insulin Pen Needle (B-D ULTRAFINE III SHORT PEN) 31G X 8 MM MISC [id number]  Test blood sugars 2 times daily Sherlene Shams, MD  Active Self  metFORMIN (GLUCOPHAGE-XR) 500 MG 24 hr tablet [id number] Yes TAKE 2 TABLETS BY MOUTH 2 TIMES DAILY. Sherlene Shams, MD Taking Active   Multiple Vitamin (MULTIVITAMIN) tablet [id number] Yes Take 1 tablet by mouth daily. [provider] Taking Active Self  pantoprazole (PROTONIX) 40 MG tablet [id number] Yes Take 1 tablet (40 mg total) by mouth daily. Sherlene Shams, MD Taking Expired 10/16/20 2359   Semaglutide,0.25 or 0.5MG /DOS, (OZEMPIC, 0.25 OR 0.5 MG/DOSE,) 2 MG/1.5ML SOPN [id number] Yes Inject 0.5 mg into the skin once a week. Sherlene Shams, MD Taking Active   telmisartan (MICARDIS) 40 MG tablet [id number] Yes Take 1 tablet (40 mg total) by mouth daily. Sherlene Shams, MD Taking Active   UNIFINE PENTIPS 31G X 8 MM MISC [id number]  TEST BLOOD SUGARS 2 TIMES DAILY Sherlene Shams, MD  Active Self          Patient Active Problem List   Diagnosis Date Noted  . Vertigo 03/29/2020  . Hospital discharge follow-up 03/29/2020  . Chest pain 03/21/2020  . Tachycardia 02/24/2020  . Abnormal electrocardiogram 02/24/2020  . Uncontrolled type 2 diabetes mellitus with hyperglycemia (HCC) 02/24/2020  . Neuropathy associated with endocrine disorder (HCC) 01/29/2020  . Dyspnea on exertion 12/28/2019  . Anosmia 02/05/2018  . Constipation 12/20/2016  . Prostate cancer screening 10/17/2015  . Gastritis 09/20/2014  . Encounter for preventive health examination  09/20/2014  . Need for immunization against malaria 01/20/2014  . Erectile dysfunction associated with type 2 diabetes mellitus (HCC) 01/20/2014  . 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 Mixed hyperlipidemia 04/25/2007  . Essential hypertension 04/25/1999    Conditions to be addressed/monitored: HTN, HLD, Hypertriglyceridemia and DMII  Care Plan : Medication Management  Updates made by Nwogu, Ivy A, RPH since 10/28/2020 12:00 AM    Problem: Diabetes, HLD     Long-Range Goal: Disease Progression Prevention   This Visit's Progress: On track  Recent Progress: On track  Priority: High  Note:   Current Barriers:  . Unable to achieve control of diabetes   Pharmacist Clinical Goal(s):  .  Over the next 90 days, patient will achieve control of diabetes as evidenced by A1c  through collaboration with PharmD and provider.  Interventions: . 1:1 collaboration with Sherlene Shams, MD regarding development and update of comprehensive plan of care as evidenced by provider attestation and co-signature . Inter-disciplinary care team collaboration (see longitudinal plan of care) . Comprehensive medication review performed; medication list updated in electronic medical record  Diabetes: . CONTROLLED w/A1c 6.8%; current treatment: metformin 1000 mg BID, Ozempic 0.5 mg weekly (Fridays), Basaglar 30 units daily, Humalog 10 units TID with meals . Denies GI symptoms secondary to Ozempic . Current glucose readings: fasting glucose: 90-120s, post prandial glucose: 180 . Reports 1 episode of hypoglycemia 2 weeks ago . Diet: salads, bread, chicken soup, limits meat, green tea w/splenda; avoiding added salt in diet  . Exercise: walking regularly about 3 times per week . Educated to reduce Humalog to 8 units with meals if he develops post prandial hypoglycemia. He will call me if he develops other times of hypoglycemia and we can decide if Basaglar needs to be adjusted.   . Recommended to continue current regimen  Hypertension: . Controlled per last visit; current treatment: telmisartan 40 mg daily (AM), carvedilol 3.125 mg BID . Home BP readings: not checking . Denies symptoms of hypotension/hypertension . Recommended to continue current regimen  Hyperlipidemia and ASCVD risk reduction: . Uncontrolled but likely improved since restarting ezetimibe; current treatment: atorvastatin 40 mg daily, ezetimibe 10 mg daily, fenofibrate 160 mg daily . Antiplatelet regimen: aspirin 81 mg daily . Recommended to continue current regimen  GERD: . Controlled per patient report. Current treatment: pantoprazole 40 mg daily . Recommended to continue current regimen  Patient Goals/Self-Care Activities . Over the next 90 days, patient will:  - take medications as prescribed check glucose BID, document, and provide at future appointments collaborate with provider on medication access solutions  Follow Up Plan: Telephone follow up appointment with care management team member scheduled for: ~  12 weeks     Medication Assistance:  None required.  Patient affirms current coverage meets needs.  Follow Up:  Patient agrees to Care Plan and Follow-up.  Plan: Telephone follow up appointment with care management team member scheduled for:  ~ 12 weeks  Fabio Neighbors, PharmD, BCPS PGY2 Ambulatory Care Resident [facility]    I was present for this visit and agree with the documentation by the resident as above.   Catie Feliz Beam, PharmD, [place], CPP Clinical Pharmacist [facility] [employer] [phone]

## 2020-10-28 NOTE — Patient Instructions (Addendum)
  Visit Information  Goals Addressed              This Visit's Progress   .  Medication Monitoring (pt-stated)        Patient Goals/Self-Care Activities . Over the next 90 days, patient will:  - take medications as prescribed check glucose BID, document, and provide at future appointments collaborate with provider on medication access solutions.        Patient verbalizes understanding of instructions provided today and agrees to view in MyChart.   Telephone follow up appointment with care management team member scheduled for: ~12 weeks  Fabio Neighbors, PharmD, BCPS PGY2 Ambulatory Care Resident [facility]  [facility]armacy

## 2020-11-01 ENCOUNTER — Other Ambulatory Visit: Payer: Self-pay | Admitting: Internal Medicine

## 2020-11-01 DIAGNOSIS — E1165 Type 2 diabetes mellitus with hyperglycemia: Secondary | ICD-10-CM

## 2020-11-01 MED ORDER — TRESIBA FLEXTOUCH 100 UNIT/ML ~~LOC~~ SOPN: 45.0000 [IU] | PEN_INJECTOR | Freq: Every day | SUBCUTANEOUS | 3 refills | Status: DC

## 2020-11-01 NOTE — Assessment & Plan Note (Signed)
FORMULARY CHANGE:  REPLACING BASAGLAR WITH TRESIBA

## 2020-11-05 ENCOUNTER — Telehealth: Payer: Self-pay | Admitting: Internal Medicine

## 2020-11-05 ENCOUNTER — Other Ambulatory Visit: Payer: Self-pay | Admitting: Internal Medicine

## 2020-11-05 DIAGNOSIS — E1165 Type 2 diabetes mellitus with hyperglycemia: Secondary | ICD-10-CM

## 2020-11-05 MED ORDER — SEMGLEE 100 UNIT/ML ~~LOC~~ SOPN: 45.0000 [IU] | PEN_INJECTOR | Freq: Every day | SUBCUTANEOUS | 3 refills | Status: DC

## 2020-11-05 NOTE — Telephone Encounter (Signed)
Yes.  I am copying Catie on This in the event that a dose change is needed

## 2020-11-05 NOTE — Telephone Encounter (Signed)
I will wait for Pharm-D to advise.

## 2020-11-05 NOTE — Telephone Encounter (Signed)
Yes this is fine. I have sent the new order. Please call patient and advise to complete current [place] supply and then start Semglee, inject the same # of units at the same time of day he had been injecting [place].

## 2020-11-05 NOTE — Telephone Encounter (Signed)
Patient insurance wil not cover [place] Patient would like to switch to [facility] a preferred basal insulin that makes a coupon available cutting his cost to zero ok to change?

## 2020-11-05 NOTE — Telephone Encounter (Signed)
Patient has been notified and voiced understanding to new medication and dosing.

## 2020-11-07 ENCOUNTER — Other Ambulatory Visit: Payer: Self-pay | Admitting: Internal Medicine

## 2020-11-08 ENCOUNTER — Other Ambulatory Visit: Payer: Self-pay | Admitting: Internal Medicine

## 2020-11-10 ENCOUNTER — Other Ambulatory Visit: Payer: Self-pay

## 2020-11-30 DIAGNOSIS — Z20822 Contact with and (suspected) exposure to covid-19: Secondary | ICD-10-CM | POA: Diagnosis not present

## 2020-12-23 ENCOUNTER — Other Ambulatory Visit: Payer: Self-pay

## 2020-12-23 MED FILL — Semaglutide Soln Pen-inj 0.25 or 0.5 MG/DOSE (2 MG/1.5ML): SUBCUTANEOUS | 28 days supply | Qty: 1.5 | Fill #0 | Status: AC

## 2020-12-23 MED FILL — Ezetimibe Tab 10 MG: ORAL | 90 days supply | Qty: 90 | Fill #0 | Status: AC

## 2020-12-23 MED FILL — Carvedilol Tab 3.125 MG: ORAL | 30 days supply | Qty: 60 | Fill #0 | Status: AC

## 2020-12-23 MED FILL — Atorvastatin Calcium Tab 40 MG (Base Equivalent): ORAL | 90 days supply | Qty: 90 | Fill #0 | Status: AC

## 2020-12-27 ENCOUNTER — Ambulatory Visit: Payer: 59 | Admitting: Internal Medicine

## 2021-01-13 ENCOUNTER — Encounter: Payer: Self-pay | Admitting: Internal Medicine

## 2021-01-13 ENCOUNTER — Ambulatory Visit (INDEPENDENT_AMBULATORY_CARE_PROVIDER_SITE_OTHER): Payer: 59 | Admitting: Internal Medicine

## 2021-01-13 ENCOUNTER — Other Ambulatory Visit: Payer: Self-pay

## 2021-01-13 VITALS — BP 112/80 | HR 92 | Temp 96.1°F | Resp 15 | Ht 65.0 in | Wt 155.4 lb

## 2021-01-13 DIAGNOSIS — R079 Chest pain, unspecified: Secondary | ICD-10-CM

## 2021-01-13 DIAGNOSIS — E11319 Type 2 diabetes mellitus with unspecified diabetic retinopathy without macular edema: Secondary | ICD-10-CM

## 2021-01-13 DIAGNOSIS — E1165 Type 2 diabetes mellitus with hyperglycemia: Secondary | ICD-10-CM | POA: Diagnosis not present

## 2021-01-13 DIAGNOSIS — I10 Essential (primary) hypertension: Secondary | ICD-10-CM | POA: Diagnosis not present

## 2021-01-13 DIAGNOSIS — R9431 Abnormal electrocardiogram [ECG] [EKG]: Secondary | ICD-10-CM | POA: Diagnosis not present

## 2021-01-13 DIAGNOSIS — IMO0002 Reserved for concepts with insufficient information to code with codable children: Secondary | ICD-10-CM

## 2021-01-13 NOTE — Patient Instructions (Addendum)
Let me know if you continue to lose weight (too much is not good)  Reduce your  evening dose of Lispro insulin  to 4 units before meal. If the low blood sugars continue,  Stop taking the evening dose completely

## 2021-01-13 NOTE — Progress Notes (Signed)
Subjective:  Patient ID: Randy Grant, male    DOB: 1965-11-15  Age: 55 y.o. 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 Diagnoses of Chest pain, unspecified type, Abnormal electrocardiogram, and Essential hypertension were also pertinent to this visit.  HPI Ponce E Surowiec presents for follow up on type 2 DM complicated by  hyperlipidemia and diabetic retinopathy   This visit occurred during the SARS-CoV-2 public health emergency.  Safety protocols were in place, including screening questions prior to the visit, additional usage of staff PPE, and extensive cleaning of exam room while observing appropriate contact time as indicated for disinfecting solutions.   T2DM:  He feels generally well, is exercising several times per week and checking blood sugars once daily at variable times.  BS have been under 130 fasting and < 150 post prandially.  Denies any recent hypoglyemic events.  Taking his medications as directed. Following a carbohydrate modified diet 6 days per week. Denies numbness, burning and tingling of extremities. Appetite is good.  Overdue for eye exam.  Wt loss of 8 lb since February since starting ozempic .  Denies nausea.  Sugars are lower,  Had a low epiosde of 65  On 2  Occasions; both occurred in the evening after his post dinner walk .  Grief:  Mother passed away 4 months ago in the [place].  Father died 6 years ago. He has  4 sisters and 2 brothers remaining , all living in the [place] in increasingly worse conditions. Reviewed his mood, coping skills.  His wife is here with him.     Outpatient Medications Prior to Visit  Medication Sig Dispense Refill  . ACCU-CHEK GUIDE test strip USE AS INSTRUCTED 100 strip 5  . aspirin 81 MG tablet Take 81 mg by mouth daily.    Marland Kitchen atorvastatin (LIPITOR) 40 MG tablet TAKE 1 TABLET BY MOUTH DAILY. 90 tablet 1  . B-D ULTRAFINE III SHORT PEN 31G X 8 MM MISC TEST BLOOD SUGARS  2 TIMES DAILY 100 each 6  . BAYER MICROLET LANCETS lancets USE AS DIRECTED 100 each 5  . carvedilol (COREG) 3.125 MG tablet TAKE 1 TABLET BY MOUTH 2 TIMES DAILY WITH A MEAL. 60 tablet 1  . ezetimibe (ZETIA) 10 MG tablet TAKE 1 TABLET BY MOUTH DAILY. 90 tablet 1  . fenofibrate 160 MG tablet TAKE 1 TABLET BY MOUTH AT BEDTIME. 90 tablet 1  . glucose blood (ONE TOUCH ULTRA TEST) test strip Use as instructed three times daily to check blood sugars 100 each 12  . insulin glargine (SEMGLEE) 100 UNIT/ML Solostar Pen Inject 45 Units into the skin daily. 42 mL 3  . Insulin Glargine-yfgn 100 UNIT/ML SOPN INJECT 45 UNITS INTO THE SKIN DAILY. 45 mL 3  . insulin lispro (HUMALOG KWIKPEN) 100 UNIT/ML KwikPen Inject 8 Units into the skin 3 (three) times daily. 30 mL 2  . Insulin Pen Needle (B-D ULTRAFINE III SHORT PEN) 31G X 8 MM MISC Test blood sugars 2 times daily 100 each 5  . metFORMIN (GLUCOPHAGE-XR) 500 MG 24 hr tablet TAKE 2 TABLETS BY MOUTH 2 TIMES DAILY. 360 tablet 1  . Multiple Vitamin (MULTIVITAMIN) tablet Take 1 tablet by mouth daily.    . pantoprazole (PROTONIX) 40 MG tablet TAKE 1 TABLET BY MOUTH DAILY 30 tablet 2  . Semaglutide,0.25 or 0.5MG /DOS, 2 MG/1.5ML SOPN INJECT 0.5 MG INTO THE SKIN ONCE A WEEK. 1.5 mL 3  . telmisartan (MICARDIS)  40 MG tablet TAKE 1 TABLET (40 MG TOTAL) BY MOUTH DAILY. 90 tablet 1   No facility-administered medications prior to visit.    Review of Systems;  Patient denies headache, fevers, malaise, unintentional weight loss, skin rash, eye pain, sinus congestion and sinus pain, sore throat, dysphagia,  hemoptysis , cough, dyspnea, wheezing, chest pain, palpitations, orthopnea, edema, abdominal pain, nausea, melena, diarrhea, constipation, flank pain, dysuria, hematuria, urinary  Frequency, nocturia, numbness, tingling, seizures,  Focal weakness, Loss of consciousness,  Tremor, insomnia, depression, anxiety, and suicidal ideation.      Objective:  BP 112/80 (BP  Location: Left Arm, Patient Position: Sitting, Cuff Size: Normal)   Pulse 92   Temp (!) 96.1 F (35.6 C) (Temporal)   Resp 15   Ht 5\' 5"  (1.651 m)   Wt 155 lb 6.4 oz (70.5 kg)   SpO2 99%   BMI 25.86 kg/m   BP Readings from Last 3 Encounters:  01/13/21 112/80  09/16/20 126/66  04/01/20 140/70    Wt Readings from Last 3 Encounters:  01/13/21 155 lb 6.4 oz (70.5 kg)  09/16/20 162 lb 12.8 oz (73.8 kg)  04/01/20 161 lb 8 oz (73.3 kg)    General appearance: alert, cooperative and appears stated age Ears: normal TM's and external ear canals both ears Throat: lips, mucosa, and tongue normal; teeth and gums normal Neck: no adenopathy, no carotid bruit, supple, symmetrical, trachea midline and thyroid not enlarged, symmetric, no tenderness/mass/nodules Back: symmetric, no curvature. ROM normal. No CVA tenderness. Lungs: clear to auscultation bilaterally Heart: regular rate and rhythm, S1, S2 normal, no murmur, click, rub or gallop Abdomen: soft, non-tender; bowel sounds normal; no masses,  no organomegaly Pulses: 2+ and symmetric Skin: Skin color, texture, turgor normal. No rashes or lesions Lymph nodes: Cervical, supraclavicular, and axillary nodes normal.  Lab Results  Component Value Date   HGBA1C 6.8 (H) 09/09/2020   HGBA1C 9.3 (H) 01/01/2020   HGBA1C 8.3 (H) 08/31/2019    Lab Results  Component Value Date   CREATININE 0.83 09/09/2020   CREATININE 0.74 03/21/2020   CREATININE 0.80 01/29/2020    Lab Results  Component Value Date   WBC 5.1 03/23/2020   HGB 13.4 03/23/2020   HCT 41.2 03/23/2020   PLT 316 03/23/2020   GLUCOSE 123 (H) 09/09/2020   CHOL 173 09/16/2020   TRIG 247.0 (H) 09/16/2020   HDL 39.70 09/16/2020   LDLDIRECT 111.0 09/16/2020   LDLCALC 131 (H) 03/21/2020   ALT 20 09/09/2020   AST 14 09/09/2020   NA 137 09/09/2020   K 4.5 09/09/2020   CL 104 09/09/2020   CREATININE 0.83 09/09/2020   BUN 14 09/09/2020   CO2 27 09/09/2020   TSH 1.16  03/29/2017   PSA 0.55 01/29/2020   INR 1.0 03/21/2020   HGBA1C 6.8 (H) 09/09/2020   MICROALBUR 2.0 (H) 09/09/2020    NM Myocar Multi W/Spect W/Wall Motion / EF  Result Date: 03/22/2020 Pharmacological myocardial perfusion imaging study with no significant  ischemia Normal wall motion, EF estimated at 65% No EKG changes concerning for ischemia at peak stress or in recovery. Low risk scan Signed, 03/24/2020, MD, Ph.D [facility]   ECHOCARDIOGRAM COMPLETE  Result Date: 03/22/2020    ECHOCARDIOGRAM REPORT   Patient Name:   CAIDENCE HIGASHI Date of Exam: 03/22/2020 Medical Rec #:  03/24/2020          Height:       66.0 in Accession #:    [id number]  Weight:       158.1 lb Date of Birth:  1966-06-12           BSA:          1.810 m Patient Age:    54 years           BP:           117/78 mmHg Patient Gender: M                  HR:           58 bpm. Exam Location:  [facility] Procedure: 2D Echo, Color Doppler and Cardiac Doppler Indications:     R07.9 Chest Pain  History:         Patient has no prior history of Echocardiogram examinations.                  Risk Factors:Hypertension, Diabetes and Dyslipidemia.  Sonographer:     Humphrey Rolls RDCS (AE) Referring Phys:  [id number] Antonieta Iba Diagnosing Phys: Julien Nordmann MD  Sonographer Comments: Suboptimal subcostal window. IMPRESSIONS  1. Left ventricular ejection fraction, by estimation, is 60 to 65%. The left ventricle has normal function. The left ventricle has no regional wall motion abnormalities. Left ventricular diastolic parameters are consistent with Grade I diastolic dysfunction (impaired relaxation).  2. Right ventricular systolic function is normal. The right ventricular size is normal. Tricuspid regurgitation signal is inadequate for assessing PA pressure. FINDINGS  Left Ventricle: Left ventricular ejection fraction, by estimation, is 60 to 65%. The left ventricle has normal function. The left ventricle has no regional wall motion abnormalities.  The left ventricular internal cavity size was normal in size. There is  no left ventricular hypertrophy. Left ventricular diastolic parameters are consistent with Grade I diastolic dysfunction (impaired relaxation). Right Ventricle: The right ventricular size is normal. No increase in right ventricular wall thickness. Right ventricular systolic function is normal. Tricuspid regurgitation signal is inadequate for assessing PA pressure. Left Atrium: Left atrial size was normal in size. Right Atrium: Right atrial size was normal in size. Pericardium: There is no evidence of pericardial effusion. Mitral Valve: The mitral valve is normal in structure. Normal mobility of the mitral valve leaflets. Mild mitral valve regurgitation. No evidence of mitral valve stenosis. MV peak gradient, 3.4 mmHg. The mean mitral valve gradient is 1.0 mmHg. Tricuspid Valve: The tricuspid valve is normal in structure. Tricuspid valve regurgitation is not demonstrated. No evidence of tricuspid stenosis. Aortic Valve: The aortic valve is normal in structure. Aortic valve regurgitation is not visualized. No aortic stenosis is present. Aortic valve mean gradient measures 3.0 mmHg. Aortic valve peak gradient measures 5.6 mmHg. Aortic valve area, by VTI measures 2.94 cm. Pulmonic Valve: The pulmonic valve was normal in structure. Pulmonic valve regurgitation is not visualized. No evidence of pulmonic stenosis. Aorta: The aortic root is normal in size and structure. Venous: The inferior vena cava is normal in size with greater than 50% respiratory variability, suggesting right atrial pressure of 3 mmHg. IAS/Shunts: No atrial level shunt detected by color flow Doppler.  LEFT VENTRICLE PLAX 2D LVIDd:         4.18 cm  Diastology LVIDs:         2.43 cm  LV e' lateral:   11.20 cm/s LV PW:         1.09 cm  LV E/e' lateral: 8.0 LV IVS:        0.75 cm  LV e' medial:  7.94 cm/s LVOT diam:     2.00 cm  LV E/e' medial:  11.3 LV SV:         63 LV SV Index:    35 LVOT Area:     3.14 cm  RIGHT VENTRICLE RV Basal diam:  2.38 cm LEFT ATRIUM             Index       RIGHT ATRIUM           Index LA diam:        3.90 cm 2.16 cm/m  RA Area:     11.40 cm LA Vol (A2C):   38.7 ml 21.39 ml/m RA Volume:   22.70 ml  12.54 ml/m LA Vol (A4C):   18.3 ml 10.11 ml/m LA Biplane Vol: 28.9 ml 15.97 ml/m  AORTIC VALVE                   PULMONIC VALVE AV Area (Vmax):    2.57 cm    PV Vmax:       1.13 m/s AV Area (Vmean):   2.79 cm    PV Vmean:      73.800 cm/s AV Area (VTI):     2.94 cm    PV VTI:        0.215 m AV Vmax:           118.00 cm/s PV Peak grad:  5.1 mmHg AV Vmean:          73.100 cm/s PV Mean grad:  2.0 mmHg AV VTI:            0.214 m AV Peak Grad:      5.6 mmHg AV Mean Grad:      3.0 mmHg LVOT Vmax:         96.60 cm/s LVOT Vmean:        65.000 cm/s LVOT VTI:          0.200 m LVOT/AV VTI ratio: 0.93  AORTA Ao Root diam: 2.70 cm MITRAL VALVE MV Area (PHT): 4.04 cm    SHUNTS MV Peak grad:  3.4 mmHg    Systemic VTI:  0.20 m MV Mean grad:  1.0 mmHg    Systemic Diam: 2.00 cm MV Vmax:       0.92 m/s MV Vmean:      50.9 cm/s MV Decel Time: 188 msec MV E velocity: 90.00 cm/s MV A velocity: 82.70 cm/s MV E/A ratio:  1.09 Julien Nordmann MD Electronically signed by Julien Nordmann MD Signature Date/Time: 03/22/2020/1:29:51 PM    Final     Assessment & Plan:   Problem List Items Addressed This Visit      Unprioritized   Abnormal electrocardiogram    He is tolerating low dose carvedilol started by cardiology without bradycardia       Chest pain    He was referred to cardiology in August 2021 after a brief hospital admission for chest pain acc/ by nausea/vomiting.  He has a bifascicular block on EKG,  Normal ECHO, and low risk stress test. He did not follow up with cardiology for [facility].Marland Kitchen  He reports no repeat symptoms with use of PPI       Essential hypertension    Patient is taking his telmisartan,  Carvedilol and  notes no adverse effects.  Home BP readings have not  been done,  But he is advised to start checking once weekly and notify office if they are consistently over  130/80 .  he is reminded to watch is salt intake and walking regularly about 3 times per week for exercise  .      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  He has lost 8 lbs and has had 2 hypoglycemic episodes.  Will reduce his evening Lispro dose from 8 units to 4 units.  If weight loss continues will change to Trulicity .  He has not followed up with his ophthalmologist for his retinopathy due to financial issues.  Continue statin, arb, return for fasting labs        Relevant Orders   Lipid panel   Comprehensive metabolic panel   Hemoglobin A1c     A total of 25 minutes of face to face time was spent with patient more than half of which was spent in counselling about his glycemic control,  Grief, weight loss   and coordination of care   I am having Keyan E. Scollard maintain his multivitamin, aspirin, glucose blood, Insulin Pen Needle, Bayer Microlet Lancets, B-D ULTRAFINE III SHORT PEN, Accu-Chek Guide, insulin lispro, Semglee, carvedilol, Insulin Glargine-yfgn, atorvastatin, ezetimibe, Semaglutide(0.25 or 0.5MG /DOS), pantoprazole, fenofibrate, telmisartan, and metFORMIN.  No orders of the defined types were placed in this encounter.   There are no discontinued medications.  Follow-up: No follow-ups on file.   Sherlene Shamseresa L Kampbell Holaway, MD

## 2021-01-14 NOTE — Assessment & Plan Note (Signed)
He was referred to cardiology in August 2021 after a brief hospital admission for chest pain acc/ by nausea/vomiting.  He has a bifascicular block on EKG,  Normal ECHO, and low risk stress test. He did not follow up with cardiology for [facility].Marland Kitchen  He reports no repeat symptoms with use of PPI

## 2021-01-14 NOTE — Assessment & Plan Note (Signed)
He reports improved control  with Ozempic start.  He has lost 8 lbs and has had 2 hypoglycemic episodes.  Will reduce his evening Lispro dose from 8 units to 4 units.  If weight loss continues will change to Trulicity .  He has not followed up with his ophthalmologist for his retinopathy due to financial issues.  Continue statin, arb, return for fasting labs

## 2021-01-14 NOTE — Assessment & Plan Note (Signed)
Patient is taking his telmisartan,  Carvedilol and  notes no adverse effects.  Home BP readings have not been done,  But he is advised to start checking once weekly and notify office if they are consistently over  130/80 .  he is reminded to watch is salt intake and walking regularly about 3 times per week for exercise  .

## 2021-01-14 NOTE — Assessment & Plan Note (Signed)
He is tolerating low dose carvedilol started by cardiology without bradycardia

## 2021-01-20 ENCOUNTER — Other Ambulatory Visit: Payer: 59

## 2021-01-20 ENCOUNTER — Ambulatory Visit: Payer: 59 | Admitting: Pharmacist

## 2021-01-20 DIAGNOSIS — I10 Essential (primary) hypertension: Secondary | ICD-10-CM

## 2021-01-20 DIAGNOSIS — E782 Mixed hyperlipidemia: Secondary | ICD-10-CM

## 2021-01-20 DIAGNOSIS — E11319 Type 2 diabetes mellitus with unspecified diabetic retinopathy without macular edema: Secondary | ICD-10-CM

## 2021-01-20 DIAGNOSIS — IMO0002 Reserved for concepts with insufficient information to code with codable children: Secondary | ICD-10-CM

## 2021-01-20 NOTE — Chronic Care Management (AMB) (Addendum)
Care Management   Pharmacy Note  01/20/2021 Name: Randy Grant MRN: [id number] DOB: July 07, 1966  Subjective: Randy Grant is a 55 y.o. year old male who is a primary care patient of Randy Shams, MD. The Care Management team was consulted for assistance with care management and care coordination needs.    Engaged with patient by telephone for follow up visit in response to provider referral for pharmacy case management and/or care coordination services.   The patient was given information about Care Management services today including:  Care Management services includes personalized support from designated clinical staff supervised by the patient's primary care provider, including individualized plan of care and coordination with other care providers. 24/7 contact phone numbers for assistance for urgent and routine care needs. The patient may stop case management services at any time by phone call to the office staff.  Patient agreed to services and consent obtained.  Assessment:  Review of patient status, including review of consultants reports, laboratory and other test data, was performed as part of comprehensive evaluation and provision of chronic care management services.   SDOH (Social Determinants of Health) assessments and interventions performed:  SDOH Interventions    Flowsheet Row Most Recent Value  SDOH Interventions   Financial Strain Interventions Intervention Not Indicated        Objective:  Lab Results  Component Value Date   CREATININE 0.83 09/09/2020   CREATININE 0.74 03/21/2020   CREATININE 0.80 01/29/2020    Lab Results  Component Value Date   HGBA1C 6.8 (H) 09/09/2020       Component Value Date/Time   CHOL 173 09/16/2020 1223   TRIG 247.0 (H) 09/16/2020 1223   HDL 39.70 09/16/2020 1223   CHOLHDL 4 09/16/2020 1223   VLDL 49.4 (H) 09/16/2020 1223   LDLCALC 131 (H) 03/21/2020 2024   LDLDIRECT 111.0 09/16/2020 1223   Clinical ASCVD:  No  The 10-year ASCVD risk score Denman George DC Jr., et al., 2013) is: 11%   Values used to calculate the score:     Age: 65 years     Sex: Male     Is Non-Hispanic African American: No     Diabetic: Yes     Tobacco smoker: No     Systolic Blood Pressure: 112 mmHg     Is BP treated: Yes     HDL Cholesterol: 39.7 mg/dL     Total Cholesterol: 173 mg/dL    BP Readings from Last 3 Encounters:  01/13/21 112/80  09/16/20 126/66  04/01/20 140/70    Care Plan  No Known Allergies  Medications Reviewed Today     Reviewed by Lavona Mound, RPH (Pharmacist) on 01/20/21 at 1328  Med List Status: <None>   Medication Order Taking? Sig Documenting Provider Last Dose Status Informant  ACCU-CHEK GUIDE test strip [id number]  USE AS INSTRUCTED Randy Shams, MD  Active Self  aspirin 81 MG tablet [id number] Yes Take 81 mg by mouth daily. [provider] Taking Active Self  atorvastatin (LIPITOR) 40 MG tablet [id number] Yes TAKE 1 TABLET BY MOUTH DAILY. Randy Shams, MD Taking Active   B-D ULTRAFINE III SHORT PEN 31G X 8 MM MISC [id number]  TEST BLOOD SUGARS 2 TIMES DAILY Randy Shams, MD  Active Self  BAYER MICROLET LANCETS lancets [id number]  USE AS DIRECTED Randy Shams, MD  Active Self  carvedilol (COREG) 3.125 MG tablet [id number] Yes TAKE 1 TABLET BY MOUTH 2 TIMES DAILY WITH A MEAL. Darrick Huntsman,  Mar Daring, MD Taking Active   ezetimibe (ZETIA) 10 MG tablet [id number] Yes TAKE 1 TABLET BY MOUTH DAILY. Randy Shams, MD Taking Active   fenofibrate 160 MG tablet [id number] Yes TAKE 1 TABLET BY MOUTH AT BEDTIME. Randy Shams, MD Taking Active   glucose blood (ONE TOUCH ULTRA TEST) test strip [id number]  Use as instructed three times daily to check blood sugars Randy Shams, MD  Active Self  insulin glargine (SEMGLEE) 100 UNIT/ML Solostar Pen [id number] Yes Inject 45 Units into the skin daily. Randy Shams, MD Taking Active   Insulin Glargine-yfgn 100 UNIT/ML SOPN [id number] Yes INJECT 45 UNITS  INTO THE SKIN DAILY. Randy Shams, MD Taking Active   insulin lispro (HUMALOG KWIKPEN) 100 UNIT/ML KwikPen [id number] Yes Inject 8 Units into the skin 3 (three) times daily. Randy Shams, MD Taking Active            Med Note [facility], [name]   Mon Jan 20, 2021  1:26 PM) Taking 5 units w/meals   Insulin Pen Needle (B-D ULTRAFINE III SHORT PEN) 31G X 8 MM MISC [id number]  Test blood sugars 2 times daily Randy Shams, MD  Active Self  metFORMIN (GLUCOPHAGE-XR) 500 MG 24 hr tablet [id number] Yes TAKE 2 TABLETS BY MOUTH 2 TIMES DAILY. Randy Shams, MD Taking Active   Multiple Vitamin (MULTIVITAMIN) tablet [id number] Yes Take 1 tablet by mouth daily. [provider] Taking Active Self  pantoprazole (PROTONIX) 40 MG tablet [id number] Yes TAKE 1 TABLET BY MOUTH DAILY Randy Shams, MD Taking Active   Semaglutide,0.25 or 0.5MG /DOS, 2 MG/1.5ML SOPN [id number] Yes INJECT 0.5 MG INTO THE SKIN ONCE A WEEK. Randy Shams, MD Taking Active   telmisartan (MICARDIS) 40 MG tablet [id number] Yes TAKE 1 TABLET (40 MG TOTAL) BY MOUTH DAILY. Randy Shams, MD Taking Active             Patient Active Problem List   Diagnosis Date Noted   Vertigo 03/29/2020   Hospital discharge follow-up 03/29/2020   Chest pain 03/21/2020   Tachycardia 02/24/2020   Abnormal electrocardiogram 02/24/2020   Uncontrolled type 2 diabetes mellitus with hyperglycemia (HCC) 02/24/2020   Neuropathy associated with endocrine disorder (HCC) 01/29/2020   Dyspnea on exertion 12/28/2019   Anosmia 02/05/2018   Constipation 12/20/2016   Prostate cancer screening 10/17/2015   Gastritis 09/20/2014   Encounter for preventive health examination 09/20/2014   Need for immunization against malaria 01/20/2014   Erectile dysfunction associated with type 2 diabetes mellitus (HCC) 01/20/2014   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  Interventions: 1:1 collaboration with Randy Shams, MD regarding development and update of comprehensive plan of care as evidenced by provider attestation and co-signature Inter-disciplinary care team collaboration (see longitudinal plan of care) Comprehensive medication review performed; medication list updated in electronic medical record  Diabetes: Controlled w/A1c 6.8%; current treatment: metformin 1000 mg BID, Ozempic 0.5 mg weekly (Fridays), Semglee (insulin glargine) 30 units daily, Humalog 5 units TID with meals Denies hypoglycemia since reducing Humalog from 8 units to 5 units with meals Diet: salads, bread, chicken soup, limits meat, green tea w/splenda; avoiding added salt in diet  Exercise: walking regularly about 3 times per week Pt instructed to call if he experiences hypoglycemia events. Can consider discontinuing Humalog at next visit to prevent hypoglycemia and increase Ozempic to 1 mg weekly for additional post-prandial BG control.  Recommended to continue current regimen  Hypertension: Controlled per last visit; current treatment: telmisartan 40 mg daily (AM), carvedilol 3.125 mg BID Home BP readings: not checking Denies symptoms of hypotension/hypertension Recommended to continue current regimen  Hyperlipidemia and ASCVD risk reduction: Uncontrolled but likely improved since restarting ezetimibe; current treatment: atorvastatin 40 mg daily, ezetimibe  10 mg daily, fenofibrate 160 mg daily Fill history up to date for atorvastatin and ezetimibe. Recommend recheck lipid panel with next lab work and ensure patient is fasting. If TG severely elevated and risk of progression to pancreatitis, recommend reinitiation of fenofibrate. If TG acceptably controlled at that time, consider discontinuation. Could also consider Vascepa for superior CV risk reduction data, but would have to take pill burden into account in light of patient history of nonadherence.   Antiplatelet regimen: aspirin 81 mg daily Recommended to continue current regimen  GERD: Controlled per patient report. Current treatment: pantoprazole 40 mg daily Recommended to continue current regimen  Patient Goals/Self-Care Activities Over the next 90 days, patient will:  - take medications as prescribed check glucose twice daily, document, and provide at future appointments collaborate with provider on medication access solutions  Follow Up Plan: Telephone follow up appointment with care management team member scheduled for: ~  12 weeks     Medication Assistance:  None required.  Patient affirms current coverage meets needs.  Follow Up:  Patient agrees to Care Plan and Follow-up.  Plan: Telephone follow up appointment with care management team member scheduled for:  ~12 weeks  Fabio Neighbors, PharmD, BCPS PGY2 Ambulatory Care Resident [facility]   I was present for this visit and agree with the documentation by the resident as above.   Catie Feliz Beam, PharmD, [place], CPP Clinical Pharmacist [employer] [facility] [employer] [phone]

## 2021-01-20 NOTE — Patient Instructions (Signed)
Visit Information   Goals Addressed               This Visit's Progress     Medication Monitoring (pt-stated)        Patient Goals/Self-Care Activities Over the next 90 days, patient will:  - take medications as prescribed - check glucose BID, document, and provide at future appointments - collaborate with provider on medication access solutions.          Patient verbalizes understanding of instructions provided today and agrees to view in MyChart.   Telephone follow up appointment with care management team member scheduled for: ~12 weeks  Fabio Neighbors, PharmD, BCPS PGY2 Ambulatory Care Resident [facility]  Pharmacy

## 2021-01-23 ENCOUNTER — Other Ambulatory Visit: Payer: Self-pay

## 2021-01-23 ENCOUNTER — Other Ambulatory Visit: Payer: Self-pay | Admitting: Internal Medicine

## 2021-01-23 MED FILL — Semaglutide Soln Pen-inj 0.25 or 0.5 MG/DOSE (2 MG/1.5ML): SUBCUTANEOUS | 28 days supply | Qty: 1.5 | Fill #1 | Status: AC

## 2021-01-23 MED FILL — Metformin HCl Tab ER 24HR 500 MG: ORAL | 90 days supply | Qty: 360 | Fill #0 | Status: AC

## 2021-01-23 MED FILL — Carvedilol Tab 3.125 MG: ORAL | 30 days supply | Qty: 60 | Fill #0 | Status: AC

## 2021-01-23 MED FILL — Pantoprazole Sodium EC Tab 40 MG (Base Equiv): ORAL | 30 days supply | Qty: 30 | Fill #0 | Status: AC

## 2021-01-24 ENCOUNTER — Other Ambulatory Visit: Payer: Self-pay

## 2021-01-27 ENCOUNTER — Other Ambulatory Visit (INDEPENDENT_AMBULATORY_CARE_PROVIDER_SITE_OTHER): Payer: 59

## 2021-01-27 ENCOUNTER — Other Ambulatory Visit: Payer: Self-pay

## 2021-01-27 DIAGNOSIS — E11319 Type 2 diabetes mellitus with unspecified diabetic retinopathy without macular edema: Secondary | ICD-10-CM | POA: Diagnosis not present

## 2021-01-27 DIAGNOSIS — E1165 Type 2 diabetes mellitus with hyperglycemia: Secondary | ICD-10-CM | POA: Diagnosis not present

## 2021-01-27 DIAGNOSIS — IMO0002 Reserved for concepts with insufficient information to code with codable children: Secondary | ICD-10-CM

## 2021-01-27 LAB — HEMOGLOBIN A1C: Hgb A1c MFr Bld: 7.8 % — ABNORMAL HIGH (ref 4.6–6.5)

## 2021-01-27 LAB — COMPREHENSIVE METABOLIC PANEL
ALT: 17 U/L (ref 0–53)
AST: 14 U/L (ref 0–37)
Albumin: 4.4 g/dL (ref 3.5–5.2)
Alkaline Phosphatase: 51 U/L (ref 39–117)
BUN: 11 mg/dL (ref 6–23)
CO2: 27 mEq/L (ref 19–32)
Calcium: 9.5 mg/dL (ref 8.4–10.5)
Chloride: 104 mEq/L (ref 96–112)
Creatinine, Ser: 0.87 mg/dL (ref 0.40–1.50)
GFR: 96.21 mL/min (ref >60.00)
Glucose, Bld: 85 mg/dL (ref 70–99)
Potassium: 4.4 mEq/L (ref 3.5–5.1)
Sodium: 140 mEq/L (ref 135–145)
Total Bilirubin: 0.6 mg/dL (ref 0.2–1.2)
Total Protein: 6.7 g/dL (ref 6.0–8.3)

## 2021-01-27 LAB — LIPID PANEL
Cholesterol: 148 mg/dL (ref 0–200)
HDL: 42.8 mg/dL (ref >39.00)
LDL Cholesterol: 82 mg/dL (ref 0–99)
NonHDL: 105.61
Total CHOL/HDL Ratio: 3
Triglycerides: 120 mg/dL (ref 0.0–149.0)
VLDL: 24 mg/dL (ref 0.0–40.0)

## 2021-01-28 ENCOUNTER — Other Ambulatory Visit: Payer: Self-pay

## 2021-01-28 DIAGNOSIS — E11319 Type 2 diabetes mellitus with unspecified diabetic retinopathy without macular edema: Secondary | ICD-10-CM

## 2021-01-28 DIAGNOSIS — IMO0002 Reserved for concepts with insufficient information to code with codable children: Secondary | ICD-10-CM

## 2021-01-28 MED ORDER — INSULIN LISPRO (1 UNIT DIAL) 100 UNIT/ML (KWIKPEN)
5.0000 [IU] | PEN_INJECTOR | Freq: Three times a day (TID) | SUBCUTANEOUS | 2 refills | Status: DC
  Filled 2021-01-28: qty 15, 90d supply, fill #0

## 2021-01-28 MED ORDER — UNIFINE PENTIPS 31G X 8 MM MISC
5 refills | Status: DC
  Filled 2021-01-28: qty 100, 50d supply, fill #0
  Filled 2021-05-06: qty 100, 50d supply, fill #1
  Filled 2021-06-30: qty 100, 50d supply, fill #2
  Filled 2021-10-23: qty 100, 50d supply, fill #3

## 2021-01-30 ENCOUNTER — Telehealth: Payer: Self-pay

## 2021-01-30 NOTE — Telephone Encounter (Signed)
LMTCB in regards to lab results.  

## 2021-01-30 NOTE — Telephone Encounter (Signed)
Pt called returning your call 

## 2021-01-31 NOTE — Telephone Encounter (Signed)
See result note message 

## 2021-01-31 NOTE — Telephone Encounter (Signed)
LMTCB

## 2021-02-14 ENCOUNTER — Other Ambulatory Visit (HOSPITAL_COMMUNITY): Payer: Self-pay

## 2021-02-19 ENCOUNTER — Other Ambulatory Visit: Payer: Self-pay | Admitting: Internal Medicine

## 2021-02-19 ENCOUNTER — Other Ambulatory Visit: Payer: Self-pay

## 2021-02-19 MED FILL — Insulin Glargine-yfgn Soln Pen-Injector 100 Unit/ML: SUBCUTANEOUS | 90 days supply | Qty: 45 | Fill #0 | Status: CN

## 2021-02-19 MED FILL — Semaglutide Soln Pen-inj 0.25 or 0.5 MG/DOSE (2 MG/1.5ML): SUBCUTANEOUS | 28 days supply | Qty: 1.5 | Fill #2 | Status: AC

## 2021-02-20 ENCOUNTER — Other Ambulatory Visit: Payer: Self-pay

## 2021-02-20 MED ORDER — FENOFIBRATE 160 MG PO TABS
ORAL_TABLET | Freq: Every day | ORAL | 1 refills | Status: DC
  Filled 2021-02-20 – 2021-03-31 (×2): qty 90, 90d supply, fill #0
  Filled 2021-06-30: qty 90, 90d supply, fill #1

## 2021-02-20 MED ORDER — TELMISARTAN 40 MG PO TABS
ORAL_TABLET | Freq: Every day | ORAL | 1 refills | Status: DC
  Filled 2021-02-20: qty 90, 90d supply, fill #0

## 2021-03-10 ENCOUNTER — Other Ambulatory Visit: Payer: Self-pay

## 2021-03-10 ENCOUNTER — Telehealth: Payer: Self-pay | Admitting: Internal Medicine

## 2021-03-10 ENCOUNTER — Ambulatory Visit: Payer: 59 | Admitting: Internal Medicine

## 2021-03-10 ENCOUNTER — Encounter: Payer: Self-pay | Admitting: Internal Medicine

## 2021-03-10 VITALS — BP 150/86 | HR 85 | Temp 98.1°F | Ht 65.0 in | Wt 159.0 lb

## 2021-03-10 DIAGNOSIS — E349 Endocrine disorder, unspecified: Secondary | ICD-10-CM

## 2021-03-10 DIAGNOSIS — IMO0002 Reserved for concepts with insufficient information to code with codable children: Secondary | ICD-10-CM

## 2021-03-10 DIAGNOSIS — Z125 Encounter for screening for malignant neoplasm of prostate: Secondary | ICD-10-CM

## 2021-03-10 DIAGNOSIS — E1165 Type 2 diabetes mellitus with hyperglycemia: Secondary | ICD-10-CM | POA: Diagnosis not present

## 2021-03-10 DIAGNOSIS — E11319 Type 2 diabetes mellitus with unspecified diabetic retinopathy without macular edema: Secondary | ICD-10-CM

## 2021-03-10 DIAGNOSIS — I10 Essential (primary) hypertension: Secondary | ICD-10-CM | POA: Diagnosis not present

## 2021-03-10 DIAGNOSIS — G63 Polyneuropathy in diseases classified elsewhere: Secondary | ICD-10-CM

## 2021-03-10 MED ORDER — SEMAGLUTIDE(0.25 OR 0.5MG/DOS) 2 MG/1.5ML ~~LOC~~ SOPN
0.5000 mg | PEN_INJECTOR | SUBCUTANEOUS | 3 refills | Status: DC
  Filled 2021-03-10 – 2021-04-01 (×3): qty 4.5, 84d supply, fill #0
  Filled 2021-06-30: qty 4.5, 84d supply, fill #1
  Filled 2021-09-29: qty 4.5, 84d supply, fill #2
  Filled 2021-12-18: qty 4.5, 84d supply, fill #3

## 2021-03-10 MED ORDER — TELMISARTAN 80 MG PO TABS
80.0000 mg | ORAL_TABLET | Freq: Every day | ORAL | 1 refills | Status: DC
Start: 2021-03-10 — End: 2021-10-23
  Filled 2021-03-10 – 2021-03-31 (×2): qty 90, 90d supply, fill #0
  Filled 2021-05-06 – 2021-06-30 (×2): qty 90, 90d supply, fill #1

## 2021-03-10 MED ORDER — INSULIN LISPRO (1 UNIT DIAL) 100 UNIT/ML (KWIKPEN)
10.0000 [IU] | PEN_INJECTOR | Freq: Three times a day (TID) | SUBCUTANEOUS | 2 refills | Status: DC
  Filled 2021-03-10: qty 30, 100d supply, fill #0
  Filled 2021-03-31: qty 30, 90d supply, fill #0
  Filled 2021-03-31: qty 30, 100d supply, fill #0
  Filled 2021-06-30: qty 30, 90d supply, fill #1
  Filled 2021-10-23: qty 30, 90d supply, fill #2

## 2021-03-10 NOTE — Patient Instructions (Addendum)
Increase the humalog dose at lunchtime to 10 units.  Continue 8 units before the other 2 meals   Continue 45 units of the  Semgee (once daily insulib)   Continue taking Ozempic 0.5 mg .      Please increase your telmisartan dose to 80 mg daily .  You can take 2 of the 40 mg tablets daily ; new rx will be placed on file at pharmacy.       Return for labs in mid September

## 2021-03-10 NOTE — Assessment & Plan Note (Addendum)
Loss of control noted.  Increase lunchtime humalog to 10 units.  He has already resumed 8 units at evening meal . Continue 45 units of basal insulin and 0.5 mg ozempic .  Marland Kitchen  Foot exam done.  He will be contacted by [facility] to arrange  the diabetic eye exam that has has not had in years. Return for labs mid september

## 2021-03-10 NOTE — Assessment & Plan Note (Addendum)
Still not at goal on carvedilol 3.125 m bid and telmisartan 40 mg daily .  Increase telmisartan to 80 mg

## 2021-03-10 NOTE — Telephone Encounter (Signed)
  Please increase your telmisartan dose to 80 mg daily .  You can take 2 of the 40 mg tablets daily ; new rx will be placed on file at pharmacy.

## 2021-03-10 NOTE — Progress Notes (Signed)
Subjective:  Patient ID: Randy Grant, male    DOB: 08/04/1966  Age: 55 y.o. 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 Diagnoses of Neuropathy associated with endocrine disorder [facility](HCC), Essential hypertension, and Prostate cancer screening were also pertinent to this visit.  HPI Randy Grant presents for follow up on type 2 DM  This visit occurred during the SARS-CoV-2 public health emergency.  Safety protocols were in place, including screening questions prior to the visit, additional usage of staff PPE, and extensive cleaning of exam room while observing appropriate contact time as indicated for disinfecting solutions.   T2DM:   slight loss of control from January to June labs.  Attributes loss of control to his trip back home to [place]  because of mother's death .  April month long Ramadan fast also complicated control.  Checking sugars  inconsistently.  Post prandial 175 to 185.   Fastings have been 85 to 95 felt hungry but not shaky .  Weight has plateaued on 0.5 mg ozempic  )(weight is at goal)   Hypertension: patient checks blood pressure  ONCE  weekly at home.  Readings have been for the most part > 140/80 at rest . Patient is following a reduce salt diet most days and is taking medications as prescribed (TELMISARTAN 40,MG  CARVEDILOL 3.125MG   BID 0    Outpatient Medications Prior to Visit  Medication Sig Dispense Refill   ACCU-CHEK GUIDE test strip USE AS INSTRUCTED 100 strip 5   aspirin 81 MG tablet Take 81 mg by mouth daily.     atorvastatin (LIPITOR) 40 MG tablet TAKE 1 TABLET BY MOUTH DAILY. 90 tablet 1   B-D ULTRAFINE III SHORT PEN 31G X 8 MM MISC TEST BLOOD SUGARS 2 TIMES DAILY 100 each 6   BAYER MICROLET LANCETS lancets USE AS DIRECTED 100 each 5   carvedilol (COREG) 3.125 MG tablet TAKE 1 TABLET BY MOUTH 2 TIMES DAILY WITH A MEAL. 60 tablet 1   ezetimibe (ZETIA) 10 MG tablet TAKE 1 TABLET BY MOUTH  DAILY. 90 tablet 1   fenofibrate 160 MG tablet TAKE 1 TABLET BY MOUTH AT BEDTIME. 90 tablet 1   glucose blood (ONE TOUCH ULTRA TEST) test strip Use as instructed three times daily to check blood sugars 100 each 12   insulin glargine (SEMGLEE) 100 UNIT/ML Solostar Pen Inject 45 Units into the skin daily. 42 mL 3   Insulin Glargine-yfgn 100 UNIT/ML SOPN INJECT 45 UNITS INTO THE SKIN DAILY. 45 mL 3   Insulin Pen Needle (UNIFINE PENTIPS) 31G X 8 MM MISC test blood sugars 2 times a day 100 each 5   metFORMIN (GLUCOPHAGE-XR) 500 MG 24 hr tablet TAKE 2 TABLETS BY MOUTH 2 TIMES DAILY. 360 tablet 1   Multiple Vitamin (MULTIVITAMIN) tablet Take 1 tablet by mouth daily.     pantoprazole (PROTONIX) 40 MG tablet TAKE 1 TABLET BY MOUTH DAILY 30 tablet 2   insulin lispro (HUMALOG KWIKPEN) 100 UNIT/ML KwikPen Inject 5 Units into the skin 3 (three) times daily. 30 mL 2   Semaglutide,0.25 or 0.5MG /DOS, 2 MG/1.5ML SOPN INJECT 0.5 MG INTO THE SKIN ONCE A WEEK. 1.5 mL 3   telmisartan (MICARDIS) 40 MG tablet TAKE 1 TABLET (40 MG TOTAL) BY MOUTH DAILY. 90 tablet 1   No facility-administered medications prior to visit.    Review of Systems;  Patient denies headache, fevers, malaise, unintentional weight loss, skin rash, eye pain,  sinus congestion and sinus pain, sore throat, dysphagia,  hemoptysis , cough, dyspnea, wheezing, chest pain, palpitations, orthopnea, edema, abdominal pain, nausea, melena, diarrhea, constipation, flank pain, dysuria, hematuria, urinary  Frequency, nocturia, numbness, tingling, seizures,  Focal weakness, Loss of consciousness,  Tremor, insomnia, depression, anxiety, and suicidal ideation.      Objective:  BP (!) 150/86   Pulse 85   Temp 98.1 F (36.7 C)   Ht  (1.651 m)   Wt 159 lb (72.1 kg)   SpO2 98%   BMI 26.46 kg/m   BP Readings from Last 3 Encounters:  03/10/21 (!) 150/86  01/13/21 112/80  09/16/20 126/66    Wt Readings from Last 3 Encounters:  03/10/21 159 lb (72.1  kg)  01/13/21 155 lb 6.4 oz (70.5 kg)  09/16/20 162 lb 12.8 oz (73.8 kg)    General appearance: alert, cooperative and appears stated age Ears: normal TM's and external ear canals both ears Throat: lips, mucosa, and tongue normal; teeth and gums normal Neck: no adenopathy, no carotid bruit, supple, symmetrical, trachea midline and thyroid not enlarged, symmetric, no tenderness/mass/nodules Back: symmetric, no curvature. ROM normal. No CVA tenderness. Lungs: clear to auscultation bilaterally Heart: regular rate and rhythm, S1, S2 normal, no murmur, click, rub or gallop Abdomen: soft, non-tender; bowel sounds normal; no masses,  no organomegaly Pulses: 2+ and symmetric Skin: Skin color, texture, turgor normal. No rashes or lesions Lymph nodes: Cervical, supraclavicular, and axillary nodes normal.  Lab Results  Component Value Date   HGBA1C 7.8 (H) 01/27/2021   HGBA1C 6.8 (H) 09/09/2020   HGBA1C 9.3 (H) 01/01/2020    Lab Results  Component Value Date   CREATININE 0.87 01/27/2021   CREATININE 0.83 09/09/2020   CREATININE 0.74 03/21/2020    Lab Results  Component Value Date   WBC 5.1 03/23/2020   HGB 13.4 03/23/2020   HCT 41.2 03/23/2020   PLT 316 03/23/2020   GLUCOSE 85 01/27/2021   CHOL 148 01/27/2021   TRIG 120.0 01/27/2021   HDL 42.80 01/27/2021   LDLDIRECT 111.0 09/16/2020   LDLCALC 82 01/27/2021   ALT 17 01/27/2021   AST 14 01/27/2021   NA 140 01/27/2021   K 4.4 01/27/2021   CL 104 01/27/2021   CREATININE 0.87 01/27/2021   BUN 11 01/27/2021   CO2 27 01/27/2021   TSH 1.16 03/29/2017   PSA 0.55 01/29/2020   INR 1.0 03/21/2020   HGBA1C 7.8 (H) 01/27/2021   MICROALBUR 2.0 (H) 09/09/2020    NM Myocar Multi W/Spect W/Wall Motion / EF  Result Date: 03/22/2020 Pharmacological myocardial perfusion imaging study with no significant  ischemia Normal wall motion, EF estimated at 65% No EKG changes concerning for ischemia at peak stress or in recovery. Low risk scan  Signed, Dossie Arbour, MD, Ph.D [facility]   ECHOCARDIOGRAM COMPLETE  Result Date: 03/22/2020    ECHOCARDIOGRAM REPORT   Patient Name:   Randy Grant Date of Exam: 03/22/2020 Medical Rec #:  [id number]          Height:       66.0 in Accession #:    [id number]         Weight:       158.1 lb Date of Birth:  31-Jul-1966           BSA:          1.810 m Patient Age:    54 years           BP:  117/78 mmHg Patient Gender: M                  HR:           58 bpm. Exam Location:  [facility] Procedure: 2D Echo, Color Doppler and Cardiac Doppler Indications:     R07.9 Chest Pain  History:         Patient has no prior history of Echocardiogram examinations.                  Risk Factors:Hypertension, Diabetes and Dyslipidemia.  Sonographer:     Humphrey Rolls RDCS (AE) Referring Phys:  [id number] Antonieta Iba Diagnosing Phys: Julien Nordmann MD  Sonographer Comments: Suboptimal subcostal window. IMPRESSIONS  1. Left ventricular ejection fraction, by estimation, is 60 to 65%. The left ventricle has normal function. The left ventricle has no regional wall motion abnormalities. Left ventricular diastolic parameters are consistent with Grade I diastolic dysfunction (impaired relaxation).  2. Right ventricular systolic function is normal. The right ventricular size is normal. Tricuspid regurgitation signal is inadequate for assessing PA pressure. FINDINGS  Left Ventricle: Left ventricular ejection fraction, by estimation, is 60 to 65%. The left ventricle has normal function. The left ventricle has no regional wall motion abnormalities. The left ventricular internal cavity size was normal in size. There is  no left ventricular hypertrophy. Left ventricular diastolic parameters are consistent with Grade I diastolic dysfunction (impaired relaxation). Right Ventricle: The right ventricular size is normal. No increase in right ventricular wall thickness. Right ventricular systolic function is normal. Tricuspid regurgitation signal is  inadequate for assessing PA pressure. Left Atrium: Left atrial size was normal in size. Right Atrium: Right atrial size was normal in size. Pericardium: There is no evidence of pericardial effusion. Mitral Valve: The mitral valve is normal in structure. Normal mobility of the mitral valve leaflets. Mild mitral valve regurgitation. No evidence of mitral valve stenosis. MV peak gradient, 3.4 mmHg. The mean mitral valve gradient is 1.0 mmHg. Tricuspid Valve: The tricuspid valve is normal in structure. Tricuspid valve regurgitation is not demonstrated. No evidence of tricuspid stenosis. Aortic Valve: The aortic valve is normal in structure. Aortic valve regurgitation is not visualized. No aortic stenosis is present. Aortic valve mean gradient measures 3.0 mmHg. Aortic valve peak gradient measures 5.6 mmHg. Aortic valve area, by VTI measures 2.94 cm. Pulmonic Valve: The pulmonic valve was normal in structure. Pulmonic valve regurgitation is not visualized. No evidence of pulmonic stenosis. Aorta: The aortic root is normal in size and structure. Venous: The inferior vena cava is normal in size with greater than 50% respiratory variability, suggesting right atrial pressure of 3 mmHg. IAS/Shunts: No atrial level shunt detected by color flow Doppler.  LEFT VENTRICLE PLAX 2D LVIDd:         4.18 cm  Diastology LVIDs:         2.43 cm  LV e' lateral:   11.20 cm/s LV PW:         1.09 cm  LV E/e' lateral: 8.0 LV IVS:        0.75 cm  LV e' medial:    7.94 cm/s LVOT diam:     2.00 cm  LV E/e' medial:  11.3 LV SV:         63 LV SV Index:   35 LVOT Area:     3.14 cm  RIGHT VENTRICLE RV Basal diam:  2.38 cm LEFT ATRIUM  Index       RIGHT ATRIUM           Index LA diam:        3.90 cm 2.16 cm/m  RA Area:     11.40 cm LA Vol (A2C):   38.7 ml 21.39 ml/m RA Volume:   22.70 ml  12.54 ml/m LA Vol (A4C):   18.3 ml 10.11 ml/m LA Biplane Vol: 28.9 ml 15.97 ml/m  AORTIC VALVE                   PULMONIC VALVE AV Area (Vmax):     2.57 cm    PV Vmax:       1.13 m/s AV Area (Vmean):   2.79 cm    PV Vmean:      73.800 cm/s AV Area (VTI):     2.94 cm    PV VTI:        0.215 m AV Vmax:           118.00 cm/s PV Peak grad:  5.1 mmHg AV Vmean:          73.100 cm/s PV Mean grad:  2.0 mmHg AV VTI:            0.214 m AV Peak Grad:      5.6 mmHg AV Mean Grad:      3.0 mmHg LVOT Vmax:         96.60 cm/s LVOT Vmean:        65.000 cm/s LVOT VTI:          0.200 m LVOT/AV VTI ratio: 0.93  AORTA Ao Root diam: 2.70 cm MITRAL VALVE MV Area (PHT): 4.04 cm    SHUNTS MV Peak grad:  3.4 mmHg    Systemic VTI:  0.20 m MV Mean grad:  1.0 mmHg    Systemic Diam: 2.00 cm MV Vmax:       0.92 m/s MV Vmean:      50.9 cm/s MV Decel Time: 188 msec MV E velocity: 90.00 cm/s MV A velocity: 82.70 cm/s MV E/A ratio:  1.09 Julien Nordmann MD Electronically signed by Julien Nordmann MD Signature Date/Time: 03/22/2020/1:29:51 PM    Final     Assessment & Plan:   Problem List Items Addressed This Visit       Unprioritized   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  Increase lunchtime humalog to 10 units.  He has already resumed 8 units at evening meal . Continue 45 units of basal insulin and 0.5 mg ozempic .  Marland Kitchen  Foot exam done.  He will be contacted by [facility] to arrange  the diabetic eye exam that has has not had in years. Return for labs mid september       Relevant Medications   insulin lispro (HUMALOG KWIKPEN) 100 UNIT/ML KwikPen   Semaglutide,0.25 or 0.5MG /DOS, 2 MG/1.5ML SOPN   telmisartan (MICARDIS) 80 MG tablet   Other Relevant Orders   Hemoglobin A1c   Comprehensive metabolic panel   Lipid panel   Essential hypertension    Still not at goal on carvedilol 3.125 m bid and telmisartan 40 mg daily .  Increase telmisartan to 80 mg        Relevant Medications   telmisartan (MICARDIS) 80 MG tablet   Prostate cancer screening    He is overdue for prostate and Colon ca screening.  PSA will be done with next set  of  labs and next visit will be in November for CPE       Relevant Orders   PSA   Neuropathy associated with endocrine disorder (HCC)    Meds ordered this encounter  Medications   insulin lispro (HUMALOG KWIKPEN) 100 UNIT/ML KwikPen    Sig: Inject 10 Units into the skin 3 (three) times daily.    Dispense:  30 mL    Refill:  2    90 day supply   Semaglutide,0.25 or 0.5MG /DOS, 2 MG/1.5ML SOPN    Sig: INJECT 0.5 MG INTO THE SKIN ONCE A WEEK.    Dispense:  4.5 mL    Refill:  3    90 day supply   telmisartan (MICARDIS) 80 MG tablet    Sig: Take 1 tablet (80 mg total) by mouth daily.    Dispense:  90 tablet    Refill:  1    NOTE DOSE INCREASE.  KEEP ON FILE FOR FUTURE REFILL    Medications Discontinued During This Encounter  Medication Reason   insulin lispro (HUMALOG KWIKPEN) 100 UNIT/ML KwikPen    Semaglutide,0.25 or 0.5MG /DOS, 2 MG/1.5ML SOPN Reorder   telmisartan (MICARDIS) 40 MG tablet     I provided  30 minutes  during this encounter reviewing patient's recent a1c , discussing the potential causes of his loss of glycemic control,   providing counseling on the his diabetes ad hypertension management , and arranging for follow up .    Follow-up: Return in about 3 months (around 06/10/2021) for follow up diabetes.   Sherlene Shams, MD

## 2021-03-10 NOTE — Assessment & Plan Note (Signed)
He is overdue for prostate and Colon ca screening.  PSA will be done with next set of labs and next visit will be in November for CPE

## 2021-03-11 NOTE — Telephone Encounter (Signed)
LMTCB

## 2021-03-13 ENCOUNTER — Other Ambulatory Visit: Payer: Self-pay

## 2021-03-13 NOTE — Telephone Encounter (Signed)
LMTCB

## 2021-03-24 ENCOUNTER — Other Ambulatory Visit: Payer: Self-pay

## 2021-03-28 ENCOUNTER — Ambulatory Visit: Payer: 59 | Admitting: Pharmacist

## 2021-03-28 DIAGNOSIS — E782 Mixed hyperlipidemia: Secondary | ICD-10-CM

## 2021-03-28 DIAGNOSIS — E11319 Type 2 diabetes mellitus with unspecified diabetic retinopathy without macular edema: Secondary | ICD-10-CM

## 2021-03-28 DIAGNOSIS — IMO0002 Reserved for concepts with insufficient information to code with codable children: Secondary | ICD-10-CM

## 2021-03-28 DIAGNOSIS — I10 Essential (primary) hypertension: Secondary | ICD-10-CM

## 2021-03-28 NOTE — Chronic Care Management (AMB) (Signed)
Care Management   Pharmacy Note  03/28/2021 Name: Randy Grant MRN: [id number] DOB: 1966/02/15  Subjective: Randy Grant is a 55 y.o. year old male who is a primary care patient of Sherlene Shams, MD. The Care Management team was consulted for assistance with care management and care coordination needs.    Care coordination for  medication access  in response to provider referral for pharmacy case management and/or care coordination services.   The patient was given information about Care Management services today including:  Care Management services includes personalized support from designated clinical staff supervised by the patient's primary care provider, including individualized plan of care and coordination with other care providers. 24/7 contact phone numbers for assistance for urgent and routine care needs. The patient may stop case management services at any time by phone call to the office staff.  Patient agreed to services and consent obtained.  Assessment:  Review of patient status, including review of consultants reports, laboratory and other test data, was performed as part of comprehensive evaluation and provision of chronic care management services.   SDOH (Social Determinants of Health) assessments and interventions performed:  none  Objective:  Lab Results  Component Value Date   CREATININE 0.87 01/27/2021   CREATININE 0.83 09/09/2020   CREATININE 0.74 03/21/2020    Lab Results  Component Value Date   HGBA1C 7.8 (H) 01/27/2021       Component Value Date/Time   CHOL 148 01/27/2021 1022   TRIG 120.0 01/27/2021 1022   HDL 42.80 01/27/2021 1022   CHOLHDL 3 01/27/2021 1022   VLDL 24.0 01/27/2021 1022   LDLCALC 82 01/27/2021 1022   LDLDIRECT 111.0 09/16/2020 1223     Clinical ASCVD: No  The 10-year ASCVD risk score Denman George DC Jr., et al., 2013) is: 15.8%   Values used to calculate the score:     Age: 8 years     Sex: Male     Is  Non-Hispanic African American: No     Diabetic: Yes     Tobacco smoker: No     Systolic Blood Pressure: 150 mmHg     Is BP treated: Yes     HDL Cholesterol: 42.8 mg/dL     Total Cholesterol: 148 mg/dL     BP Readings from Last 3 Encounters:  03/10/21 (!) 150/86  01/13/21 112/80  09/16/20 126/66    Care Plan  No Known Allergies  Medications Reviewed Today     Reviewed by Sherley Bounds, CMA (Certified Medical Assistant) on 03/10/21 at 1150  Med List Status: <None>   Medication Order Taking? Sig Documenting Provider Last Dose Status Informant  ACCU-CHEK GUIDE test strip [id number]  USE AS INSTRUCTED Sherlene Shams, MD  Active Self  aspirin 81 MG tablet [id number]  Take 81 mg by mouth daily. [provider]  Active Self  atorvastatin (LIPITOR) 40 MG tablet [id number]  TAKE 1 TABLET BY MOUTH DAILY. Sherlene Shams, MD  Active   B-D ULTRAFINE III SHORT PEN 31G X 8 MM MISC [id number]  TEST BLOOD SUGARS 2 TIMES DAILY Sherlene Shams, MD  Active Self  BAYER MICROLET LANCETS lancets [id number]  USE AS DIRECTED Sherlene Shams, MD  Active Self  carvedilol (COREG) 3.125 MG tablet [id number]  TAKE 1 TABLET BY MOUTH 2 TIMES DAILY WITH A MEAL. Sherlene Shams, MD  Active   ezetimibe (ZETIA) 10 MG tablet [id number]  TAKE 1 TABLET BY MOUTH DAILY. Sherlene Shams, MD  Active   fenofibrate 160 MG tablet [id number]  TAKE 1 TABLET BY MOUTH AT BEDTIME. Sherlene Shams, MD  Active   glucose blood (ONE TOUCH ULTRA TEST) test strip [id number]  Use as instructed three times daily to check blood sugars Sherlene Shams, MD  Active Self  insulin glargine (SEMGLEE) 100 UNIT/ML Solostar Pen [id number]  Inject 45 Units into the skin daily. Sherlene Shams, MD  Active   Insulin Glargine-yfgn 100 UNIT/ML SOPN [id number]  INJECT 45 UNITS INTO THE SKIN DAILY. Sherlene Shams, MD  Active   insulin lispro (HUMALOG KWIKPEN) 100 UNIT/ML KwikPen [id number]  Inject 5 Units into the skin 3 (three) times daily. Sherlene Shams, MD  Active   Insulin Pen Needle (UNIFINE PENTIPS) 31G X 8 MM MISC [id number]  test blood sugars 2 times a day Sherlene Shams, MD  Active   metFORMIN (GLUCOPHAGE-XR) 500 MG 24 hr tablet [id number]  TAKE 2 TABLETS BY MOUTH 2 TIMES DAILY. Sherlene Shams, MD  Active   Multiple Vitamin (MULTIVITAMIN) tablet [id number]  Take 1 tablet by mouth daily. [provider]  Active Self  pantoprazole (PROTONIX) 40 MG tablet [id number]  TAKE 1 TABLET BY MOUTH DAILY Sherlene Shams, MD  Active   Semaglutide,0.25 or 0.5MG /DOS, 2 MG/1.5ML SOPN [id number]  INJECT 0.5 MG INTO THE SKIN ONCE A WEEK. Sherlene Shams, MD  Active   telmisartan (MICARDIS) 40 MG tablet [id number]  TAKE 1 TABLET (40 MG TOTAL) BY MOUTH DAILY. Sherlene Shams, MD  Active             Patient Active Problem List   Diagnosis Date Noted   Vertigo 03/29/2020   Hospital discharge follow-up 03/29/2020   Chest pain 03/21/2020   Tachycardia 02/24/2020   Abnormal electrocardiogram 02/24/2020   Uncontrolled type 2 diabetes mellitus with hyperglycemia (HCC) 02/24/2020   Neuropathy associated with endocrine disorder (HCC) 01/29/2020   Dyspnea on exertion 12/28/2019   Anosmia 02/05/2018   Constipation 12/20/2016   Prostate cancer screening 10/17/2015   Gastritis 09/20/2014   Encounter for preventive health examination 09/20/2014   Need for immunization against malaria 01/20/2014   Erectile dysfunction associated with type 2 diabetes mellitus (HCC) 01/20/2014   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) 04/25/2007   Mixed hyperlipidemia 04/25/2007   Essential hypertension 04/25/1999    Conditions to be addressed/monitored: HTN, HLD, and DMII  Care Plan : Medication Management  Updates made by Lourena Simmonds, RPH-CPP since 03/28/2021 12:00 AM     Problem: Diabetes, HLD      Long-Range Goal: Disease Progression Prevention   This Visit's Progress: On track  Recent Progress: On track  Priority: High  Note:    Current Barriers:  Unable to achieve control of diabetes   Pharmacist Clinical Goal(s):  Over the next 90 days, patient will achieve control of diabetes as evidenced by A1c  through collaboration with PharmD and provider.  Interventions: 1:1 collaboration with Sherlene Shams, MD regarding development and update of comprehensive plan of care as evidenced by provider attestation and co-signature Inter-disciplinary care team collaboration (see longitudinal plan of care) Comprehensive medication review performed; medication list updated in electronic medical record  Diabetes: Controlled w/A1c 6.8%; current treatment: metformin 1000 mg BID, Ozempic 0.5 mg weekly (Fridays), Semglee (insulin glargine) 30 units daily, Humalog 5 units TID with meals Received PA request for Dexcom transmitter and sensors. Completed via Cover My Meds  Hypertension: Controlled per last visit; current treatment: telmisartan  80 mg daily, carvedilol 3.125 mg BID Home BP readings: not checking Denies symptoms of hypotension/hypertension Previously recommended to continue current regimen  Hyperlipidemia and ASCVD risk reduction: At goal; current treatment: atorvastatin 40 mg daily, ezetimibe 10 mg daily, fenofibrate 160 mg daily Antiplatelet regimen: aspirin 81 mg daily Previously recommended to continue current regimen at this time  GERD: Controlled per patient report. Current treatment: pantoprazole 40 mg daily Previously recommended to continue current regimen along with avoidance of trigger foods.   Patient Goals/Self-Care Activities Over the next 90 days, patient will:  - take medications as prescribed check glucose twice daily, document, and provide at future appointments collaborate with provider on medication access solutions  Follow Up Plan: Telephone follow up appointment with care management team member scheduled for: ~  4 weeks as previously scheduled      Medication Assistance:  None required.   Patient affirms current coverage meets needs.  Follow Up:  Patient agrees to Care Plan and Follow-up.  Plan: Telephone follow up appointment with care management team member scheduled for:  ~ 4 weeks as previously scheduled  Catie Feliz Beam, PharmD, [place], CPP Clinical Pharmacist [employer] [facility] [employer] [phone]

## 2021-03-28 NOTE — Patient Instructions (Signed)
Visit Information   Goals Addressed               This Visit's Progress     Patient Stated     Medication Monitoring (pt-stated)        Patient Goals/Self-Care Activities Over the next 90 days, patient will:  - take medications as prescribed - check glucose BID, document, and provide at future appointments - collaborate with provider on medication access solutions.        Patient verbalizes understanding of instructions provided today and agrees to view in MyChart.  Plan: Telephone follow up appointment with care management team member scheduled for:  ~ 4 weeks as previously scheduled  Catie Feliz Beam, PharmD, [place], CPP Clinical Pharmacist [employer] [facility] [employer] [phone]

## 2021-03-31 ENCOUNTER — Other Ambulatory Visit: Payer: Self-pay

## 2021-03-31 ENCOUNTER — Other Ambulatory Visit: Payer: Self-pay | Admitting: Internal Medicine

## 2021-03-31 MED FILL — Pantoprazole Sodium EC Tab 40 MG (Base Equiv): ORAL | 30 days supply | Qty: 30 | Fill #1 | Status: AC

## 2021-03-31 MED FILL — Insulin Glargine-yfgn Soln Pen-Injector 100 Unit/ML: SUBCUTANEOUS | 90 days supply | Qty: 45 | Fill #0 | Status: AC

## 2021-04-01 ENCOUNTER — Other Ambulatory Visit: Payer: Self-pay

## 2021-04-01 MED ORDER — ATORVASTATIN CALCIUM 40 MG PO TABS
ORAL_TABLET | Freq: Every day | ORAL | 1 refills | Status: DC
  Filled 2021-04-01: qty 90, 90d supply, fill #0
  Filled 2021-05-06 – 2021-06-30 (×2): qty 90, 90d supply, fill #1

## 2021-04-01 NOTE — Telephone Encounter (Signed)
LMTCB

## 2021-04-03 ENCOUNTER — Other Ambulatory Visit: Payer: Self-pay

## 2021-04-03 NOTE — Telephone Encounter (Signed)
Patient is returning your call,he stated to call him after 2 pm because he works in the morning.

## 2021-04-04 ENCOUNTER — Other Ambulatory Visit: Payer: Self-pay

## 2021-04-10 NOTE — Telephone Encounter (Signed)
Attempted to call pt multiple times. Now mail box is full. Mailed an unable to reach letter to pt.

## 2021-04-16 ENCOUNTER — Telehealth: Payer: Self-pay

## 2021-04-16 NOTE — Telephone Encounter (Signed)
Dexcom G6 transmitter and receiver have both been approved through 03/29/2022.

## 2021-04-21 ENCOUNTER — Telehealth: Payer: 59

## 2021-04-21 ENCOUNTER — Telehealth: Payer: Self-pay | Admitting: Pharmacist

## 2021-04-21 NOTE — Telephone Encounter (Signed)
Appointment rescheduled.

## 2021-04-21 NOTE — Telephone Encounter (Signed)
  Chronic Care Management   Note  04/21/2021 Name: NABOR THOMANN MRN: [id number] DOB: Apr 15, 1966   Attempted to contact patient for scheduled appointment for medication management support. Left HIPAA compliant message for patient to return my call at their convenience.    Plan: - If I do not hear back from the patient by end of business today, will collaborate with Care Guide to outreach to schedule follow up with me  Catie Feliz Beam, PharmD, [place], CPP Clinical Pharmacist [facility] [employer] [phone]

## 2021-04-21 NOTE — Telephone Encounter (Signed)
Spoke with patient. Reviewed to increase telmisartan dose. He verbalized understanding. Will follow up at [facility] appointment in 2 weeks.

## 2021-05-05 ENCOUNTER — Telehealth: Payer: 59

## 2021-05-06 ENCOUNTER — Other Ambulatory Visit: Payer: Self-pay

## 2021-05-06 ENCOUNTER — Other Ambulatory Visit: Payer: Self-pay | Admitting: Internal Medicine

## 2021-05-06 DIAGNOSIS — E782 Mixed hyperlipidemia: Secondary | ICD-10-CM

## 2021-05-06 MED FILL — Ezetimibe Tab 10 MG: ORAL | 90 days supply | Qty: 90 | Fill #0 | Status: CN

## 2021-05-07 ENCOUNTER — Other Ambulatory Visit: Payer: Self-pay

## 2021-05-12 ENCOUNTER — Telehealth: Payer: 59

## 2021-05-12 ENCOUNTER — Telehealth: Payer: Self-pay | Admitting: Internal Medicine

## 2021-05-12 NOTE — Telephone Encounter (Signed)
Yes, called patient for our scheduled phone appointment.   Attempted to call patient back. Call went straight to voicemail

## 2021-05-12 NOTE — Telephone Encounter (Signed)
Patient is returning your call,please call the patient back at [phone].

## 2021-05-12 NOTE — Telephone Encounter (Signed)
Did you happen to call pt?

## 2021-05-13 NOTE — Telephone Encounter (Signed)
  Chronic Care Management   Note  05/13/2021 Name: DAEVEON ZWEBER MRN: [id number] DOB: 08-10-1966   Attempted to contact patient for scheduled appointment for medication management support. Left HIPAA compliant message for patient to return my call at their convenience.    Plan: - If I do not hear back from the patient by end of business today, will collaborate with Care Guide to outreach to schedule follow up with me   Catie Feliz Beam, PharmD, [place], CPP Clinical Pharmacist [facility] [employer] [phone]

## 2021-05-20 ENCOUNTER — Other Ambulatory Visit: Payer: Self-pay

## 2021-05-22 ENCOUNTER — Telehealth: Payer: Self-pay

## 2021-05-22 NOTE — Chronic Care Management (AMB) (Signed)
  Care Management   Note  05/22/2021 Name: Randy Grant MRN: [id number] DOB: 1966-03-15  Randy Grant is a 55 y.o. year old male who is a primary care patient of Darrick Huntsman, Mar Daring, MD and is actively engaged with the care management team. I reached out to Randy Grant by phone today to assist with re-scheduling a follow up visit with the Pharmacist  Follow up plan: Unsuccessful telephone outreach attempt made. A HIPAA compliant phone message was left for the patient providing contact information and requesting a return call.  The care management team will reach out to the patient again over the next 7 days.  If patient returns call to provider office, please advise to call Embedded Care Management Care Guide Penne Lash  at [phone]  Penne Lash, RMA Care Guide, Embedded Care Coordination [facility]  [place], [place] [address] Direct Dial: [phone] [email or web address] Website: [email or web address]

## 2021-06-10 NOTE — Chronic Care Management (AMB) (Signed)
  Care Management   Note  06/10/2021 Name: Randy Grant MRN: [id number] DOB: [birth year]  [name] is a 55 y.o. year old male who is a primary care patient of Darrick Huntsman, Mar Daring, MD and is actively engaged with the care management team. I reached out to [name] by phone today to assist with re-scheduling a follow up visit with the Pharmacist  Follow up plan: Unsuccessful telephone outreach attempt made. A HIPAA compliant phone message was left for the patient providing contact information and requesting a return call.  The care management team will reach out to the patient again over the next 7 days.  If patient returns call to provider office, please advise to call Embedded Care Management Care Guide Penne Lash  at [phone]  Penne Lash, RMA Care Guide, Embedded Care Coordination [facility]  [place], [place] [address] Direct Dial: [phone] [email or web address] Website: [email or web address]

## 2021-06-17 NOTE — Chronic Care Management (AMB) (Signed)
  Care Management   Note  06/17/2021 Name: Randy Grant MRN: [id number] DOB: Dec 03, 1965  [name] is a 55 y.o. year old male who is a primary care patient of Darrick Huntsman, Mar Daring, MD and is actively engaged with the care management team. I reached out to Merric E Pal by phone today to assist with re-scheduling a follow up visit with the Pharmacist  Follow up plan: Unable to make contact on outreach attempts x 3. PCP Sherlene Shams, MD notified via routed documentation in medical record.   Penne Lash, RMA Care Guide, Embedded Care Coordination [facility]  [place], [place] [address] Direct Dial: [phone] [email or web address] Website: [email or web address]

## 2021-06-30 ENCOUNTER — Other Ambulatory Visit: Payer: Self-pay

## 2021-06-30 MED FILL — Pantoprazole Sodium EC Tab 40 MG (Base Equiv): ORAL | 30 days supply | Qty: 30 | Fill #2 | Status: AC

## 2021-06-30 MED FILL — Insulin Glargine-yfgn Soln Pen-Injector 100 Unit/ML: SUBCUTANEOUS | 90 days supply | Qty: 45 | Fill #1 | Status: AC

## 2021-07-14 ENCOUNTER — Telehealth: Payer: 59

## 2021-07-14 ENCOUNTER — Telehealth: Payer: Self-pay | Admitting: Pharmacist

## 2021-07-14 NOTE — Telephone Encounter (Signed)
  Chronic Care Management   Note  07/14/2021 Name: CORIAN HANDLEY MRN: [id number] DOB: 1966/08/05   Attempted to contact patient for scheduled appointment for medication management support. Left HIPAA compliant message for patient to return my call at their convenience.    Plan: - If I do not hear back from the patient by end of business today, will collaborate with Care Guide to outreach to schedule follow up with me  Catie Feliz Beam, PharmD, [place], CPP Clinical Pharmacist [facility] [employer] [phone]

## 2021-07-17 ENCOUNTER — Telehealth: Payer: Self-pay

## 2021-07-17 NOTE — Chronic Care Management (AMB) (Signed)
  Care Management   Note  07/17/2021 Name: Randy Grant MRN: [id number] DOB: 04/29/1966  Randy Grant is a 55 y.o. year old male who is a primary care patient of Darrick Huntsman, Mar Daring, MD and is actively engaged with the care management team. I reached out to Tiras E Frerichs by phone today to assist with re-scheduling a follow up visit with the Pharmacist  Follow up plan: Unsuccessful telephone outreach attempt made. A HIPAA compliant phone message was left for the patient providing contact information and requesting a return call.  The care management team will reach out to the patient again over the next 7 days.  If patient returns call to provider office, please advise to call Embedded Care Management Care Guide Penne Lash  at [phone]  Penne Lash, RMA Care Guide, Embedded Care Coordination [facility]  [place], [place] [address] Direct Dial: [phone] [email or web address]

## 2021-07-24 NOTE — Chronic Care Management (AMB) (Signed)
°  Care Management   Note  07/24/2021 Name: CLAUS SILVESTRO MRN: [id number] DOB: [birth year]  [name] is a 55 y.o. year old male who is a primary care patient of Darrick Huntsman, Mar Daring, MD and is actively engaged with the care management team. I reached out to Damare E Grisanti by phone today to assist with re-scheduling a follow up visit with the Pharmacist  Follow up plan: Unsuccessful telephone outreach attempt made. A HIPAA compliant phone message was left for the patient providing contact information and requesting a return call.  The care management team will reach out to the patient again over the next 7 days.  If patient returns call to provider office, please advise to call Embedded Care Management Care Guide Penne Lash  at [phone]  Penne Lash, RMA Care Guide, Embedded Care Coordination [facility]  [place], [place] [address] Direct Dial: [phone] [email or web address] Website: [email or web address]

## 2021-07-31 NOTE — Telephone Encounter (Signed)
3rd unsuccessful outreach  

## 2021-07-31 NOTE — Chronic Care Management (AMB) (Signed)
°  Care Management   Note  07/31/2021 Name: [name] MRN: [id number] DOB: January 26, 1966  Randy Grant is a 55 y.o. year old male who is a primary care patient of Darrick Huntsman, Mar Daring, MD and is actively engaged with the care management team. I reached out to Chase E Ayad by phone today to assist with re-scheduling a follow up visit with the Pharmacist  Follow up plan: Unable to make contact on outreach attempts x 3. PCP Sherlene Shams, MD notified via routed documentation in medical record.   Penne Lash, RMA Care Guide, Embedded Care Coordination [facility]  [place], [place] [address] Direct Dial: [phone] [email or web address] Website: [email or web address]

## 2021-08-12 ENCOUNTER — Telehealth: Payer: Self-pay | Admitting: Internal Medicine

## 2021-08-12 NOTE — Telephone Encounter (Signed)
Needs Follow up for diabetes with PCP scheduled and needs to discuss [place]  study program at appointment. Please advise when scheduled.

## 2021-09-29 ENCOUNTER — Other Ambulatory Visit: Payer: Self-pay

## 2021-10-13 ENCOUNTER — Ambulatory Visit: Payer: Self-pay | Admitting: Pharmacist

## 2021-10-13 NOTE — Chronic Care Management (AMB) (Signed)
?  Chronic Care Management  ? ?Note ? ?10/13/2021 ?Name: BERNADETTE ARMIJO MRN: [id number] DOB: 08/09/1966 ? ? ? ?Closing pharmacy CCM case at this time.  Patient has clinic contact information for future questions or concerns.  ? ?Catie Feliz Beam, PharmD, [place], CPP ?Clinical Pharmacist ?Nature conservation officer at [employer] ?[phone] ? ?

## 2021-10-23 ENCOUNTER — Other Ambulatory Visit: Payer: Self-pay

## 2021-10-23 ENCOUNTER — Other Ambulatory Visit: Payer: Self-pay | Admitting: Internal Medicine

## 2021-10-23 MED ORDER — PANTOPRAZOLE SODIUM 40 MG PO TBEC
DELAYED_RELEASE_TABLET | Freq: Every day | ORAL | 0 refills | Status: DC
  Filled 2021-10-23: qty 90, 90d supply, fill #0

## 2021-10-23 MED ORDER — ATORVASTATIN CALCIUM 40 MG PO TABS
ORAL_TABLET | Freq: Every day | ORAL | 0 refills | Status: DC
  Filled 2021-10-23: qty 90, 90d supply, fill #0

## 2021-10-23 MED ORDER — TELMISARTAN 80 MG PO TABS
80.0000 mg | ORAL_TABLET | Freq: Every day | ORAL | 0 refills | Status: DC
  Filled 2021-10-23: qty 90, 90d supply, fill #0

## 2021-10-23 MED ORDER — FENOFIBRATE 160 MG PO TABS
ORAL_TABLET | Freq: Every day | ORAL | 0 refills | Status: DC
  Filled 2021-10-23: qty 90, 90d supply, fill #0

## 2021-11-07 ENCOUNTER — Other Ambulatory Visit: Payer: Self-pay

## 2021-12-18 ENCOUNTER — Other Ambulatory Visit: Payer: Self-pay

## 2021-12-18 MED ORDER — OZEMPIC (0.25 OR 0.5 MG/DOSE) 2 MG/3ML ~~LOC~~ SOPN
PEN_INJECTOR | SUBCUTANEOUS | 0 refills | Status: DC
  Filled 2021-12-18: qty 9, 84d supply, fill #0

## 2022-01-24 IMAGING — DX DG CHEST 1V PORT
1 series · 1 of 1 positions shown · non-contrast
Comparison: None.

CLINICAL DATA: Epigastric pain, nausea.

EXAM:
PORTABLE CHEST 1 VIEW

[chest ap]
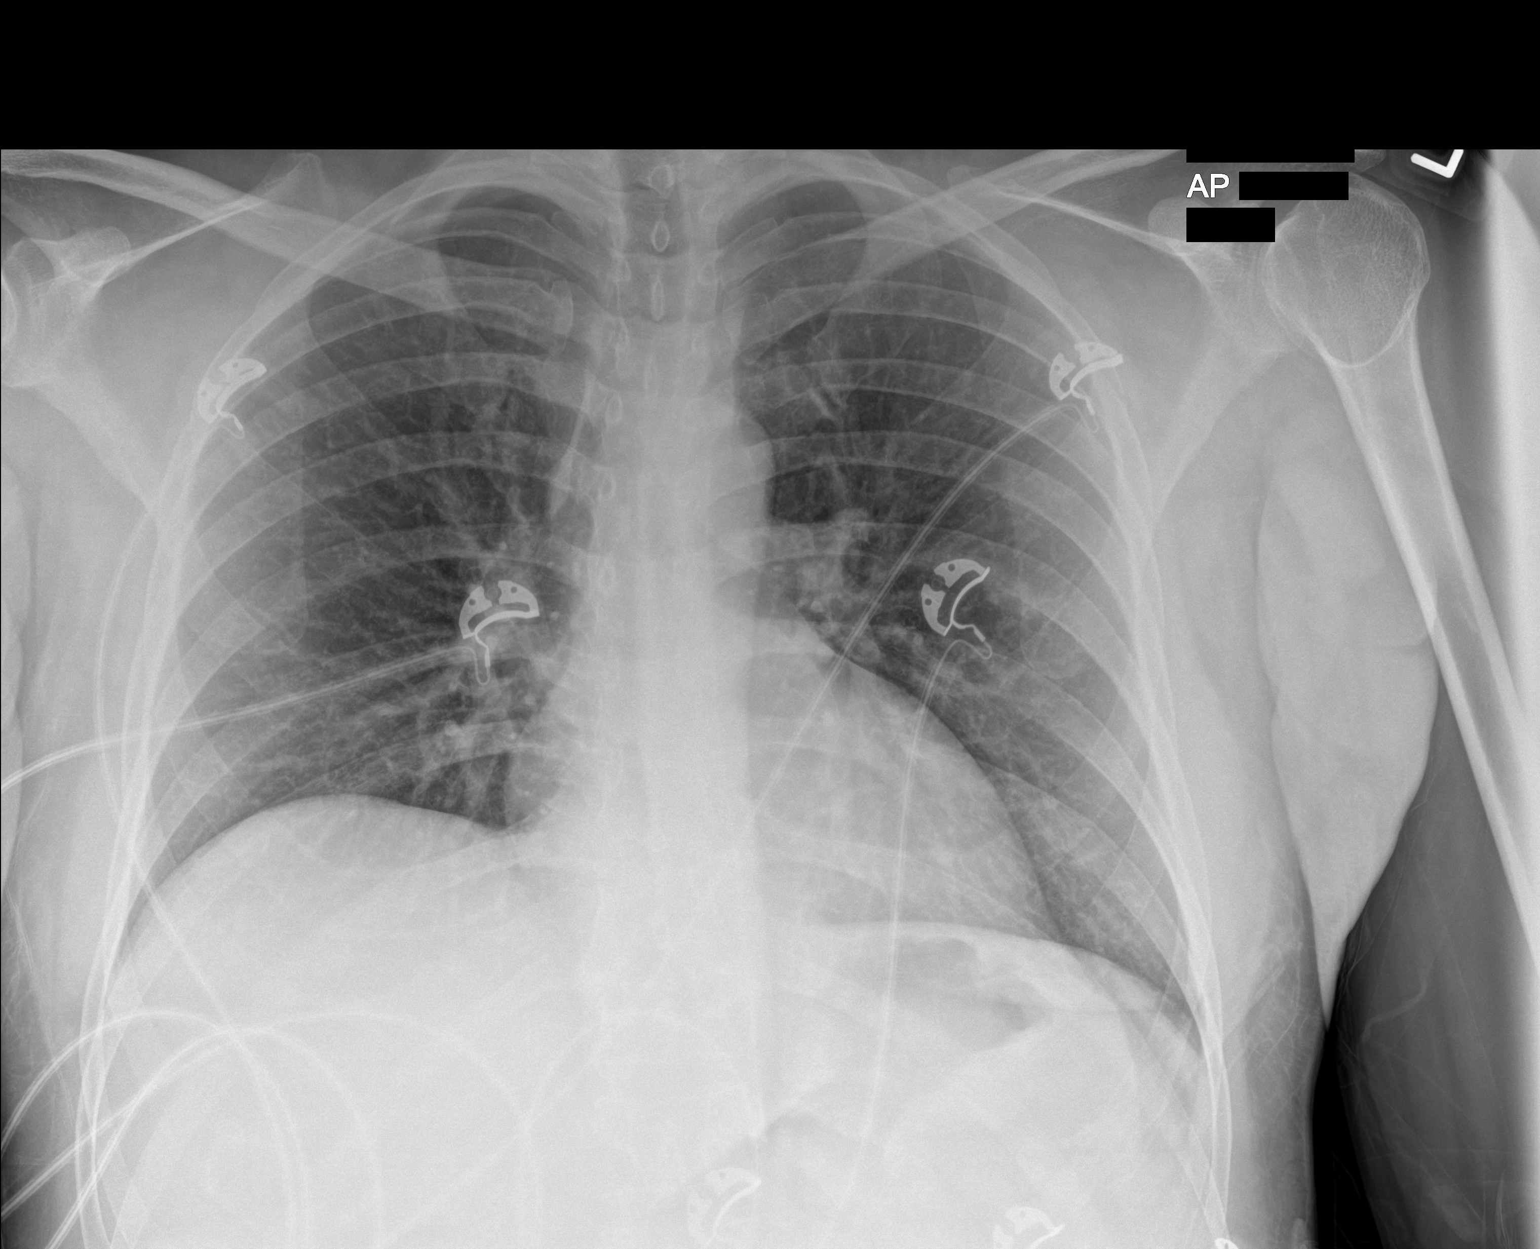

[1 of 1 positions shown; findings below may reference images not displayed]

FINDINGS: The heart size and mediastinal contours are within normal limits.
Both lungs are clear. The visualized skeletal structures are
unremarkable.
IMPRESSION: Normal study.

## 2022-02-17 ENCOUNTER — Other Ambulatory Visit: Payer: Self-pay | Admitting: Internal Medicine

## 2022-02-17 ENCOUNTER — Telehealth: Payer: Self-pay | Admitting: Internal Medicine

## 2022-02-17 ENCOUNTER — Other Ambulatory Visit: Payer: Self-pay

## 2022-02-17 DIAGNOSIS — E119 Type 2 diabetes mellitus without complications: Secondary | ICD-10-CM

## 2022-02-17 DIAGNOSIS — IMO0002 Reserved for concepts with insufficient information to code with codable children: Secondary | ICD-10-CM

## 2022-02-18 NOTE — Telephone Encounter (Signed)
LMTCB. Pt will need to schedule an appt. Has not been seen in a year

## 2022-02-18 NOTE — Telephone Encounter (Signed)
Pt returned call... Scheduled pt on 03/31/2022 at 2:00pm... Pt was wondering if there is any earlier appt... No avail appt until 03/31/2022.Randy Grant Pt requesting callback

## 2022-02-18 NOTE — Telephone Encounter (Signed)
LMTCB. Pt will need to schedule an appt. Has not been seen in a year 

## 2022-02-19 ENCOUNTER — Other Ambulatory Visit: Payer: Self-pay

## 2022-02-19 MED ORDER — UNIFINE PENTIPS 31G X 8 MM MISC
5 refills | Status: DC
  Filled 2022-02-19: qty 100, 30d supply, fill #0
  Filled 2022-06-02: qty 100, 30d supply, fill #1
  Filled 2022-08-25: qty 100, 30d supply, fill #2

## 2022-02-19 MED ORDER — FENOFIBRATE 160 MG PO TABS
ORAL_TABLET | Freq: Every day | ORAL | 0 refills | Status: DC
  Filled 2022-02-19: qty 90, 90d supply, fill #0

## 2022-02-19 MED ORDER — PANTOPRAZOLE SODIUM 40 MG PO TBEC
DELAYED_RELEASE_TABLET | Freq: Every day | ORAL | 0 refills | Status: DC
  Filled 2022-02-19: qty 90, 90d supply, fill #0

## 2022-02-19 MED ORDER — INSULIN LISPRO (1 UNIT DIAL) 100 UNIT/ML (KWIKPEN)
10.0000 [IU] | PEN_INJECTOR | Freq: Three times a day (TID) | SUBCUTANEOUS | 2 refills | Status: DC
  Filled 2022-02-19: qty 30, 90d supply, fill #0
  Filled 2022-06-02: qty 30, 90d supply, fill #1

## 2022-02-19 MED ORDER — ATORVASTATIN CALCIUM 40 MG PO TABS
ORAL_TABLET | Freq: Every day | ORAL | 0 refills | Status: DC
  Filled 2022-02-19: qty 90, 90d supply, fill #0

## 2022-02-19 MED ORDER — TELMISARTAN 80 MG PO TABS
80.0000 mg | ORAL_TABLET | Freq: Every day | ORAL | 0 refills | Status: DC
  Filled 2022-02-19: qty 90, 90d supply, fill #0

## 2022-02-19 MED FILL — Semaglutide Soln Pen-inj 0.25 or 0.5 MG/DOSE (2 MG/3ML): SUBCUTANEOUS | 28 days supply | Qty: 3 | Fill #0 | Status: AC

## 2022-02-19 NOTE — Telephone Encounter (Signed)
Pt called back and I relayed message to pt and he stated [facility]

## 2022-02-19 NOTE — Telephone Encounter (Signed)
I called patient to let him  know that was first available with out using blocked appointment spots. Will refill until he is seen.

## 2022-02-20 ENCOUNTER — Other Ambulatory Visit: Payer: Self-pay

## 2022-02-24 ENCOUNTER — Other Ambulatory Visit: Payer: Self-pay

## 2022-03-31 ENCOUNTER — Other Ambulatory Visit: Payer: Self-pay

## 2022-03-31 ENCOUNTER — Ambulatory Visit: Payer: 59 | Admitting: Internal Medicine

## 2022-03-31 ENCOUNTER — Encounter: Payer: Self-pay | Admitting: Internal Medicine

## 2022-03-31 VITALS — BP 148/78 | HR 82 | Temp 98.3°F | Ht 65.0 in | Wt 164.4 lb

## 2022-03-31 DIAGNOSIS — Z79899 Other long term (current) drug therapy: Secondary | ICD-10-CM | POA: Diagnosis not present

## 2022-03-31 DIAGNOSIS — Z1211 Encounter for screening for malignant neoplasm of colon: Secondary | ICD-10-CM | POA: Diagnosis not present

## 2022-03-31 DIAGNOSIS — Z91199 Patient's noncompliance with other medical treatment and regimen due to unspecified reason: Secondary | ICD-10-CM | POA: Diagnosis not present

## 2022-03-31 DIAGNOSIS — I10 Essential (primary) hypertension: Secondary | ICD-10-CM | POA: Diagnosis not present

## 2022-03-31 DIAGNOSIS — E1165 Type 2 diabetes mellitus with hyperglycemia: Secondary | ICD-10-CM

## 2022-03-31 DIAGNOSIS — Z125 Encounter for screening for malignant neoplasm of prostate: Secondary | ICD-10-CM | POA: Diagnosis not present

## 2022-03-31 DIAGNOSIS — E782 Mixed hyperlipidemia: Secondary | ICD-10-CM | POA: Diagnosis not present

## 2022-03-31 LAB — POCT GLYCOSYLATED HEMOGLOBIN (HGB A1C): Hemoglobin A1C: 8 % — AB (ref 4.0–5.6)

## 2022-03-31 MED ORDER — SEMAGLUTIDE (1 MG/DOSE) 4 MG/3ML ~~LOC~~ SOPN
1.0000 mg | PEN_INJECTOR | SUBCUTANEOUS | 2 refills | Status: DC
  Filled 2022-03-31: qty 3, 28d supply, fill #0
  Filled 2022-04-28: qty 3, 28d supply, fill #1
  Filled 2022-06-02: qty 3, 28d supply, fill #2

## 2022-03-31 MED ORDER — FREESTYLE LIBRE 2 SENSOR MISC
11 refills | Status: DC
  Filled 2022-03-31: qty 2, 28d supply, fill #0
  Filled 2022-05-14: qty 2, 28d supply, fill #1

## 2022-03-31 MED ORDER — FREESTYLE LIBRE 2 READER DEVI
11 refills | Status: DC
  Filled 2022-03-31 – 2022-04-06 (×2): qty 1, 30d supply, fill #0

## 2022-03-31 MED ORDER — CARVEDILOL 3.125 MG PO TABS
ORAL_TABLET | Freq: Two times a day (BID) | ORAL | 1 refills | Status: DC
  Filled 2022-03-31: qty 180, 90d supply, fill #0
  Filled 2022-07-24: qty 180, 90d supply, fill #1

## 2022-03-31 MED ORDER — EZETIMIBE 10 MG PO TABS
ORAL_TABLET | Freq: Every day | ORAL | 3 refills | Status: DC
  Filled 2022-03-31: qty 90, 90d supply, fill #0
  Filled 2022-07-24: qty 90, 90d supply, fill #1
  Filled 2022-11-23: qty 90, 90d supply, fill #2

## 2022-03-31 NOTE — Assessment & Plan Note (Signed)
Loss of control due to being lost to  Follow up.  Advised to increase Ozempic dose to 1.0 mg weekly, continue current doses of Novolog and tresiba given lack of available data except for today's a1c

## 2022-03-31 NOTE — Progress Notes (Addendum)
Subjective:  Patient ID: Randy Grant, male    DOB: Oct 02, 1965  Age: 56 y.o. MRN: [id number]  CC: The primary encounter diagnosis was Essential hypertension. Diagnoses of Uncontrolled type 2 diabetes mellitus with hyperglycemia ([place]), Mixed hyperlipidemia, Colon cancer screening, Long-term use of high-risk medication, Prostate cancer screening, and Noncompliance with diabetes treatment were also pertinent to this visit.   HPI Randy Grant presents for follow up on uncontrolled type 2 DM  last seen ONE YEAR AGO.  Chief Complaint  Patient presents with   Follow-up    Follow up on diabetes   1) Type  2 DM:  he  feels generally well,  But is not  exercising regularly or trying to lose weight. Checking  blood sugars less than once daily at variable times, usually only if he feels she may be having a hypoglycemic event. . .  Denies any recent hypoglyemic events.  Taking   medications as directed. But not taking metformin or carvedilol per refill history at pharmacy.   His ability to follow a  carbohydrate modified diet is limited by cultural dietary concerns.   Denies numbness, burning and tingling of extremities. Appetite is good.  Gained weight. His LAST EYE EXAM [place] [facility] JAN 2021 .  Had an appt Sep 25 2021 (per [facility])  but "NO SHOWED" .  Patient states that he overslept  and when he tried to reschedule  he was told it would be 6 months so he did not reschedule.  He is having trouble seeing distances at night , worse if bright lights are involved.  Foot exam is normal today   Not taking metformin due to diarrhea . Taking 45 units tresiba,  humaolgi 10 units tid and ozempic 0.5 mg weekly       Outpatient Medications Prior to Visit  Medication Sig Dispense Refill   ACCU-CHEK GUIDE test strip USE AS INSTRUCTED 100 strip 5   aspirin 81 MG tablet Take 81 mg by mouth daily.     atorvastatin (LIPITOR) 40 MG tablet TAKE 1 TABLET BY MOUTH DAILY. 90 tablet 0   B-D ULTRAFINE III  SHORT PEN 31G X 8 MM MISC TEST BLOOD SUGARS 2 TIMES DAILY 100 each 6   BAYER MICROLET LANCETS lancets USE AS DIRECTED 100 each 5   fenofibrate 160 MG tablet TAKE 1 TABLET BY MOUTH AT BEDTIME. 90 tablet 0   glucose blood (ONE TOUCH ULTRA TEST) test strip Use as instructed three times daily to check blood sugars 100 each 12   insulin glargine (SEMGLEE) 100 UNIT/ML Solostar Pen Inject 45 Units into the skin daily. 42 mL 3   insulin lispro (HUMALOG KWIKPEN) 100 UNIT/ML KwikPen Inject 10 Units into the skin 3 (three) times daily. 30 mL 2   Insulin Pen Needle (UNIFINE PENTIPS) 31G X 8 MM MISC test blood sugars 2 times a day 100 each 5   Multiple Vitamin (MULTIVITAMIN) tablet Take 1 tablet by mouth daily.     pantoprazole (PROTONIX) 40 MG tablet TAKE 1 TABLET BY MOUTH DAILY 90 tablet 0   telmisartan (MICARDIS) 80 MG tablet Take 1 tablet (80 mg total) by mouth daily. 90 tablet 0   carvedilol (COREG) 3.125 MG tablet TAKE 1 TABLET BY MOUTH 2 TIMES DAILY WITH A MEAL. 60 tablet 1   ezetimibe (ZETIA) 10 MG tablet TAKE 1 TABLET BY MOUTH DAILY. 90 tablet 1   metFORMIN (GLUCOPHAGE-XR) 500 MG 24 hr tablet TAKE 2 TABLETS BY MOUTH 2 TIMES DAILY.  360 tablet 1   Semaglutide,0.25 or 0.5MG/DOS, (OZEMPIC, 0.25 OR 0.5 MG/DOSE,) 2 MG/3ML SOPN Inject 0.5 mg into the skin once a week 3 mL 0   No facility-administered medications prior to visit.    Review of Systems;  Patient denies headache, fevers, malaise, unintentional weight loss, skin rash, eye pain, sinus congestion and sinus pain, sore throat, dysphagia,  hemoptysis , cough, dyspnea, wheezing, chest pain, palpitations, orthopnea, edema, abdominal pain, nausea, melena, diarrhea, constipation, flank pain, dysuria, hematuria, urinary  Frequency, nocturia, numbness, tingling, seizures,  Focal weakness, Loss of consciousness,  Tremor, insomnia, depression, anxiety, and suicidal ideation.      Objective:  BP (!) 148/78 (BP Location: Left Arm, Patient Position:  Sitting, Cuff Size: Normal)   Grant 82   Temp 98.3 F (36.8 C) (Oral)   Ht _0  (1.651 m)   Wt 164 lb 6.4 oz (74.6 kg)   SpO2 98%   BMI 27.36 kg/m   BP Readings from Last 3 Encounters:  03/31/22 (!) 148/78  03/10/21 (!) 150/86  01/13/21 112/80    Wt Readings from Last 3 Encounters:  03/31/22 164 lb 6.4 oz (74.6 kg)  03/10/21 159 lb (72.1 kg)  01/13/21 155 lb 6.4 oz (70.5 kg)    General appearance: alert, cooperative and appears stated age Ears: normal TM's and external ear canals both ears Throat: lips, mucosa, and tongue normal; teeth and gums normal Neck: no adenopathy, no carotid bruit, supple, symmetrical, trachea midline and thyroid not enlarged, symmetric, no tenderness/mass/nodules Back: symmetric, no curvature. ROM normal. No CVA tenderness. Lungs: clear to auscultation bilaterally Heart: regular rate and rhythm, S1, S2 normal, no murmur, click, rub or gallop Abdomen: soft, non-tender; bowel sounds normal; no masses,  no organomegaly Pulses: 2+ and symmetric Skin: Skin color, texture, turgor normal. No rashes or lesions Lymph nodes: Cervical, supraclavicular, and axillary nodes normal.  Lab Results  Component Value Date   HGBA1C 8.0 (A) 03/31/2022   HGBA1C 7.8 (H) 01/27/2021   HGBA1C 6.8 (H) 09/09/2020    Lab Results  Component Value Date   CREATININE 0.87 01/27/2021   CREATININE 0.83 09/09/2020   CREATININE 0.74 03/21/2020    Lab Results  Component Value Date   WBC 5.1 03/23/2020   HGB 13.4 03/23/2020   HCT 41.2 03/23/2020   PLT 316 03/23/2020   GLUCOSE 85 01/27/2021   CHOL 148 01/27/2021   TRIG 120.0 01/27/2021   HDL 42.80 01/27/2021   LDLDIRECT 111.0 09/16/2020   LDLCALC 82 01/27/2021   ALT 17 01/27/2021   AST 14 01/27/2021   NA 140 01/27/2021   K 4.4 01/27/2021   CL 104 01/27/2021   CREATININE 0.87 01/27/2021   BUN 11 01/27/2021   CO2 27 01/27/2021   TSH 1.16 03/29/2017   PSA 0.55 01/29/2020   INR 1.0 03/21/2020   HGBA1C 8.0 (A)  03/31/2022   MICROALBUR 2.0 (H) 09/09/2020    NM Myocar Multi W/Spect W/Wall Motion / EF  Result Date: 03/22/2020 Pharmacological myocardial perfusion imaging study with no significant  ischemia Normal wall motion, EF estimated at 65% No EKG changes concerning for ischemia at peak stress or in recovery. Low risk scan Signed, Esmond Plants, MD, Ph.D [facility]   ECHOCARDIOGRAM COMPLETE  Result Date: 03/22/2020    ECHOCARDIOGRAM REPORT   Patient Name:   YOUSEF HUGE Date of Exam: 03/22/2020 Medical Rec #:  [id number]          Height:       66.0 in  Accession #:    [id number]         Weight:       158.1 lb Date of Birth:  1966-05-25           BSA:          1.810 m Patient Age:    6 years           BP:           117/78 mmHg Patient Gender: M                  HR:           58 bpm. Exam Location:  [facility] Procedure: 2D Echo, Color Doppler and Cardiac Doppler Indications:     R07.9 Chest Pain  History:         Patient has no prior history of Echocardiogram examinations.                  Risk Factors:Hypertension, Diabetes and Dyslipidemia.  Sonographer:     Charmayne Sheer RDCS (AE) Referring Phys:  [employer] Diagnosing Phys: Ida Rogue MD  Sonographer Comments: Suboptimal subcostal window. IMPRESSIONS  1. Left ventricular ejection fraction, by estimation, is 60 to 65%. The left ventricle has normal function. The left ventricle has no regional wall motion abnormalities. Left ventricular diastolic parameters are consistent with Grade I diastolic dysfunction (impaired relaxation).  2. Right ventricular systolic function is normal. The right ventricular size is normal. Tricuspid regurgitation signal is inadequate for assessing PA pressure. FINDINGS  Left Ventricle: Left ventricular ejection fraction, by estimation, is 60 to 65%. The left ventricle has normal function. The left ventricle has no regional wall motion abnormalities. The left ventricular internal cavity size was normal in size. There is  no  left ventricular hypertrophy. Left ventricular diastolic parameters are consistent with Grade I diastolic dysfunction (impaired relaxation). Right Ventricle: The right ventricular size is normal. No increase in right ventricular wall thickness. Right ventricular systolic function is normal. Tricuspid regurgitation signal is inadequate for assessing PA pressure. Left Atrium: Left atrial size was normal in size. Right Atrium: Right atrial size was normal in size. Pericardium: There is no evidence of pericardial effusion. Mitral Valve: The mitral valve is normal in structure. Normal mobility of the mitral valve leaflets. Mild mitral valve regurgitation. No evidence of mitral valve stenosis. MV peak gradient, 3.4 mmHg. The mean mitral valve gradient is 1.0 mmHg. Tricuspid Valve: The tricuspid valve is normal in structure. Tricuspid valve regurgitation is not demonstrated. No evidence of tricuspid stenosis. Aortic Valve: The aortic valve is normal in structure. Aortic valve regurgitation is not visualized. No aortic stenosis is present. Aortic valve mean gradient measures 3.0 mmHg. Aortic valve peak gradient measures 5.6 mmHg. Aortic valve area, by VTI measures 2.94 cm. Pulmonic Valve: The pulmonic valve was normal in structure. Pulmonic valve regurgitation is not visualized. No evidence of pulmonic stenosis. Aorta: The aortic root is normal in size and structure. Venous: The inferior vena cava is normal in size with greater than 50% respiratory variability, suggesting right atrial pressure of 3 mmHg. IAS/Shunts: No atrial level shunt detected by color flow Doppler.  LEFT VENTRICLE PLAX 2D LVIDd:         4.18 cm  Diastology LVIDs:         2.43 cm  LV e' lateral:   11.20 cm/s LV PW:         1.09 cm  LV E/e' lateral: 8.0 LV IVS:  0.75 cm  LV e' medial:    7.94 cm/s LVOT diam:     2.00 cm  LV E/e' medial:  11.3 LV SV:         63 LV SV Index:   35 LVOT Area:     3.14 cm  RIGHT VENTRICLE RV Basal diam:  2.38 cm LEFT  ATRIUM             Index       RIGHT ATRIUM           Index LA diam:        3.90 cm 2.16 cm/m  RA Area:     11.40 cm LA Vol (A2C):   38.7 ml 21.39 ml/m RA Volume:   22.70 ml  12.54 ml/m LA Vol (A4C):   18.3 ml 10.11 ml/m LA Biplane Vol: 28.9 ml 15.97 ml/m  AORTIC VALVE                   PULMONIC VALVE AV Area (Vmax):    2.57 cm    PV Vmax:       1.13 m/s AV Area (Vmean):   2.79 cm    PV Vmean:      73.800 cm/s AV Area (VTI):     2.94 cm    PV VTI:        0.215 m AV Vmax:           118.00 cm/s PV Peak grad:  5.1 mmHg AV Vmean:          73.100 cm/s PV Mean grad:  2.0 mmHg AV VTI:            0.214 m AV Peak Grad:      5.6 mmHg AV Mean Grad:      3.0 mmHg LVOT Vmax:         96.60 cm/s LVOT Vmean:        65.000 cm/s LVOT VTI:          0.200 m LVOT/AV VTI ratio: 0.93  AORTA Ao Root diam: 2.70 cm MITRAL VALVE MV Area (PHT): 4.04 cm    SHUNTS MV Peak grad:  3.4 mmHg    Systemic VTI:  0.20 m MV Mean grad:  1.0 mmHg    Systemic Diam: 2.00 cm MV Vmax:       0.92 m/s MV Vmean:      50.9 cm/s MV Decel Time: 188 msec MV E velocity: 90.00 cm/s MV A velocity: 82.70 cm/s MV E/A ratio:  1.09 Ida Rogue MD Electronically signed by Ida Rogue MD Signature Date/Time: 03/22/2020/1:29:51 PM    Final     Assessment & Plan:   Problem List Items Addressed This Visit     Essential hypertension - Primary    Elevated due to noncompliance with regimen.  Resume carvedilol       Relevant Medications   carvedilol (COREG) 3.125 MG tablet   ezetimibe (ZETIA) 10 MG tablet   Other Relevant Orders   Comp Met (CMET)   Urine Microalbumin w/creat. ratio   Mixed hyperlipidemia   Relevant Medications   carvedilol (COREG) 3.125 MG tablet   ezetimibe (ZETIA) 10 MG tablet   Other Relevant Orders   Lipid Profile   Direct LDL   Noncompliance with diabetes treatment    Secondary to family stressors and increased work load      Prostate cancer screening   Relevant Orders   PSA   Uncontrolled type 2 diabetes mellitus  with  hyperglycemia ([place])    Loss of control due to being lost to  Follow up.  Advised to increase Ozempic dose to 1.0 mg weekly, continue current doses of Novolog and tresiba given lack of available data except for today's a1c       Relevant Medications   Semaglutide, 1 MG/DOSE, 4 MG/3ML SOPN   Continuous Blood Gluc Receiver (FREESTYLE LIBRE 2 READER) DEVI   Continuous Blood Gluc Sensor (FREESTYLE LIBRE 2 SENSOR) MISC   Other Relevant Orders   Comp Met (CMET)   Urine Microalbumin w/creat. ratio   POCT HgB A1C (Completed)   Other Visit Diagnoses     Colon cancer screening       Relevant Orders   Cologuard   Long-term use of high-risk medication       Relevant Orders   CBC with Differential/Platelet   TSH       I spent a total of 40 minutes with this patient in a face to face visit on the date of this encounter reviewing the last office visit with me ONE YEAR AGO, counselling patient on  his need to manage his diabetes more consistently , his  diet and eating habits, home sugar  readings ,  and post visit ordering of testing and therapeutics.    Follow-up: Return in about 4 weeks (around 04/28/2022) for follow up diabetes.   Crecencio Mc, MD

## 2022-03-31 NOTE — Assessment & Plan Note (Signed)
He is overdue for follow up with ophthalmology follow up.  Last visit 2021.

## 2022-03-31 NOTE — Assessment & Plan Note (Signed)
Secondary to family stressors and increased work load

## 2022-03-31 NOTE — Patient Instructions (Addendum)
Resume carvedilol  .  It treats hypertension and protects your heart  I have increased the Ozempic to 1.0 mg weekly with your next refill  CONTINUE FOR AT LEAST A MONTH,  NOTIFY ME IF YOU WANT TO INCREASE DOSE AT THAT POINT    PLEASE CHECK YOUR SUGAR AT LEAST 4 TIMES DAILY USING THE NEW SKIN MONITOR  THE SENSOR NEEDS TO BE REPLACED EVERY 2 WEEKS

## 2022-03-31 NOTE — Assessment & Plan Note (Signed)
Elevated due to noncompliance with regimen.  Resume carvedilol

## 2022-04-01 ENCOUNTER — Other Ambulatory Visit: Payer: Self-pay

## 2022-04-01 LAB — COMPREHENSIVE METABOLIC PANEL
ALT: 22 U/L (ref 0–53)
AST: 16 U/L (ref 0–37)
Albumin: 4.2 g/dL (ref 3.5–5.2)
Alkaline Phosphatase: 56 U/L (ref 39–117)
BUN: 11 mg/dL (ref 6–23)
CO2: 28 mEq/L (ref 19–32)
Calcium: 9.5 mg/dL (ref 8.4–10.5)
Chloride: 102 mEq/L (ref 96–112)
Creatinine, Ser: 0.73 mg/dL (ref 0.40–1.50)
GFR: 100.61 mL/min (ref >60.00)
Glucose, Bld: 105 mg/dL — ABNORMAL HIGH (ref 70–99)
Potassium: 4.5 mEq/L (ref 3.5–5.1)
Sodium: 138 mEq/L (ref 135–145)
Total Bilirubin: 0.4 mg/dL (ref 0.2–1.2)
Total Protein: 6.7 g/dL (ref 6.0–8.3)

## 2022-04-01 LAB — CBC WITH DIFFERENTIAL/PLATELET
Basophils Absolute: 0 10*3/uL (ref 0.0–0.1)
Basophils Relative: 1 % (ref 0.0–3.0)
Eosinophils Absolute: 0.1 10*3/uL (ref 0.0–0.7)
Eosinophils Relative: 3.5 % (ref 0.0–5.0)
HCT: 41.8 % (ref 39.0–52.0)
Hemoglobin: 13.9 g/dL (ref 13.0–17.0)
Lymphocytes Relative: 38.5 % (ref 12.0–46.0)
Lymphs Abs: 1.5 10*3/uL (ref 0.7–4.0)
MCHC: 33.2 g/dL (ref 30.0–36.0)
MCV: 84.2 fl (ref 78.0–100.0)
Monocytes Absolute: 0.3 10*3/uL (ref 0.1–1.0)
Monocytes Relative: 8.7 % (ref 3.0–12.0)
Neutro Abs: 1.9 10*3/uL (ref 1.4–7.7)
Neutrophils Relative %: 48.3 % (ref 43.0–77.0)
Platelets: 265 10*3/uL (ref 150.0–400.0)
RBC: 4.96 Mil/uL (ref 4.22–5.81)
RDW: 14.1 % (ref 11.5–15.5)
WBC: 3.9 10*3/uL — ABNORMAL LOW (ref 4.0–10.5)

## 2022-04-01 LAB — LIPID PANEL
Cholesterol: 190 mg/dL (ref 0–200)
HDL: 39.5 mg/dL (ref >39.00)
NonHDL: 150.41
Total CHOL/HDL Ratio: 5
Triglycerides: 248 mg/dL — ABNORMAL HIGH (ref 0.0–149.0)
VLDL: 49.6 mg/dL — ABNORMAL HIGH (ref 0.0–40.0)

## 2022-04-01 LAB — MICROALBUMIN / CREATININE URINE RATIO
Creatinine,U: 84.4 mg/dL
Microalb Creat Ratio: 0.8 mg/g (ref 0.0–30.0)
Microalb, Ur: <0.7 mg/dL (ref 0.0–1.9)

## 2022-04-01 LAB — LDL CHOLESTEROL, DIRECT: Direct LDL: 123 mg/dL

## 2022-04-01 LAB — PSA: PSA: 0.61 ng/mL (ref 0.10–4.00)

## 2022-04-01 LAB — TSH: TSH: 0.98 u[IU]/mL (ref 0.35–5.50)

## 2022-04-03 ENCOUNTER — Other Ambulatory Visit: Payer: Self-pay

## 2022-04-06 ENCOUNTER — Other Ambulatory Visit: Payer: Self-pay

## 2022-04-07 ENCOUNTER — Other Ambulatory Visit: Payer: Self-pay

## 2022-04-28 ENCOUNTER — Other Ambulatory Visit: Payer: Self-pay

## 2022-05-14 ENCOUNTER — Other Ambulatory Visit: Payer: Self-pay | Admitting: Internal Medicine

## 2022-05-14 ENCOUNTER — Other Ambulatory Visit: Payer: Self-pay

## 2022-05-14 MED FILL — Telmisartan Tab 80 MG: ORAL | 90 days supply | Qty: 90 | Fill #0 | Status: AC

## 2022-06-02 ENCOUNTER — Other Ambulatory Visit: Payer: Self-pay | Admitting: Internal Medicine

## 2022-06-02 ENCOUNTER — Other Ambulatory Visit: Payer: Self-pay

## 2022-06-03 ENCOUNTER — Other Ambulatory Visit: Payer: Self-pay

## 2022-06-03 ENCOUNTER — Other Ambulatory Visit: Payer: Self-pay | Admitting: Internal Medicine

## 2022-06-03 MED FILL — Pantoprazole Sodium EC Tab 40 MG (Base Equiv): ORAL | 90 days supply | Qty: 90 | Fill #0 | Status: AC

## 2022-06-03 MED FILL — Atorvastatin Calcium Tab 40 MG (Base Equivalent): ORAL | 90 days supply | Qty: 90 | Fill #0 | Status: AC

## 2022-06-03 MED FILL — Fenofibrate Tab 160 MG: ORAL | 90 days supply | Qty: 90 | Fill #0 | Status: AC

## 2022-06-04 ENCOUNTER — Other Ambulatory Visit: Payer: Self-pay

## 2022-06-04 MED ORDER — INSULIN GLARGINE-YFGN 100 UNIT/ML ~~LOC~~ SOPN
PEN_INJECTOR | SUBCUTANEOUS | 3 refills | Status: AC
  Filled 2022-06-04: qty 45, 90d supply, fill #0

## 2022-06-19 ENCOUNTER — Other Ambulatory Visit: Payer: Self-pay

## 2022-06-19 ENCOUNTER — Ambulatory Visit
Admission: EM | Admit: 2022-06-19 | Discharge: 2022-06-19 | Disposition: A | Discharge status: Home / Self Care | Payer: 59 | Attending: Emergency Medicine | Admitting: Emergency Medicine

## 2022-06-19 DIAGNOSIS — J069 Acute upper respiratory infection, unspecified: Secondary | ICD-10-CM | POA: Diagnosis not present

## 2022-06-19 DIAGNOSIS — Z20822 Contact with and (suspected) exposure to covid-19: Secondary | ICD-10-CM | POA: Diagnosis not present

## 2022-06-19 LAB — RESP PANEL BY RT-PCR (RSV, FLU A&B, COVID)  RVPGX2
Influenza A by PCR: NEGATIVE
Influenza B by PCR: NEGATIVE
Resp Syncytial Virus by PCR: POSITIVE — AB
SARS Coronavirus 2 by RT PCR: NEGATIVE

## 2022-06-19 MED ORDER — IPRATROPIUM BROMIDE 0.06 % NA SOLN
2.0000 | Freq: Four times a day (QID) | NASAL | 0 refills | Status: AC
  Filled 2022-06-19: qty 15, 19d supply, fill #0

## 2022-06-19 MED ORDER — IBUPROFEN 600 MG PO TABS
600.0000 mg | ORAL_TABLET | Freq: Four times a day (QID) | ORAL | 0 refills | Status: AC | PRN
  Filled 2022-06-19: qty 30, 8d supply, fill #0

## 2022-06-19 MED ORDER — PROMETHAZINE-DM 6.25-15 MG/5ML PO SYRP
5.0000 mL | ORAL_SOLUTION | Freq: Four times a day (QID) | ORAL | 0 refills | Status: DC | PRN
  Filled 2022-06-19: qty 118, 6d supply, fill #0

## 2022-06-19 NOTE — ED Triage Notes (Signed)
Pt. Presents to UC w/ c/o a persistent cough for the past two days.

## 2022-06-19 NOTE — Discharge Instructions (Signed)
We will contact you and prescribe the appropriate antiviral medication if your COVID or flu come back positive.  In the meantime, Atrovent nasal spray, Mucinex, saline nasal irrigation with a [facility] Med rinse and distilled water as often as you want to help prevent a bacterial sinus infection and to stop the postnasal drip.  Promethazine DM as needed for cough.  600 mg of ibuprofen combined with 1000 mg of Tylenol 3 times a day as needed for chest soreness.

## 2022-06-19 NOTE — ED Provider Notes (Signed)
HPI  SUBJECTIVE:  Randy Grant is a 56 y.o. male who presents with 2 days of a cough that is occasionally productive of a small amount of mucus.  It has not changed in color or amount.  He reports headaches, nasal congestion, rhinorrhea, sinus pressure and postnasal drip.  Reports chest soreness present with coughing only.  He is unable to sleep at night secondary to the cough.  No fevers, body aches, facial swelling, upper dental pain, wheezing, shortness of breath, nausea, vomiting, diarrhea, abdominal pain.  No known COVID or flu exposure.  He had 2 doses of the COVID-vaccine and this years flu vaccine.  No antibiotics in the past 3 months.  No antipyretic in the past 6 hours.  He has tried ibuprofen and over the counter cough medication.  The ibuprofen helps.  Symptoms worse with coughing.  No GERD or allergy symptoms.  He has a past medical history of GERD, hypercholesterolemia, diabetes, hypertension.  No history of pulmonary disease, smoking, chronic kidney disease.  PCP: [facility]   Past Medical History:  Diagnosis Date   Cataract 2017   left   Diabetes mellitus    GERD (gastroesophageal reflux disease)    Hyperlipidemia    Hypertension     History reviewed. No pertinent surgical history.  Family History  Problem Relation Age of Onset   Diabetes Mother    Hyperlipidemia Father    Diabetes Father    Diabetes Sister    Diabetes Sister     Social History   Tobacco Use   Smoking status: Former    Types: Cigarettes    Quit date: 06/25/2001    Years since quitting: 20.9   Smokeless tobacco: Never  Vaping Use   Vaping Use: Never used  Substance Use Topics   Alcohol use: No    Alcohol/week: 0.0 standard drinks of alcohol   Drug use: No    No current facility-administered medications for this encounter.  Current Outpatient Medications:    ibuprofen (ADVIL) 600 MG tablet, Take 1 tablet (600 mg total) by mouth every 6 (six) hours as needed., Disp: 30  tablet, Rfl: 0   ipratropium (ATROVENT) 0.06 % nasal spray, Place 2 sprays into both nostrils 4 (four) times daily., Disp: 15 mL, Rfl: 0   promethazine-dextromethorphan (PROMETHAZINE-DM) 6.25-15 MG/5ML syrup, Take 5 mLs by mouth 4 (four) times daily as needed for cough., Disp: 118 mL, Rfl: 0   ACCU-CHEK GUIDE test strip, USE AS INSTRUCTED, Disp: 100 strip, Rfl: 5   aspirin 81 MG tablet, Take 81 mg by mouth daily., Disp: , Rfl:    atorvastatin (LIPITOR) 40 MG tablet, TAKE 1 TABLET BY MOUTH DAILY., Disp: 90 tablet, Rfl: 1   B-D ULTRAFINE III SHORT PEN 31G X 8 MM MISC, TEST BLOOD SUGARS 2 TIMES DAILY, Disp: 100 each, Rfl: 6   BAYER MICROLET LANCETS lancets, USE AS DIRECTED, Disp: 100 each, Rfl: 5   carvedilol (COREG) 3.125 MG tablet, TAKE 1 TABLET BY MOUTH 2 TIMES DAILY WITH A MEAL., Disp: 180 tablet, Rfl: 1   Continuous Blood Gluc Receiver (FREESTYLE LIBRE 2 READER) DEVI, USE 4 TIMES DAILY TO CHECK BLOOD SUGARS  REASON :  UNCONTROLLED TYPE 2 DIABETES ,  INSULIN DEPENDENT  WITH RETINOPATHY, Disp: 1 each, Rfl: 11   Continuous Blood Gluc Sensor (FREESTYLE LIBRE 2 SENSOR) MISC, Use with reader to check BS 4 times daily, Disp: 1 each, Rfl: 11   ezetimibe (ZETIA) 10 MG tablet, TAKE 1 TABLET BY MOUTH  DAILY., Disp: 90 tablet, Rfl: 3   fenofibrate 160 MG tablet, TAKE 1 TABLET BY MOUTH AT BEDTIME., Disp: 90 tablet, Rfl: 1   glucose blood (ONE TOUCH ULTRA TEST) test strip, Use as instructed three times daily to check blood sugars, Disp: 100 each, Rfl: 12   insulin glargine (SEMGLEE) 100 UNIT/ML Solostar Pen, Inject 45 Units into the skin daily., Disp: 42 mL, Rfl: 3   insulin glargine-yfgn (SEMGLEE, YFGN,) 100 UNIT/ML Pen, INJECT 45 UNITS INTO THE SKIN DAILY., Disp: 45 mL, Rfl: 3   insulin lispro (HUMALOG KWIKPEN) 100 UNIT/ML KwikPen, Inject 10 Units into the skin 3 (three) times daily., Disp: 30 mL, Rfl: 2   Insulin Pen Needle (UNIFINE PENTIPS) 31G X 8 MM MISC, test blood sugars 2 times a day, Disp: 100 each,  Rfl: 5   Multiple Vitamin (MULTIVITAMIN) tablet, Take 1 tablet by mouth daily., Disp: , Rfl:    pantoprazole (PROTONIX) 40 MG tablet, TAKE 1 TABLET BY MOUTH DAILY, Disp: 90 tablet, Rfl: 1   Semaglutide, 1 MG/DOSE, 4 MG/3ML SOPN, Inject 1 mg as directed once a week., Disp: 3 mL, Rfl: 2   telmisartan (MICARDIS) 80 MG tablet, Take 1 tablet (80 mg total) by mouth daily., Disp: 90 tablet, Rfl: 0  No Known Allergies   ROS  As noted in HPI.   Physical Exam  BP (!) 145/81   Pulse 70   Temp 97.9 F (36.6 C)   Resp 18   SpO2 98%   Constitutional: Well developed, well nourished, no acute distress Eyes:  EOMI, conjunctiva normal bilaterally HENT: Normocephalic, atraumatic,mucus membranes moist.  Clear nasal congestion.  Erythematous, swollen turbinates.  No maxillary, frontal sinus tenderness.  No postnasal drip. Neck: No cervical lymphadenopathy Respiratory: Normal inspiratory effort, lungs clear bilaterally, good air movement.  Positive anterior, lateral chest wall tenderness Cardiovascular: Normal rate, regular rhythm, no murmurs, rubs, gallops GI: nondistended skin: No rash, skin intact Musculoskeletal: no deformities Neurologic: Alert & oriented x 3, no focal neuro deficits Psychiatric: Speech and behavior appropriate   ED Course   Medications - No data to display  Orders Placed This Encounter  Procedures   Resp panel by RT-PCR (RSV, Flu A&B, Covid) Anterior Nasal Swab    Standing Status:   Standing    Number of Occurrences:   1    No results found for this or any previous visit (from the past 24 hour(s)). No results found.  ED Clinical Impression  1. Viral upper respiratory tract infection with cough   2. Encounter for laboratory testing for COVID-19 virus      ED Assessment/Plan     Presentation consistent with a URI.  I suspect postnasal drip is causing his cough.  Checking COVID, flu, RSV.  Will prescribe Paxlovid on molnupiravir if COVID is positive, or  Tamiflu if influenza is positive due to comorbidities.  In the meantime, Atrovent nasal spray, Mucinex, saline nasal irrigation, Promethazine DM ibuprofen/Tylenol as needed for chest soreness.  Return here or follow-up with PCP in a week if not better, or for double sickening, we can consider imaging and antibiotics at that time.  Discussed labs, MDM, treatment plan, and plan for follow-up with patient. patient agrees with plan.   Meds ordered this encounter  Medications   ipratropium (ATROVENT) 0.06 % nasal spray    Sig: Place 2 sprays into both nostrils 4 (four) times daily.    Dispense:  15 mL    Refill:  0   promethazine-dextromethorphan (PROMETHAZINE-DM) 6.25-15  MG/5ML syrup    Sig: Take 5 mLs by mouth 4 (four) times daily as needed for cough.    Dispense:  118 mL    Refill:  0   ibuprofen (ADVIL) 600 MG tablet    Sig: Take 1 tablet (600 mg total) by mouth every 6 (six) hours as needed.    Dispense:  30 tablet    Refill:  0      *This clinic note was created using Scientist, clinical (histocompatibility and immunogenetics). Therefore, there may be occasional mistakes despite careful proofreading.  ?    Domenick Gong, MD 06/19/22 1155

## 2022-07-01 ENCOUNTER — Other Ambulatory Visit: Payer: Self-pay

## 2022-07-01 ENCOUNTER — Other Ambulatory Visit: Payer: Self-pay | Admitting: Internal Medicine

## 2022-07-01 MED ORDER — OZEMPIC (1 MG/DOSE) 4 MG/3ML ~~LOC~~ SOPN
1.0000 mg | PEN_INJECTOR | SUBCUTANEOUS | 2 refills | Status: DC
  Filled 2022-07-01 – 2022-07-24 (×2): qty 3, 28d supply, fill #0
  Filled 2022-08-25: qty 3, 28d supply, fill #1
  Filled 2022-10-20: qty 3, 28d supply, fill #2

## 2022-07-13 ENCOUNTER — Other Ambulatory Visit: Payer: Self-pay

## 2022-07-24 ENCOUNTER — Other Ambulatory Visit: Payer: Self-pay

## 2022-08-25 ENCOUNTER — Other Ambulatory Visit: Payer: Self-pay | Admitting: Internal Medicine

## 2022-08-25 ENCOUNTER — Other Ambulatory Visit: Payer: Self-pay

## 2022-08-26 ENCOUNTER — Other Ambulatory Visit: Payer: Self-pay

## 2022-08-26 MED ORDER — TELMISARTAN 80 MG PO TABS
80.0000 mg | ORAL_TABLET | Freq: Every day | ORAL | 1 refills | Status: DC
  Filled 2022-08-26: qty 90, 90d supply, fill #0
  Filled 2022-11-23: qty 90, 90d supply, fill #1

## 2022-09-04 ENCOUNTER — Other Ambulatory Visit: Payer: Self-pay

## 2022-09-04 ENCOUNTER — Encounter: Payer: Self-pay | Admitting: Internal Medicine

## 2022-09-04 ENCOUNTER — Ambulatory Visit: Payer: Commercial Managed Care - PPO | Admitting: Internal Medicine

## 2022-09-04 VITALS — BP 140/78 | HR 75 | Temp 98.0°F | Resp 16 | Ht 65.0 in | Wt 162.5 lb

## 2022-09-04 DIAGNOSIS — E119 Type 2 diabetes mellitus without complications: Secondary | ICD-10-CM

## 2022-09-04 DIAGNOSIS — M545 Low back pain, unspecified: Secondary | ICD-10-CM | POA: Diagnosis not present

## 2022-09-04 DIAGNOSIS — E1165 Type 2 diabetes mellitus with hyperglycemia: Secondary | ICD-10-CM | POA: Diagnosis not present

## 2022-09-04 DIAGNOSIS — E782 Mixed hyperlipidemia: Secondary | ICD-10-CM | POA: Diagnosis not present

## 2022-09-04 DIAGNOSIS — Z1211 Encounter for screening for malignant neoplasm of colon: Secondary | ICD-10-CM | POA: Diagnosis not present

## 2022-09-04 DIAGNOSIS — I10 Essential (primary) hypertension: Secondary | ICD-10-CM | POA: Diagnosis not present

## 2022-09-04 DIAGNOSIS — G8929 Other chronic pain: Secondary | ICD-10-CM

## 2022-09-04 MED ORDER — CARVEDILOL 3.125 MG PO TABS
ORAL_TABLET | Freq: Two times a day (BID) | ORAL | 1 refills | Status: DC
  Filled 2022-09-04: qty 180, fill #0

## 2022-09-04 MED ORDER — CARVEDILOL 6.25 MG PO TABS
6.2500 mg | ORAL_TABLET | Freq: Two times a day (BID) | ORAL | 1 refills | Status: DC
  Filled 2022-09-04: qty 180, 90d supply, fill #0
  Filled 2022-11-23: qty 180, 90d supply, fill #1

## 2022-09-04 MED ORDER — FREESTYLE LIBRE 2 SENSOR MISC
3 refills | Status: DC
  Filled 2022-09-04: qty 2, 28d supply, fill #0

## 2022-09-04 MED ORDER — FREESTYLE LIBRE 2 READER DEVI
3 refills | Status: DC
  Filled 2022-09-04: qty 1, 30d supply, fill #0

## 2022-09-04 MED ORDER — INSULIN LISPRO (1 UNIT DIAL) 100 UNIT/ML (KWIKPEN)
PEN_INJECTOR | SUBCUTANEOUS | 2 refills | Status: DC
  Filled 2022-09-04: qty 15, 30d supply, fill #0
  Filled 2023-03-02: qty 15, 30d supply, fill #1
  Filled 2023-05-17: qty 15, 30d supply, fill #2
  Filled 2023-05-17: qty 30, 67d supply, fill #2
  Filled 2023-09-03: qty 30, 67d supply, fill #3

## 2022-09-04 NOTE — Patient Instructions (Addendum)
WE HAVE INCREASED YOUR DOSE OF  CARVEDILOL TO 6.25 MG TWICE DAILY FOR YOUR BLOOD PRESSURE   CONTINUE  80 MG TELMISARTAN  AS WELL  GET YOUR BLOOD PRESSURE CHECKED IN ONE WEEK   FOR YOUR DIABETES:  RETURN ON MONDAY FOR LABS   Increase the meal time insulin at your biggest meal from 10 units to 15 units

## 2022-09-04 NOTE — Progress Notes (Unsigned)
Subjective:  Patient ID: Randy Grant, male    DOB: 03/24/1966  Age: 57 y.o. MRN: [id number]  CC: The primary encounter diagnosis was Colon cancer screening. Diagnoses of Essential hypertension, Uncontrolled type 2 diabetes mellitus with hyperglycemia ([place]), and Mixed hyperlipidemia were also pertinent to this visit.   HPI Randy Grant presents for  Chief Complaint  Patient presents with   Hypertension   1) not checking BP at home.  Taking carvedilol and telmisartan  2) Type 2 DM : used the CBG monitor libre 3 but not working with phone . Getting lots of beeps   works 3 pm to midnight. Lows to 55 when working . But also noting poptrandials of 250   3) waking up with back pain that radiates to both thighs.  Aggravated by heavy lifting , sometimes not provoked by activity   Outpatient Medications Prior to Visit  Medication Sig Dispense Refill   ACCU-CHEK GUIDE test strip USE AS INSTRUCTED 100 strip 5   aspirin 81 MG tablet Take 81 mg by mouth daily.     atorvastatin (LIPITOR) 40 MG tablet TAKE 1 TABLET BY MOUTH DAILY. 90 tablet 1   B-D ULTRAFINE III SHORT PEN 31G X 8 MM MISC TEST BLOOD SUGARS 2 TIMES DAILY 100 each 6   BAYER MICROLET LANCETS lancets USE AS DIRECTED 100 each 5   carvedilol (COREG) 3.125 MG tablet TAKE 1 TABLET BY MOUTH 2 TIMES DAILY WITH A MEAL. 180 tablet 1   Continuous Blood Gluc Receiver (FREESTYLE LIBRE 2 READER) DEVI USE 4 TIMES DAILY TO CHECK BLOOD SUGARS  REASON :  UNCONTROLLED TYPE 2 DIABETES ,  INSULIN DEPENDENT  WITH RETINOPATHY 1 each 11   Continuous Blood Gluc Sensor (FREESTYLE LIBRE 2 SENSOR) MISC Use with reader to check BS 4 times daily 1 each 11   ezetimibe (ZETIA) 10 MG tablet TAKE 1 TABLET BY MOUTH DAILY. 90 tablet 3   fenofibrate 160 MG tablet TAKE 1 TABLET BY MOUTH AT BEDTIME. 90 tablet 1   glucose blood (ONE TOUCH ULTRA TEST) test strip Use as instructed three times daily to check blood sugars 100 each 12   ibuprofen (ADVIL) 600 MG  tablet Take 1 tablet (600 mg total) by mouth every 6 (six) hours as needed. 30 tablet 0   insulin glargine (SEMGLEE) 100 UNIT/ML Solostar Pen Inject 45 Units into the skin daily. 42 mL 3   insulin glargine-yfgn (SEMGLEE, YFGN,) 100 UNIT/ML Pen INJECT 45 UNITS INTO THE SKIN DAILY. 45 mL 3   insulin lispro (HUMALOG KWIKPEN) 100 UNIT/ML KwikPen Inject 10 Units into the skin 3 (three) times daily. 30 mL 2   Insulin Pen Needle (UNIFINE PENTIPS) 31G X 8 MM MISC test blood sugars 2 times a day 100 each 5   ipratropium (ATROVENT) 0.06 % nasal spray Place 2 sprays into both nostrils 4 (four) times daily. 15 mL 0   Multiple Vitamin (MULTIVITAMIN) tablet Take 1 tablet by mouth daily.     pantoprazole (PROTONIX) 40 MG tablet TAKE 1 TABLET BY MOUTH DAILY 90 tablet 1   promethazine-dextromethorphan (PROMETHAZINE-DM) 6.25-15 MG/5ML syrup Take 5 mLs by mouth 4 (four) times daily as needed for cough. 118 mL 0   Semaglutide, 1 MG/DOSE, (OZEMPIC, 1 MG/DOSE,) 4 MG/3ML SOPN Inject 1 mg as directed once a week. 3 mL 2   telmisartan (MICARDIS) 80 MG tablet Take 1 tablet (80 mg total) by mouth daily. 90 tablet 1   No facility-administered medications prior to visit.  Review of Systems;  Patient denies headache, fevers, malaise, unintentional weight loss, skin rash, eye pain, sinus congestion and sinus pain, sore throat, dysphagia,  hemoptysis , cough, dyspnea, wheezing, chest pain, palpitations, orthopnea, edema, abdominal pain, nausea, melena, diarrhea, constipation, flank pain, dysuria, hematuria, urinary  Frequency, nocturia, numbness, tingling, seizures,  Focal weakness, Loss of consciousness,  Tremor, insomnia, depression, anxiety, and suicidal ideation.      Objective:  BP (!) 146/84   Grant 75   Temp 98 F (36.7 C)   Resp 16   Ht 5\' 5"  (1.651 m)   Wt 162 lb 8 oz (73.7 kg)   SpO2 98%   BMI 27.04 kg/m   BP Readings from Last 3 Encounters:  09/04/22 (!) 146/84  06/19/22 (!) 145/81  03/31/22 (!)  148/78    Wt Readings from Last 3 Encounters:  09/04/22 162 lb 8 oz (73.7 kg)  03/31/22 164 lb 6.4 oz (74.6 kg)  03/10/21 159 lb (72.1 kg)    Physical Exam Vitals reviewed.  Constitutional:      General: He is not in acute distress.    Appearance: Normal appearance. He is normal weight. He is not ill-appearing, toxic-appearing or diaphoretic.  HENT:     Head: Normocephalic.  Eyes:     General: No scleral icterus.       Right eye: No discharge.        Left eye: No discharge.     Conjunctiva/sclera: Conjunctivae normal.  Cardiovascular:     Rate and Rhythm: Normal rate and regular rhythm.     Heart sounds: Normal heart sounds.  Pulmonary:     Effort: Pulmonary effort is normal. No respiratory distress.     Breath sounds: Normal breath sounds.  Musculoskeletal:        General: Normal range of motion.     Cervical back: Normal range of motion.  Skin:    General: Skin is warm and dry.  Neurological:     General: No focal deficit present.     Mental Status: He is alert and oriented to person, place, and time. Mental status is at baseline.  Psychiatric:        Mood and Affect: Mood normal.        Behavior: Behavior normal.        Thought Content: Thought content normal.        Judgment: Judgment normal.     Lab Results  Component Value Date   HGBA1C 8.0 (A) 03/31/2022   HGBA1C 7.8 (H) 01/27/2021   HGBA1C 6.8 (H) 09/09/2020    Lab Results  Component Value Date   CREATININE 0.73 03/31/2022   CREATININE 0.87 01/27/2021   CREATININE 0.83 09/09/2020    Lab Results  Component Value Date   WBC 3.9 (L) 03/31/2022   HGB 13.9 03/31/2022   HCT 41.8 03/31/2022   PLT 265.0 03/31/2022   GLUCOSE 105 (H) 03/31/2022   CHOL 190 03/31/2022   TRIG 248.0 (H) 03/31/2022   HDL 39.50 03/31/2022   LDLDIRECT 123.0 03/31/2022   LDLCALC 82 01/27/2021   ALT 22 03/31/2022   AST 16 03/31/2022   NA 138 03/31/2022   K 4.5 03/31/2022   CL 102 03/31/2022   CREATININE 0.73 03/31/2022    BUN 11 03/31/2022   CO2 28 03/31/2022   TSH 0.98 03/31/2022   PSA 0.61 03/31/2022   INR 1.0 03/21/2020   HGBA1C 8.0 (A) 03/31/2022   MICROALBUR <0.7 03/31/2022    No results found.  Assessment &  Plan:  .Colon cancer screening  Essential hypertension  Uncontrolled type 2 diabetes mellitus with hyperglycemia ([facility])  Mixed hyperlipidemia     I provided 30 minutes of face-to-face time during this encounter reviewing patient's last visit with me, patient's  most recent visit with cardiology,  nephrology,  and neurology,  recent surgical and non surgical procedures, previous  labs and imaging studies, counseling on currently addressed issues,  and post visit ordering to diagnostics and therapeutics .   Follow-up: No follow-ups on file.   Sherlene Shams, MD

## 2022-09-06 NOTE — Assessment & Plan Note (Signed)
No warning signs on exam DTRS normal and strength normal.  Back extension exercises demonstrated and advised to do them 3 times daily.  Refer to PT if no improvement by next visit

## 2022-09-06 NOTE — Assessment & Plan Note (Signed)
Not at goal.  Increase carvedilol to 6.25 mg.  Continue 80 mg telmisartan

## 2022-09-06 NOTE — Assessment & Plan Note (Signed)
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  At  his last visit he was advised to increase Ozempic dose to 1.0 mg weekly, continue 10 units of Novolog tid and 45 units of tresiba .  There has been no change to the barriers to improved control.  He remains unable to provide data in a consistent  manner.  Given his elevated postprandial reports I have advised him  to increase his mealtime dose to 15 units prior to his largest meal of the day, d continue 10 units at the other meals and 45 units of basal insul, .

## 2022-09-07 ENCOUNTER — Other Ambulatory Visit: Payer: Commercial Managed Care - PPO

## 2022-09-07 ENCOUNTER — Other Ambulatory Visit: Payer: Self-pay

## 2022-09-07 MED FILL — Atorvastatin Calcium Tab 40 MG (Base Equivalent): ORAL | 90 days supply | Qty: 90 | Fill #1 | Status: AC

## 2022-10-08 ENCOUNTER — Telehealth: Payer: Self-pay

## 2022-10-08 NOTE — Progress Notes (Signed)
   Care Guide Note  10/08/2022 Name: Randy Grant MRN: [id number] DOB: May 04, 1966  Referred by: Crecencio Mc, MD Reason for referral : Care Coordination (Outreach to schedule with Pharm d New MM DM)   Randy Grant is a 57 y.o. year old male who is a primary care patient of Derrel Nip, Aris Everts, MD. Randy Grant was referred to the pharmacist for assistance related to DM.    An unsuccessful telephone outreach was attempted today to contact the patient who was referred to the pharmacy team for assistance with medication management. Additional attempts will be made to contact the patient.   Noreene Larsson, [place], [place] [address] Direct Dial: [phone] [email or web address]

## 2022-10-20 ENCOUNTER — Other Ambulatory Visit: Payer: Self-pay

## 2022-10-21 ENCOUNTER — Other Ambulatory Visit: Payer: Self-pay

## 2022-10-21 NOTE — Progress Notes (Signed)
   Care Guide Note  10/21/2022 Name: DAVONTAE PRUSINSKI MRN: [id number] DOB: 06-Aug-1966  Referred by: Crecencio Mc, MD Reason for referral : Care Coordination (Outreach to schedule with Pharm d New MM DM)   Taden BRYKER FLETCHALL is a 57 y.o. year old male who is a primary care patient of Derrel Nip, Aris Everts, MD. Brigitte Pulse was referred to the pharmacist for assistance related to DM.    A second unsuccessful telephone outreach was attempted today to contact the patient who was referred to the pharmacy team for assistance with medication management. Additional attempts will be made to contact the patient.  Noreene Larsson, [place], [place] [address] Direct Dial: [phone] [email or web address]

## 2022-11-16 NOTE — Progress Notes (Signed)
  Care Management   Outreach Note  11/16/2022 Name: Randy Grant MRN: [id number] DOB: 09/27/1965  Third unsuccessful telephone outreach was attempted today to contact the patient about Chronic Care Management needs.The patient was referred to the case management team by the provider who initiated CCM services. The provider has been notified of our unsuccessful attempts to make or maintain contact with the patient.   Follow Up Plan:  We have been unable to make contact with the patient for follow up. The care management team is available to follow up with the patient after provider conversation with the patient regarding recommendation for care management engagement and subsequent re-referral to the care management team.   Penne Lash, RMA Care Guide [facility]  [place], [place] [address] Direct Dial: [phone] [email or web address]

## 2022-11-23 ENCOUNTER — Other Ambulatory Visit: Payer: Self-pay

## 2022-11-23 ENCOUNTER — Other Ambulatory Visit: Payer: Self-pay | Admitting: Internal Medicine

## 2022-11-23 MED ORDER — OZEMPIC (1 MG/DOSE) 4 MG/3ML ~~LOC~~ SOPN
1.0000 mg | PEN_INJECTOR | SUBCUTANEOUS | 2 refills | Status: DC
  Filled 2022-11-23: qty 3, 28d supply, fill #0
  Filled 2023-01-22: qty 3, 28d supply, fill #1
  Filled 2023-03-02: qty 3, 28d supply, fill #2

## 2022-11-23 MED ORDER — ATORVASTATIN CALCIUM 40 MG PO TABS
40.0000 mg | ORAL_TABLET | Freq: Every day | ORAL | 1 refills | Status: DC
  Filled 2022-11-23: qty 90, 90d supply, fill #0
  Filled 2023-03-02: qty 90, 90d supply, fill #1

## 2022-11-23 MED FILL — Pantoprazole Sodium EC Tab 40 MG (Base Equiv): ORAL | 90 days supply | Qty: 90 | Fill #1 | Status: AC

## 2022-11-23 MED FILL — Fenofibrate Tab 160 MG: ORAL | 90 days supply | Qty: 90 | Fill #1 | Status: CN

## 2023-01-22 ENCOUNTER — Other Ambulatory Visit: Payer: Self-pay

## 2023-03-02 ENCOUNTER — Other Ambulatory Visit: Payer: Self-pay

## 2023-03-02 ENCOUNTER — Other Ambulatory Visit: Payer: Self-pay | Admitting: Internal Medicine

## 2023-03-02 MED ORDER — TELMISARTAN 80 MG PO TABS
80.0000 mg | ORAL_TABLET | Freq: Every day | ORAL | 1 refills | Status: DC
  Filled 2023-03-02 – 2023-05-18 (×2): qty 90, 90d supply, fill #0
  Filled 2023-09-03: qty 90, 90d supply, fill #1

## 2023-03-05 ENCOUNTER — Ambulatory Visit: Payer: Commercial Managed Care - PPO | Admitting: Internal Medicine

## 2023-03-18 ENCOUNTER — Other Ambulatory Visit: Payer: Self-pay

## 2023-05-13 ENCOUNTER — Other Ambulatory Visit: Payer: Self-pay

## 2023-05-13 ENCOUNTER — Other Ambulatory Visit: Payer: Self-pay | Admitting: Internal Medicine

## 2023-05-13 DIAGNOSIS — E782 Mixed hyperlipidemia: Secondary | ICD-10-CM

## 2023-05-16 ENCOUNTER — Other Ambulatory Visit: Payer: Self-pay

## 2023-05-17 ENCOUNTER — Other Ambulatory Visit: Payer: Self-pay

## 2023-05-17 MED FILL — Ezetimibe Tab 10 MG: ORAL | 90 days supply | Qty: 90 | Fill #0 | Status: AC

## 2023-05-17 MED FILL — Carvedilol Tab 6.25 MG: ORAL | 90 days supply | Qty: 180 | Fill #0 | Status: AC

## 2023-05-17 MED FILL — Pantoprazole Sodium EC Tab 40 MG (Base Equiv): ORAL | 90 days supply | Qty: 90 | Fill #0 | Status: AC

## 2023-05-17 MED FILL — Atorvastatin Calcium Tab 40 MG (Base Equivalent): ORAL | 90 days supply | Qty: 90 | Fill #0 | Status: AC

## 2023-05-18 ENCOUNTER — Other Ambulatory Visit: Payer: Self-pay

## 2023-05-19 ENCOUNTER — Other Ambulatory Visit: Payer: Self-pay

## 2023-09-03 ENCOUNTER — Other Ambulatory Visit: Payer: Self-pay

## 2023-09-03 ENCOUNTER — Other Ambulatory Visit: Payer: Self-pay | Admitting: Internal Medicine

## 2023-09-03 DIAGNOSIS — E782 Mixed hyperlipidemia: Secondary | ICD-10-CM

## 2023-09-05 ENCOUNTER — Other Ambulatory Visit: Payer: Self-pay

## 2023-09-06 ENCOUNTER — Other Ambulatory Visit: Payer: Self-pay

## 2023-09-06 ENCOUNTER — Other Ambulatory Visit: Payer: Self-pay | Admitting: Internal Medicine

## 2023-09-06 DIAGNOSIS — E119 Type 2 diabetes mellitus without complications: Secondary | ICD-10-CM

## 2023-09-06 MED ORDER — INSULIN LISPRO (1 UNIT DIAL) 100 UNIT/ML (KWIKPEN)
PEN_INJECTOR | SUBCUTANEOUS | 0 refills | Status: DC
  Filled 2023-09-06: qty 30, 66d supply, fill #0

## 2023-11-02 ENCOUNTER — Encounter: Payer: Self-pay | Admitting: *Deleted

## 2024-01-21 ENCOUNTER — Telehealth: Payer: Self-pay

## 2024-01-21 DIAGNOSIS — E119 Type 2 diabetes mellitus without complications: Secondary | ICD-10-CM

## 2024-01-21 DIAGNOSIS — E782 Mixed hyperlipidemia: Secondary | ICD-10-CM

## 2024-01-21 NOTE — Telephone Encounter (Signed)
 Copied from CRM [id number]. Topic: Clinical - Medication Question >> Jan 20, 2024  4:53 PM Baldo Levan wrote: Reason for CRM: Patient called in stating he is almost out of all his medications. Patient was last seen 09/04/2022, patient is currently scheduled for the first available appointment and put on the wait list. Patient is asking for refills in the mean time to get him to this appointment. Patient was unable to provide the names of the medications just stated all of them.

## 2024-01-24 ENCOUNTER — Other Ambulatory Visit: Payer: Self-pay

## 2024-01-24 MED ORDER — TELMISARTAN 80 MG PO TABS
80.0000 mg | ORAL_TABLET | Freq: Every day | ORAL | 0 refills | Status: DC
  Filled 2024-01-24: qty 30, 30d supply, fill #0

## 2024-01-24 MED ORDER — ATORVASTATIN CALCIUM 40 MG PO TABS
40.0000 mg | ORAL_TABLET | Freq: Every day | ORAL | 0 refills | Status: DC
  Filled 2024-01-24: qty 30, 30d supply, fill #0

## 2024-01-24 MED ORDER — PANTOPRAZOLE SODIUM 40 MG PO TBEC
DELAYED_RELEASE_TABLET | Freq: Every day | ORAL | 0 refills | Status: DC
  Filled 2024-01-24: qty 30, 30d supply, fill #0

## 2024-01-24 MED ORDER — EZETIMIBE 10 MG PO TABS
ORAL_TABLET | Freq: Every day | ORAL | 0 refills | Status: DC
  Filled 2024-01-24: qty 30, 30d supply, fill #0

## 2024-01-24 MED ORDER — INSULIN LISPRO (1 UNIT DIAL) 100 UNIT/ML (KWIKPEN)
PEN_INJECTOR | SUBCUTANEOUS | 0 refills | Status: DC
  Filled 2024-01-24: qty 30, 66d supply, fill #0

## 2024-01-24 MED ORDER — CARVEDILOL 6.25 MG PO TABS
6.2500 mg | ORAL_TABLET | Freq: Two times a day (BID) | ORAL | 0 refills | Status: DC
  Filled 2024-01-24 (×2): qty 60, 30d supply, fill #0

## 2024-01-24 NOTE — Telephone Encounter (Signed)
Refilled for 30 days only ?

## 2024-01-24 NOTE — Addendum Note (Signed)
 Addended by: Chadwick Colonel on: 01/24/2024 11:28 AM   Modules accepted: Orders

## 2024-02-20 ENCOUNTER — Other Ambulatory Visit: Payer: Self-pay | Admitting: Internal Medicine

## 2024-02-20 DIAGNOSIS — E782 Mixed hyperlipidemia: Secondary | ICD-10-CM

## 2024-02-21 ENCOUNTER — Other Ambulatory Visit: Payer: Self-pay

## 2024-02-21 ENCOUNTER — Other Ambulatory Visit: Payer: Self-pay | Admitting: Internal Medicine

## 2024-02-21 DIAGNOSIS — E782 Mixed hyperlipidemia: Secondary | ICD-10-CM

## 2024-02-22 ENCOUNTER — Other Ambulatory Visit: Payer: Self-pay

## 2024-03-07 ENCOUNTER — Ambulatory Visit: Admitting: Internal Medicine

## 2024-03-13 ENCOUNTER — Ambulatory Visit: Admitting: Nurse Practitioner

## 2024-03-13 ENCOUNTER — Encounter: Payer: Self-pay | Admitting: Internal Medicine

## 2024-05-09 ENCOUNTER — Telehealth: Payer: Self-pay

## 2024-05-09 ENCOUNTER — Other Ambulatory Visit: Payer: Self-pay

## 2024-05-09 DIAGNOSIS — E119 Type 2 diabetes mellitus without complications: Secondary | ICD-10-CM

## 2024-05-09 DIAGNOSIS — E782 Mixed hyperlipidemia: Secondary | ICD-10-CM

## 2024-05-09 MED ORDER — INSULIN LISPRO (1 UNIT DIAL) 100 UNIT/ML (KWIKPEN)
15.0000 [IU] | PEN_INJECTOR | Freq: Three times a day (TID) | SUBCUTANEOUS | 0 refills | Status: DC
  Filled 2024-05-09: qty 30, 67d supply, fill #0

## 2024-05-09 MED ORDER — TELMISARTAN 80 MG PO TABS
80.0000 mg | ORAL_TABLET | Freq: Every day | ORAL | 0 refills | Status: DC
  Filled 2024-05-09: qty 30, 30d supply, fill #0

## 2024-05-09 MED ORDER — FENOFIBRATE 160 MG PO TABS
160.0000 mg | ORAL_TABLET | Freq: Every day | ORAL | 0 refills | Status: DC
  Filled 2024-05-09: qty 30, 30d supply, fill #0

## 2024-05-09 MED ORDER — ATORVASTATIN CALCIUM 40 MG PO TABS
40.0000 mg | ORAL_TABLET | Freq: Every day | ORAL | 0 refills | Status: DC
  Filled 2024-05-09: qty 30, 30d supply, fill #0

## 2024-05-09 MED ORDER — CARVEDILOL 6.25 MG PO TABS
6.2500 mg | ORAL_TABLET | Freq: Two times a day (BID) | ORAL | 0 refills | Status: DC
  Filled 2024-05-09: qty 60, 30d supply, fill #0

## 2024-05-09 MED ORDER — PANTOPRAZOLE SODIUM 40 MG PO TBEC
40.0000 mg | DELAYED_RELEASE_TABLET | Freq: Every day | ORAL | 0 refills | Status: DC
  Filled 2024-05-09: qty 30, 30d supply, fill #0

## 2024-05-09 MED ORDER — EZETIMIBE 10 MG PO TABS
10.0000 mg | ORAL_TABLET | Freq: Every day | ORAL | 0 refills | Status: DC
  Filled 2024-05-09: qty 30, 30d supply, fill #0

## 2024-05-09 NOTE — Telephone Encounter (Signed)
 Copied from CRM #[id number]. Topic: Clinical - Prescription Issue >> May 09, 2024  4:12 PM Rea ORN wrote: Reason for CRM: Pt would like Randy Grant to call him back regarding medication. He did not specify what question is. Please call after 2 pm because he works nights.

## 2024-05-09 NOTE — Telephone Encounter (Signed)
 Medications have been refilled for 30 days and pt is aware that he must keep his appt in order to keep getting his medications refilled. Pt gave a verbal understanding.

## 2024-05-09 NOTE — Addendum Note (Signed)
 Addended by: HARRIETTE RAISIN on: 05/09/2024 05:26 PM   Modules accepted: Orders

## 2024-05-09 NOTE — Telephone Encounter (Signed)
 Spoke with pt and scheduled him a follow up on 05/24/2024. Pt has not been seen since 08/2022. Pt is requesting to have all his medications refilled. Is it okay to fill them enough to get him to his next appt?

## 2024-05-10 ENCOUNTER — Other Ambulatory Visit: Payer: Self-pay

## 2024-05-17 ENCOUNTER — Other Ambulatory Visit: Payer: Self-pay

## 2024-05-25 ENCOUNTER — Ambulatory Visit (INDEPENDENT_AMBULATORY_CARE_PROVIDER_SITE_OTHER): Admitting: Internal Medicine

## 2024-05-25 ENCOUNTER — Other Ambulatory Visit: Payer: Self-pay

## 2024-05-25 ENCOUNTER — Encounter: Payer: Self-pay | Admitting: Internal Medicine

## 2024-05-25 VITALS — BP 174/92 | HR 76 | Ht 65.0 in | Wt 157.8 lb

## 2024-05-25 DIAGNOSIS — E782 Mixed hyperlipidemia: Secondary | ICD-10-CM | POA: Diagnosis not present

## 2024-05-25 DIAGNOSIS — I10 Essential (primary) hypertension: Secondary | ICD-10-CM | POA: Diagnosis not present

## 2024-05-25 DIAGNOSIS — E785 Hyperlipidemia, unspecified: Secondary | ICD-10-CM | POA: Diagnosis not present

## 2024-05-25 DIAGNOSIS — E119 Type 2 diabetes mellitus without complications: Secondary | ICD-10-CM | POA: Diagnosis not present

## 2024-05-25 DIAGNOSIS — Z79899 Other long term (current) drug therapy: Secondary | ICD-10-CM | POA: Diagnosis not present

## 2024-05-25 DIAGNOSIS — Z794 Long term (current) use of insulin: Secondary | ICD-10-CM

## 2024-05-25 DIAGNOSIS — Z125 Encounter for screening for malignant neoplasm of prostate: Secondary | ICD-10-CM | POA: Diagnosis not present

## 2024-05-25 DIAGNOSIS — E1165 Type 2 diabetes mellitus with hyperglycemia: Secondary | ICD-10-CM

## 2024-05-25 DIAGNOSIS — Z91199 Patient's noncompliance with other medical treatment and regimen due to unspecified reason: Secondary | ICD-10-CM

## 2024-05-25 LAB — CBC WITH DIFFERENTIAL/PLATELET
Basophils Absolute: 0 K/uL (ref 0.0–0.1)
Basophils Relative: 0.8 % (ref 0.0–3.0)
Eosinophils Absolute: 0.1 K/uL (ref 0.0–0.7)
Eosinophils Relative: 2.4 % (ref 0.0–5.0)
HCT: 44.6 % (ref 39.0–52.0)
Hemoglobin: 14.7 g/dL (ref 13.0–17.0)
Lymphocytes Relative: 36.1 % (ref 12.0–46.0)
Lymphs Abs: 1.9 K/uL (ref 0.7–4.0)
MCHC: 32.9 g/dL (ref 30.0–36.0)
MCV: 81.3 fl (ref 78.0–100.0)
Monocytes Absolute: 0.3 K/uL (ref 0.1–1.0)
Monocytes Relative: 6.1 % (ref 3.0–12.0)
Neutro Abs: 2.9 K/uL (ref 1.4–7.7)
Neutrophils Relative %: 54.6 % (ref 43.0–77.0)
Platelets: 249 K/uL (ref 150.0–400.0)
RBC: 5.48 Mil/uL (ref 4.22–5.81)
RDW: 14.6 % (ref 11.5–15.5)
WBC: 5.3 K/uL (ref 4.0–10.5)

## 2024-05-25 LAB — COMPREHENSIVE METABOLIC PANEL WITH GFR
ALT: 18 U/L (ref 0–53)
AST: 16 U/L (ref 0–37)
Albumin: 4.1 g/dL (ref 3.5–5.2)
Alkaline Phosphatase: 77 U/L (ref 39–117)
BUN: 12 mg/dL (ref 6–23)
CO2: 27 meq/L (ref 19–32)
Calcium: 8.9 mg/dL (ref 8.4–10.5)
Chloride: 98 meq/L (ref 96–112)
Creatinine, Ser: 0.69 mg/dL (ref 0.40–1.50)
GFR: 100.8 mL/min (ref >60.00)
Glucose, Bld: 326 mg/dL — ABNORMAL HIGH (ref 70–99)
Potassium: 4.6 meq/L (ref 3.5–5.1)
Sodium: 133 meq/L — ABNORMAL LOW (ref 135–145)
Total Bilirubin: 0.5 mg/dL (ref 0.2–1.2)
Total Protein: 6.6 g/dL (ref 6.0–8.3)

## 2024-05-25 LAB — POCT GLYCOSYLATED HEMOGLOBIN (HGB A1C): Hemoglobin A1C: 14.1 % — AB (ref 4.0–5.6)

## 2024-05-25 LAB — LIPID PANEL
Cholesterol: 199 mg/dL (ref 0–200)
HDL: 49 mg/dL (ref >39.00)
LDL Cholesterol: 123 mg/dL — ABNORMAL HIGH (ref 0–99)
NonHDL: 149.58
Total CHOL/HDL Ratio: 4
Triglycerides: 131 mg/dL (ref 0.0–149.0)
VLDL: 26.2 mg/dL (ref 0.0–40.0)

## 2024-05-25 LAB — MICROALBUMIN / CREATININE URINE RATIO
Creatinine,U: 93.2 mg/dL
Microalb Creat Ratio: 53.6 mg/g — ABNORMAL HIGH (ref 0.0–30.0)
Microalb, Ur: 5 mg/dL — ABNORMAL HIGH (ref 0.0–1.9)

## 2024-05-25 LAB — TSH: TSH: 1.41 u[IU]/mL (ref 0.35–5.50)

## 2024-05-25 LAB — PSA: PSA: 0.69 ng/mL (ref 0.10–4.00)

## 2024-05-25 LAB — LDL CHOLESTEROL, DIRECT: Direct LDL: 134 mg/dL

## 2024-05-25 MED ORDER — FENOFIBRATE 160 MG PO TABS
160.0000 mg | ORAL_TABLET | Freq: Every day | ORAL | 1 refills | Status: AC
  Filled 2024-05-25 – 2024-07-05 (×2): qty 90, 90d supply, fill #0

## 2024-05-25 MED ORDER — EZETIMIBE 10 MG PO TABS
10.0000 mg | ORAL_TABLET | Freq: Every day | ORAL | 1 refills | Status: AC
  Filled 2024-05-25 – 2024-07-05 (×2): qty 90, 90d supply, fill #0

## 2024-05-25 MED ORDER — TELMISARTAN 80 MG PO TABS
80.0000 mg | ORAL_TABLET | Freq: Every day | ORAL | 1 refills | Status: AC
  Filled 2024-05-25 – 2024-07-05 (×2): qty 90, 90d supply, fill #0

## 2024-05-25 MED ORDER — INSULIN GLARGINE-YFGN 100 UNIT/ML ~~LOC~~ SOPN
45.0000 [IU] | PEN_INJECTOR | Freq: Every day | SUBCUTANEOUS | 3 refills | Status: DC
  Filled 2024-05-25: qty 18, 40d supply, fill #0
  Filled 2024-07-05: qty 18, 40d supply, fill #1

## 2024-05-25 MED ORDER — PANTOPRAZOLE SODIUM 40 MG PO TBEC
40.0000 mg | DELAYED_RELEASE_TABLET | Freq: Every day | ORAL | 1 refills | Status: AC
  Filled 2024-05-25 – 2024-07-05 (×2): qty 90, 90d supply, fill #0

## 2024-05-25 MED ORDER — INSULIN LISPRO (1 UNIT DIAL) 100 UNIT/ML (KWIKPEN)
15.0000 [IU] | PEN_INJECTOR | Freq: Three times a day (TID) | SUBCUTANEOUS | 0 refills | Status: DC
  Filled 2024-05-25 – 2024-07-01 (×5): qty 30, 67d supply, fill #0

## 2024-05-25 MED ORDER — CARVEDILOL 6.25 MG PO TABS
6.2500 mg | ORAL_TABLET | Freq: Two times a day (BID) | ORAL | 1 refills | Status: DC
  Filled 2024-05-25 – 2024-06-29 (×2): qty 180, 90d supply, fill #0

## 2024-05-25 MED ORDER — AMLODIPINE BESYLATE 2.5 MG PO TABS
2.5000 mg | ORAL_TABLET | Freq: Every day | ORAL | 1 refills | Status: AC
  Filled 2024-05-25: qty 90, 90d supply, fill #0

## 2024-05-25 NOTE — Patient Instructions (Addendum)
 YOUR DIABETES AND YOUR BLOOD PRESSURE ARE OUT OF CONTROL.  IF YOU DON'T START TAKING BETTER CARE OF YOURSELF YOU ARE GOING TO HAVE  STROKES,  HEART ATTACKS AND KIDNEY FAILURE  Please resume Semglee  Insulin   45 units daily  Continue humalog   INSULIN  AS WELL.  15 units three times daily  YOU NEED TO WEAR THE FREESTYLE LIBRE MONITOR SO I CAN REVIEW YOUR BLOOD SUGARS   I WILL NEED TO SEE YOU ONCE A MONTH UNTIL YOUR BLOOD SUGARS ARE UNDER CONTROL  I AM ADDING AMLODIPINE 2.5 MG TO YOUR CURRENT REGIMEN OF TELMISARTAN  AND CARVEDILOL 

## 2024-05-25 NOTE — Progress Notes (Signed)
 Subjective:  Patient ID: Randy Grant, male    DOB: [birth year]  Age: 58 y.o. MRN: [id number]  CC: The primary encounter diagnosis was Essential hypertension. Diagnoses of Uncontrolled type 2 diabetes mellitus with hyperglycemia (HCC), Mixed hyperlipidemia, Prostate cancer screening, Long-term use of high-risk medication, Type 2 diabetes mellitus (HCC), Hyperlipidemia LDL goal <70, and Noncompliance with diabetes treatment were also pertinent to this visit.   HPI Zamari E Stratton presents for  Chief Complaint  Patient presents with   Medical Management of Chronic Issues    Diabetes follow up     Patient has been lost to follow up,  last seen Jan 2024.LAST TIME HIS LABS WERE DONE  WAS  AUGUST 2023 , so  courtesy refills were  repeatedly extended until July 2025 .   Currently taking atorvastatin , carvedilol , zetia , tricor , humalog , and telmisartan   family in [place]  been very worried about him   1) HTN:  he took his medications one hour ago (telmisartan  and carvedilol )and has not been checking his BP at home   2) uncontrolled diabetes mellitus has been taking 15 units humalog  three times daily but no basal insulin  .  Not checking sugars  even though he has a [employer] . Turned it off because it was beeping too much   Lab Results  Component Value Date   HGBA1C 14.1 (A) 05/25/2024      Outpatient Medications Prior to Visit  Medication Sig Dispense Refill   ACCU-CHEK GUIDE test strip USE AS INSTRUCTED 100 strip 5   aspirin  81 MG tablet Take 81 mg by mouth daily.     B-D ULTRAFINE III SHORT PEN 31G X 8 MM MISC TEST BLOOD SUGARS 2 TIMES DAILY 100 each 6   BAYER MICROLET LANCETS lancets USE AS DIRECTED 100 each 5   glucose blood (ONE TOUCH ULTRA TEST) test strip Use as instructed three times daily to check blood sugars 100 each 12   ibuprofen  (ADVIL ) 600 MG tablet Take 1 tablet (600 mg total) by mouth every 6 (six) hours as needed. 30 tablet 0   Insulin  Pen Needle  (UNIFINE PENTIPS) 31G X 8 MM MISC test blood sugars 2 times a day 100 each 5   ipratropium (ATROVENT ) 0.06 % nasal spray Place 2 sprays into both nostrils 4 (four) times daily. 15 mL 0   Multiple Vitamin (MULTIVITAMIN) tablet Take 1 tablet by mouth daily.     Semaglutide , 1 MG/DOSE, (OZEMPIC , 1 MG/DOSE,) 4 MG/3ML SOPN Inject 1 mg as directed once a week. 3 mL 2   atorvastatin  (LIPITOR) 40 MG tablet Take 1 tablet (40 mg total) by mouth daily. 30 tablet 0   carvedilol  (COREG ) 6.25 MG tablet Take 1 tablet (6.25 mg total) by mouth 2 (two) times daily with a meal. 60 tablet 0   ezetimibe  (ZETIA ) 10 MG tablet Take 1 tablet (10 mg total) by mouth daily. 30 tablet 0   fenofibrate  160 MG tablet Take 1 tablet (160 mg total) by mouth at bedtime. 30 tablet 0   insulin  glargine (SEMGLEE ) 100 UNIT/ML Solostar Pen Inject 45 Units into the skin daily. 42 mL 3   insulin  lispro (HUMALOG  KWIKPEN) 100 UNIT/ML KwikPen Inject 15 Units into the skin 3 (three) times daily before meals. 30 mL 0   pantoprazole  (PROTONIX ) 40 MG tablet Take 1 tablet (40 mg total) by mouth daily. 30 tablet 0   telmisartan  (MICARDIS ) 80 MG tablet Take 1 tablet (80 mg total) by mouth daily. 30 tablet  0   Continuous Blood Gluc Receiver (FREESTYLE LIBRE 2 READER) DEVI Use to check BS 4 times daily 6 each 3   Continuous Blood Gluc Sensor (FREESTYLE LIBRE 2 SENSOR) MISC Use to check blood sugars 4 times daily 6 each 3   promethazine -dextromethorphan (PROMETHAZINE -DM) 6.25-15 MG/5ML syrup Take 5 mLs by mouth 4 (four) times daily as needed for cough. 118 mL 0   No facility-administered medications prior to visit.    Review of Systems;  Patient denies headache, fevers, malaise, unintentional weight loss, skin rash, eye pain, sinus congestion and sinus pain, sore throat, dysphagia,  hemoptysis , cough, dyspnea, wheezing, chest pain, palpitations, orthopnea, edema, abdominal pain, nausea, melena, diarrhea, constipation, flank pain, dysuria, hematuria,  urinary  Frequency, nocturia, numbness, tingling, seizures,  Focal weakness, Loss of consciousness,  Tremor, insomnia, depression, anxiety, and suicidal ideation.      Objective:  BP (!) 174/92   Pulse 76   Ht 5' 5 (1.651 m)   Wt 157 lb 12.8 oz (71.6 kg)   SpO2 99%   BMI 26.26 kg/m   BP Readings from Last 3 Encounters:  05/25/24 (!) 174/92  09/04/22 (!) 140/78  06/19/22 (!) 145/81    Wt Readings from Last 3 Encounters:  05/25/24 157 lb 12.8 oz (71.6 kg)  09/04/22 162 lb 8 oz (73.7 kg)  03/31/22 164 lb 6.4 oz (74.6 kg)    Physical Exam Vitals reviewed.  Constitutional:      General: He is not in acute distress.    Appearance: Normal appearance. He is normal weight. He is not ill-appearing, toxic-appearing or diaphoretic.  HENT:     Head: Normocephalic.  Eyes:     General: No scleral icterus.       Right eye: No discharge.        Left eye: No discharge.     Conjunctiva/sclera: Conjunctivae normal.  Cardiovascular:     Rate and Rhythm: Normal rate and regular rhythm.     Heart sounds: Normal heart sounds.  Pulmonary:     Effort: Pulmonary effort is normal. No respiratory distress.     Breath sounds: Normal breath sounds.  Musculoskeletal:        General: Normal range of motion.     Cervical back: Normal range of motion.  Skin:    General: Skin is warm and dry.  Neurological:     General: No focal deficit present.     Mental Status: He is alert and oriented to person, place, and time. Mental status is at baseline.  Psychiatric:        Mood and Affect: Mood normal.        Behavior: Behavior normal.        Thought Content: Thought content normal.        Judgment: Judgment normal.     Lab Results  Component Value Date   HGBA1C 14.1 (A) 05/25/2024   HGBA1C 8.0 (A) 03/31/2022   HGBA1C 7.8 (H) 01/27/2021    Lab Results  Component Value Date   CREATININE 0.69 05/25/2024   CREATININE 0.73 03/31/2022   CREATININE 0.87 01/27/2021    Lab Results  Component  Value Date   WBC 5.3 05/25/2024   HGB 14.7 05/25/2024   HCT 44.6 05/25/2024   PLT 249.0 05/25/2024   GLUCOSE 326 (H) 05/25/2024   CHOL 199 05/25/2024   TRIG 131.0 05/25/2024   HDL 49.00 05/25/2024   LDLDIRECT 134.0 05/25/2024   LDLCALC 123 (H) 05/25/2024   ALT 18 05/25/2024  AST 16 05/25/2024   NA 133 (L) 05/25/2024   K 4.6 05/25/2024   CL 98 05/25/2024   CREATININE 0.69 05/25/2024   BUN 12 05/25/2024   CO2 27 05/25/2024   TSH 1.41 05/25/2024   PSA 0.69 05/25/2024   INR 1.0 03/21/2020   HGBA1C 14.1 (A) 05/25/2024   MICROALBUR 5.0 (H) 05/25/2024    No results found.  Assessment & Plan:  .Essential hypertension Assessment & Plan: Not at goal on carvedilol  6.25 mg and 80 mg telmisartan ..  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  There has been no change to the barriers to improved control.  He remains unable to provide data in a consistent  manner.  Given his elevated postprandial reports I have advised him  to  resume basal insulin   at 45 units daily  and return in two weeks after wearig a CBG monitor   Orders: -     Comprehensive metabolic panel with GFR -     Microalbumin / creatinine urine ratio -     POCT glycosylated hemoglobin (Hb A1C) -     Insulin  Glargine-yfgn; Inject 45 Units into the skin daily.  Dispense: 42 mL; Refill: 3  Mixed hyperlipidemia Assessment & Plan: He is prescribed  atorvastatin   40 mg, zetia  and fenofibrate  and current LDL is 123  .  Will increase atorvastatin   to 80  mg daily    Lab Results  Component Value Date   CHOL 199 05/25/2024   HDL 49.00 05/25/2024   LDLCALC 123 (H) 05/25/2024   LDLDIRECT 134.0  05/25/2024   TRIG 131.0 05/25/2024   CHOLHDL 4 05/25/2024      Orders: -     Lipid panel -     LDL cholesterol, direct -     Ezetimibe ; Take 1 tablet (10 mg total) by mouth daily.  Dispense: 90 tablet; Refill: 1  Prostate cancer screening -     PSA  Long-term use of high-risk medication -     CBC with Differential/Platelet -     TSH  Type 2 diabetes mellitus (HCC) -     Insulin  Lispro (1 Unit Dial ); Inject 15 Units into the skin 3 (three) times daily before meals.  Dispense: 30 mL; Refill: 0  Hyperlipidemia LDL goal <70 Assessment & Plan: He is prescribed  atorvastatin   40 mg, zetia  and fenofibrate  and current LDL is 123  .  Will increase atorvastatin   to 80  mg daily    Lab Results  Component Value Date   CHOL 199 05/25/2024   HDL 49.00 05/25/2024   LDLCALC 123 (H) 05/25/2024   LDLDIRECT 134.0 05/25/2024   TRIG 131.0 05/25/2024   CHOLHDL 4 05/25/2024       Noncompliance with diabetes treatment Assessment & Plan: Secondary to family stressors and increased work load. He has stoppped taking basal insulin  and has been lost to follow up.  counselling given   Other orders -     Carvedilol ; Take 1 tablet (6.25 mg total) by mouth 2 (two) times daily with a meal.  Dispense: 180 tablet; Refill: 1 -     Fenofibrate ; Take 1 tablet (  160 mg total) by mouth at bedtime.  Dispense: 90 tablet; Refill: 1 -     Pantoprazole  Sodium; Take 1 tablet (40 mg total) by mouth daily.  Dispense: 90 tablet; Refill: 1 -     Telmisartan ; Take 1 tablet (80 mg total) by mouth daily.  Dispense: 90 tablet; Refill: 1 -     amLODIPine Besylate; Take 1 tablet (2.5 mg total) by mouth daily.  Dispense: 90 tablet; Refill: 1 -     Atorvastatin  Calcium ; Take 1 tablet (80 mg total) by mouth daily.  Dispense: 3090 tablet; Refill: 1   I personally spent a total of 34 minutes in the care of the patient today including getting/reviewing separately obtained history, performing a medically appropriate  exam/evaluation, counseling and educating, placing orders, documenting clinical information in the EHR, independently interpreting results, and communicating results.  Follow-up: Return in about 4 weeks (around 06/22/2024) for follow up diabetes.   Verneita LITTIE Kettering, MD

## 2024-05-26 ENCOUNTER — Other Ambulatory Visit: Payer: Self-pay

## 2024-05-27 MED ORDER — ATORVASTATIN CALCIUM 80 MG PO TABS
80.0000 mg | ORAL_TABLET | Freq: Every day | ORAL | 1 refills | Status: AC
  Filled 2024-05-27: qty 90, 90d supply, fill #0

## 2024-05-27 NOTE — Assessment & Plan Note (Signed)
 He is prescribed  atorvastatin   40 mg, zetia  and fenofibrate  and current LDL is 123  .  Will increase atorvastatin   to 80  mg daily    Lab Results  Component Value Date   CHOL 199 05/25/2024   HDL 49.00 05/25/2024   LDLCALC 123 (H) 05/25/2024   LDLDIRECT 134.0 05/25/2024   TRIG 131.0 05/25/2024   CHOLHDL 4 05/25/2024

## 2024-05-27 NOTE — Assessment & Plan Note (Addendum)
 Not at goal on carvedilol  6.25 mg and 80 mg telmisartan ..  Adding amlodipien 2.5 mg daily

## 2024-05-27 NOTE — Assessment & Plan Note (Signed)
 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  There has been no change to the barriers to improved control.  He remains unable to provide data in a consistent  manner.  Given his elevated postprandial reports I have advised him  to  resume basal insulin   at 45 units daily  and return in two weeks after wearig a CBG monitor

## 2024-05-27 NOTE — Assessment & Plan Note (Addendum)
 Secondary to family stressors and increased work load. He has stoppped taking basal insulin  and has been lost to follow up.  counselling given

## 2024-05-28 ENCOUNTER — Ambulatory Visit: Payer: Self-pay | Admitting: Internal Medicine

## 2024-05-28 ENCOUNTER — Other Ambulatory Visit: Payer: Self-pay

## 2024-06-26 ENCOUNTER — Ambulatory Visit: Admitting: Internal Medicine

## 2024-06-29 ENCOUNTER — Other Ambulatory Visit: Payer: Self-pay

## 2024-07-01 ENCOUNTER — Other Ambulatory Visit: Payer: Self-pay

## 2024-07-05 ENCOUNTER — Other Ambulatory Visit: Payer: Self-pay

## 2024-07-08 ENCOUNTER — Other Ambulatory Visit: Payer: Self-pay

## 2024-07-10 ENCOUNTER — Other Ambulatory Visit: Payer: Self-pay

## 2024-07-10 MED ORDER — INSULIN DEGLUDEC 100 UNIT/ML ~~LOC~~ SOPN
45.0000 [IU] | PEN_INJECTOR | Freq: Every day | SUBCUTANEOUS | 3 refills | Status: DC
  Filled 2024-07-10: qty 42, 90d supply, fill #0
  Filled 2024-09-06: qty 42, 90d supply, fill #1
  Filled ????-??-??: fill #1

## 2024-07-10 NOTE — Telephone Encounter (Signed)
 Medication has been pended since its a new medication.

## 2024-07-19 DIAGNOSIS — H5213 Myopia, bilateral: Secondary | ICD-10-CM | POA: Diagnosis not present

## 2024-07-19 LAB — OPHTHALMOLOGY REPORT-SCANNED

## 2024-07-24 ENCOUNTER — Telehealth: Payer: Self-pay

## 2024-07-24 ENCOUNTER — Other Ambulatory Visit: Payer: Self-pay

## 2024-07-24 ENCOUNTER — Encounter: Payer: Self-pay | Admitting: Internal Medicine

## 2024-07-24 ENCOUNTER — Ambulatory Visit: Admitting: Internal Medicine

## 2024-07-24 VITALS — BP 138/74 | HR 96 | Ht 65.0 in | Wt 170.0 lb

## 2024-07-24 DIAGNOSIS — E785 Hyperlipidemia, unspecified: Secondary | ICD-10-CM | POA: Diagnosis not present

## 2024-07-24 DIAGNOSIS — Z7984 Long term (current) use of oral hypoglycemic drugs: Secondary | ICD-10-CM

## 2024-07-24 DIAGNOSIS — E119 Type 2 diabetes mellitus without complications: Secondary | ICD-10-CM

## 2024-07-24 DIAGNOSIS — Z91199 Patient's noncompliance with other medical treatment and regimen due to unspecified reason: Secondary | ICD-10-CM

## 2024-07-24 DIAGNOSIS — I10 Essential (primary) hypertension: Secondary | ICD-10-CM

## 2024-07-24 MED ORDER — METOPROLOL TARTRATE 50 MG PO TABS
50.0000 mg | ORAL_TABLET | Freq: Two times a day (BID) | ORAL | 3 refills | Status: AC
  Filled 2024-07-24: qty 180, 90d supply, fill #0

## 2024-07-24 MED ORDER — INSULIN LISPRO (1 UNIT DIAL) 100 UNIT/ML (KWIKPEN)
15.0000 [IU] | PEN_INJECTOR | Freq: Three times a day (TID) | SUBCUTANEOUS | 0 refills | Status: AC
  Filled 2024-07-24: qty 30, 67d supply, fill #0

## 2024-07-24 NOTE — Progress Notes (Unsigned)
 Subjective:  Patient ID: Randy Grant, male    DOB: 1965-08-17  Age: 58 y.o. MRN: [id number]  CC: The primary encounter diagnosis was Essential hypertension. Diagnoses of Type 2 diabetes mellitus (HCC), Hyperlipidemia LDL goal <70, and Noncompliance with diabetes treatment were also pertinent to this visit.   HPI Sarkis E Lipari presents for  Chief Complaint  Patient presents with   Medical Management of Chronic Issues   1) uncontrolled type 2 Diabetes.  Randy Grant is a very pleasant 58 yr old former smoker  who was seen in October after medication refills were denied due to him being lost to follow up for  over one year.  A1c was 14.1  patient had stopped his basal insulin  and was s taking only Novolog . He was not checking blood sugars despite having FreeStyle Libre monitors covered by his insurance.  In October he was prescribed basal insulin  45 units daily  to add to daily regimen of novolog   15 units three times daily with meals.  He was advised to resume wearing the FreeStyle Cade 3 monitor for data collection .and scheduled to return after one month., however he returns today after 2 months, without CBG monitor and no recent CBG data.  He states that he is taking both insulins as directed .   he continues to defer ophthalmology follow up despite known retinopathy noted in Jan 2021 eye exam by [facility] due to an outstanding balance..   2) he reports a sensation of strong heart beat that is felt in his neck on the right which he  attributes  to carvedilol  which was prescribed during his 2021 hospitalization for chest pain. He states that the feeling resolved during a temporary suspension of the medication,  but he has resumed the medication.  He denies chdst pain   3) CAD:  reviewed August 2021 hospitalization for chest pain.  Myoview  and ECHO were done after admission EKG noted a bibfasicular block. SABRA He was referred to cardiology as outpatient but deferred .   He is taking a beta  blocker ,  statin and asa.   Outpatient Medications Prior to Visit  Medication Sig Dispense Refill   ACCU-CHEK GUIDE test strip USE AS INSTRUCTED 100 strip 5   amLODipine  (NORVASC ) 2.5 MG tablet Take 1 tablet (2.5 mg total) by mouth daily. 90 tablet 1   aspirin  81 MG tablet Take 81 mg by mouth daily.     atorvastatin  (LIPITOR) 80 MG tablet Take 1 tablet (80 mg total) by mouth daily. 3090 tablet 1   B-D ULTRAFINE III SHORT PEN 31G X 8 MM MISC TEST BLOOD SUGARS 2 TIMES DAILY 100 each 6   BAYER MICROLET LANCETS lancets USE AS DIRECTED 100 each 5   ezetimibe  (ZETIA ) 10 MG tablet Take 1 tablet (10 mg total) by mouth daily. 90 tablet 1   fenofibrate  160 MG tablet Take 1 tablet (160 mg total) by mouth at bedtime. 90 tablet 1   glucose blood (ONE TOUCH ULTRA TEST) test strip Use as instructed three times daily to check blood sugars 100 each 12   ibuprofen  (ADVIL ) 600 MG tablet Take 1 tablet (600 mg total) by mouth every 6 (six) hours as needed. 30 tablet 0   insulin  degludec (TRESIBA ) 100 UNIT/ML FlexTouch Pen Inject 45 Units into the skin daily. 42 mL 3   Insulin  Pen Needle (UNIFINE PENTIPS) 31G X 8 MM MISC test blood sugars 2 times a day 100 each 5   ipratropium (ATROVENT )  0.06 % nasal spray Place 2 sprays into both nostrils 4 (four) times daily. 15 mL 0   Multiple Vitamin (MULTIVITAMIN) tablet Take 1 tablet by mouth daily.     pantoprazole  (PROTONIX ) 40 MG tablet Take 1 tablet (40 mg total) by mouth daily. 90 tablet 1   telmisartan  (MICARDIS ) 80 MG tablet Take 1 tablet (80 mg total) by mouth daily. 90 tablet 1   carvedilol  (COREG ) 6.25 MG tablet Take 1 tablet (6.25 mg total) by mouth 2 (two) times daily with a meal. 180 tablet 1   insulin  lispro (HUMALOG  KWIKPEN) 100 UNIT/ML KwikPen Inject 15 Units into the skin 3 (three) times daily before meals. 30 mL 0   Semaglutide , 1 MG/DOSE, (OZEMPIC , 1 MG/DOSE,) 4 MG/3ML SOPN Inject 1 mg as directed once a week. (Patient not taking: Reported on 07/24/2024) 3  mL 2   No facility-administered medications prior to visit.    Review of Systems;  Patient denies headache, fevers, malaise, unintentional weight loss, skin rash, eye pain, sinus congestion and sinus pain, sore throat, dysphagia,  hemoptysis , cough, dyspnea, wheezing, chest pain, palpitations, orthopnea, edema, abdominal pain, nausea, melena, diarrhea, constipation, flank pain, dysuria, hematuria, urinary  Frequency, nocturia, numbness, tingling, seizures,  Focal weakness, Loss of consciousness,  Tremor, insomnia, depression, anxiety, and suicidal ideation.      Objective:  BP 138/74   Pulse 96   Ht 5' 5 (1.651 m)   Wt 170 lb (77.1 kg)   SpO2 99%   BMI 28.29 kg/m   BP Readings from Last 3 Encounters:  07/24/24 138/74  05/25/24 (!) 174/92  09/04/22 (!) 140/78    Wt Readings from Last 3 Encounters:  07/24/24 170 lb (77.1 kg)  05/25/24 157 lb 12.8 oz (71.6 kg)  09/04/22 162 lb 8 oz (73.7 kg)    Physical Exam  Lab Results  Component Value Date   HGBA1C 14.1 (A) 05/25/2024   HGBA1C 8.0 (A) 03/31/2022   HGBA1C 7.8 (H) 01/27/2021    Lab Results  Component Value Date   CREATININE 0.69 05/25/2024   CREATININE 0.73 03/31/2022   CREATININE 0.87 01/27/2021    Lab Results  Component Value Date   WBC 5.3 05/25/2024   HGB 14.7 05/25/2024   HCT 44.6 05/25/2024   PLT 249.0 05/25/2024   GLUCOSE 326 (H) 05/25/2024   CHOL 199 05/25/2024   TRIG 131.0 05/25/2024   HDL 49.00 05/25/2024   LDLDIRECT 134.0 05/25/2024   LDLCALC 123 (H) 05/25/2024   ALT 18 05/25/2024   AST 16 05/25/2024   NA 133 (L) 05/25/2024   K 4.6 05/25/2024   CL 98 05/25/2024   CREATININE 0.69 05/25/2024   BUN 12 05/25/2024   CO2 27 05/25/2024   TSH 1.41 05/25/2024   PSA 0.69 05/25/2024   INR 1.0 03/21/2020   HGBA1C 14.1 (A) 05/25/2024   MICROALBUR 5.0 (H) 05/25/2024    No results found.  Assessment & Plan:  .Essential hypertension Assessment & Plan: Finally at goal with 3 drug therapy:   carvedilol  6.25 mg ,  80 mg telmisartan .and  amlodipine  2.5 mg daily .  Changing carvedilol  to metoprolol  given patient c/o neck pain with carvedilol .  UaCr in October was > 30 but he had not been taking telmisatan.   will repeat in December and add Jardiance  or Farxiga if ratio remains >30   Lab Results  Component Value Date   MICROALBUR 5.0 (H) 05/25/2024        Type 2 diabetes mellitus (HCC) -  Insulin  Lispro (1 Unit Dial ); Inject 15 Units into the skin 3 (three) times daily before meals.  Dispense: 30 mL; Refill: 0 -     Ambulatory referral to Endocrinology  Hyperlipidemia LDL goal <70 Assessment & Plan: Atorvastatin  dose was increased to 80 mg for goal LD < 70. SABRA  Continue  zetia  and fenofibrate  as well.  Repeat labs needed in January   Lab Results  Component Value Date   CHOL 199 05/25/2024   HDL 49.00 05/25/2024   LDLCALC 123 (H) 05/25/2024   LDLDIRECT 134.0 05/25/2024   TRIG 131.0 05/25/2024   CHOLHDL 4 05/25/2024       Noncompliance with diabetes treatment Assessment & Plan: He was lost to follow up for over a year, which he attributed to  family stressors and increased work load. He denies depressive symptoms.  He has been  employed by [employer] full time and has access to medications . We were unable to place a CBG monitor on him today because he did not know the password to his I phone so the app could not be reloaded.  I advised him that I cannot safely adjust his insulin  without data .   counselling given and referral to Endocrinology made.     Other orders -     Metoprolol  Tartrate; Take 1 tablet (50 mg total) by mouth 2 (two) times daily.  Dispense: 180 tablet; Refill: 3     I spent 34 minutes on the day of this face to face encounter reviewing patient's  most recent  labs and imaging studies, counseling on diabetes management,  reviewing the assessment and plan with patient, and post visit ordering and reviewing of  diagnostics and therapeutics with  patient  .   Follow-up: Return in about 5 weeks (around 08/28/2024) for follow up diabetes.   Verneita LITTIE Kettering, MD

## 2024-07-24 NOTE — Patient Instructions (Addendum)
°  I have discontinued the carvedilol  based on your report of side effects today and have added metoprolol  50 mg twice daily instead  Continue telmisartan  as well  Continue 45 units of tresiba  ONCE DAILY and 15 units of humalog  3 times daily before meals  I will review your blood sugars in 2 weeks and make medication changes based on the data collected by the FREESTYLE LIBRE MONITOR   Referral to endocrinology under way to help get your diabetes under better control   Return for labs and office visit in one month (after jan 16)

## 2024-07-24 NOTE — Telephone Encounter (Signed)
 Error

## 2024-07-25 NOTE — Assessment & Plan Note (Signed)
 Finally at goal with 3 drug therapy:  carvedilol  6.25 mg ,  80 mg telmisartan .and  amlodipine  2.5 mg daily .  Changing carvedilol  to metoprolol  given patient c/o neck pain with carvedilol .  UaCr in October was > 30 but he had not been taking telmisatan.   will repeat in December and add Jardiance  or Farxiga if ratio remains >30   Lab Results  Component Value Date   MICROALBUR 5.0 (H) 05/25/2024

## 2024-07-25 NOTE — Assessment & Plan Note (Signed)
 Atorvastatin  dose was increased to 80 mg for goal LD < 70. SABRA  Continue  zetia  and fenofibrate  as well.  Repeat labs needed in January   Lab Results  Component Value Date   CHOL 199 05/25/2024   HDL 49.00 05/25/2024   LDLCALC 123 (H) 05/25/2024   LDLDIRECT 134.0 05/25/2024   TRIG 131.0 05/25/2024   CHOLHDL 4 05/25/2024

## 2024-07-25 NOTE — Assessment & Plan Note (Signed)
 He was lost to follow up for over a year, which he attributed to  family stressors and increased work load. He denies depressive symptoms.  He has been  employed by [employer] full time and has access to medications . We were unable to place a CBG monitor on him today because he did not know the password to his I phone so the app could not be reloaded.  I advised him that I cannot safely adjust his insulin  without data .   counselling given and referral to Endocrinology made.

## 2024-08-01 ENCOUNTER — Telehealth: Payer: Self-pay | Admitting: Internal Medicine

## 2024-08-01 NOTE — Telephone Encounter (Signed)
 Copied from CRM #[id number]. Topic: General - Other >> Aug 01, 2024  2:53 PM Eva FALCON wrote: Reason for CRM: Pt requesting to speak to Harlene in regards to his meter for his sugar. No other information given. Please call patient [phone].

## 2024-08-02 ENCOUNTER — Other Ambulatory Visit: Payer: Self-pay | Admitting: Internal Medicine

## 2024-08-02 ENCOUNTER — Other Ambulatory Visit: Payer: Self-pay

## 2024-08-04 ENCOUNTER — Other Ambulatory Visit: Payer: Self-pay

## 2024-08-04 MED ORDER — UNIFINE PENTIPS 31G X 8 MM MISC
5 refills | Status: AC
  Filled 2024-08-04: qty 100, 30d supply, fill #0

## 2024-08-04 NOTE — Telephone Encounter (Signed)
 Lvm for pt to return call. Need to know what questions pt has in regards to his meter. Okay to relay

## 2024-08-04 NOTE — Telephone Encounter (Unsigned)
 Copied from CRM #[id number]. Topic: General - Other >> Aug 01, 2024  2:53 PM Eva FALCON wrote: Reason for CRM: Pt requesting to speak to Harlene in regards to his meter for his sugar. No other information given. Please call patient [phone]. >> Aug 04, 2024 11:14 AM Rea ORN wrote: Pt stated he was told by Harlene that he can have remote monitoring for his blood glucose machine from the office. Pt stated he was told to call the clinic back to get that set up. Please call back , [phone]

## 2024-08-04 NOTE — Telephone Encounter (Signed)
 Lvm for pt. Pt needs appt on the nursing schedule for CGM placing. Please schedule pt when he calls back.

## 2024-08-09 NOTE — Telephone Encounter (Signed)
 Detailed vm left informing pt that he can give us  a call back to get scheduled on the nurses schedule for CGM placement   E2C2 PLEASE RELAY TO PT AND HAVE HIM SCHEDULED ON THE NURSES SCHEDULE

## 2024-08-14 ENCOUNTER — Other Ambulatory Visit: Payer: Self-pay

## 2024-08-14 ENCOUNTER — Ambulatory Visit

## 2024-08-14 DIAGNOSIS — E1165 Type 2 diabetes mellitus with hyperglycemia: Secondary | ICD-10-CM

## 2024-08-14 MED ORDER — FREESTYLE LIBRE 3 SENSOR MISC
5 refills | Status: AC
  Filled 2024-08-14: qty 2, 28d supply, fill #0
  Filled 2024-09-06: qty 2, 28d supply, fill #1

## 2024-08-14 NOTE — Assessment & Plan Note (Addendum)
 I have downloaded and reviewed the data from patient's continuous blood glucose monitor  for  the period  of   Dec 22 to Jan 4     .  Patient's  sugars have been  IN RANGE 28    % OF THE TIME,   ABOVE RANGE72    % of the time.  Average blood glucose is  234   with a variability of   32%    .  Patient's medications currently used for glycemic control are  Tresiba   45 units daily and Humalog  15 units   tid.  If patient has been compliant with this regimen,  he was advised vy staff to  INCREASE THE tresiba   TO 50 UNITS DAILY AND THE LUNCHTIME AND DINNERTIME DOSES OF Humalog  TO 20 UNITS.  CONTIUE 15 UNITS AT BREAKFAST

## 2024-08-14 NOTE — Telephone Encounter (Signed)
 Spoke with pt and informed him of his readings and the medication changes Dr. Marylynn has advised. Pt stated it back to me with understanding. He also stated that he is scheduled to see Endo next Monday.

## 2024-08-14 NOTE — Telephone Encounter (Signed)
 Pt called on 12/31 and scheduled an appt for CGM placement due to miscommunication.   While pt was here today, I connected his phone to our practice and downloaded his readings for the last 2 weeks.  Report placed in Dr Lula folder & pt was advised that we will contact him once report has been reviewed and she will send in a prescription if she wants him to continue on the May 3.  Pt uses [facility] pharmacy (and Rx pended below)  NOTE: Today is his 14th day of the [place] 3. He is now due to replace it.

## 2024-08-17 ENCOUNTER — Other Ambulatory Visit: Payer: Self-pay

## 2024-08-21 ENCOUNTER — Other Ambulatory Visit: Payer: Self-pay

## 2024-08-21 ENCOUNTER — Ambulatory Visit: Admitting: Internal Medicine

## 2024-08-21 MED ORDER — MOUNJARO 5 MG/0.5ML ~~LOC~~ SOAJ
0.5000 mL | SUBCUTANEOUS | 3 refills | Status: AC
  Filled 2024-08-21: qty 6, 84d supply, fill #0

## 2024-08-21 MED ORDER — MOUNJARO 2.5 MG/0.5ML ~~LOC~~ SOAJ
0.5000 mL | SUBCUTANEOUS | 0 refills | Status: AC
  Filled 2024-08-21: qty 2, 28d supply, fill #0

## 2024-08-21 MED ORDER — BAQSIMI ONE PACK 3 MG/DOSE NA POWD
1.0000 | Freq: Once | NASAL | 3 refills | Status: AC
  Filled 2024-08-21: qty 2, 2d supply, fill #0

## 2024-08-21 MED ORDER — FREESTYLE LIBRE 3 PLUS SENSOR MISC
11 refills | Status: AC
  Filled 2024-08-21: qty 2, 30d supply, fill #0

## 2024-08-21 MED ORDER — JARDIANCE 25 MG PO TABS
25.0000 mg | ORAL_TABLET | Freq: Every day | ORAL | 1 refills | Status: AC
  Filled 2024-08-21: qty 30, 30d supply, fill #0

## 2024-08-28 ENCOUNTER — Other Ambulatory Visit: Payer: Self-pay

## 2024-09-06 ENCOUNTER — Other Ambulatory Visit: Payer: Self-pay | Admitting: Internal Medicine

## 2024-09-06 ENCOUNTER — Other Ambulatory Visit: Payer: Self-pay

## 2024-09-06 MED ORDER — TRESIBA FLEXTOUCH 100 UNIT/ML ~~LOC~~ SOPN
45.0000 [IU] | PEN_INJECTOR | Freq: Every day | SUBCUTANEOUS | 3 refills | Status: AC
  Filled 2024-09-06: qty 3, 4d supply, fill #0

## 2024-09-07 ENCOUNTER — Other Ambulatory Visit: Payer: Self-pay

## 2024-09-07 ENCOUNTER — Other Ambulatory Visit (HOSPITAL_COMMUNITY): Payer: Self-pay

## 2024-09-12 ENCOUNTER — Other Ambulatory Visit: Payer: Self-pay

## 2024-09-18 ENCOUNTER — Ambulatory Visit: Admitting: Internal Medicine
